# Patient Record
Sex: Female | Born: 1990 | Race: White | Hispanic: No | Marital: Single | Smoking: Current every day smoker
Health system: Southern US, Community
[De-identification: ages and names within clinical notes are randomized; demographics above are authoritative.]

## PROBLEM LIST (undated history)

## (undated) ENCOUNTER — Emergency Department (HOSPITAL_COMMUNITY): Admission: EM | Payer: Self-pay | Source: Home / Self Care

## (undated) DIAGNOSIS — K759 Inflammatory liver disease, unspecified: Secondary | ICD-10-CM

---
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
## 2010-07-09 LAB — WET PREP, GENITAL
Trich, Wet Prep: NONE SEEN
Yeast Wet Prep HPF POC: NONE SEEN

## 2010-07-09 LAB — GC/CHLAMYDIA PROBE AMP, GENITAL
Chlamydia, DNA Probe: NEGATIVE
GC Probe Amp, Genital: NEGATIVE

## 2010-07-10 LAB — AMYLASE: Amylase: 40 U/L (ref 27–131)

## 2010-07-10 LAB — CBC
HCT: 38.3 % (ref 36.0–46.0)
HCT: 43.2 % (ref 36.0–46.0)
Hemoglobin: 12.9 g/dL (ref 12.0–15.0)
Hemoglobin: 14.5 g/dL (ref 12.0–15.0)
MCHC: 33.6 g/dL (ref 30.0–36.0)
MCHC: 33.8 g/dL (ref 30.0–36.0)
MCV: 92.3 fL (ref 78.0–100.0)
RBC: 4.18 MIL/uL (ref 3.87–5.11)
RBC: 4.68 MIL/uL (ref 3.87–5.11)
RDW: 13.4 % (ref 11.5–15.5)

## 2010-07-10 LAB — POCT I-STAT, CHEM 8
Calcium, Ion: 1.09 mmol/L — ABNORMAL LOW (ref 1.12–1.32)
Chloride: 109 mEq/L (ref 96–112)
Glucose, Bld: 103 mg/dL — ABNORMAL HIGH (ref 70–99)
HCT: 45 % (ref 36.0–46.0)
Hemoglobin: 15.3 g/dL — ABNORMAL HIGH (ref 12.0–15.0)
TCO2: 21 mmol/L (ref 0–100)

## 2010-07-10 LAB — DIFFERENTIAL
Basophils Absolute: 0 10*3/uL (ref 0.0–0.1)
Basophils Relative: 0 % (ref 0–1)
Eosinophils Absolute: 0 10*3/uL (ref 0.0–0.7)
Eosinophils Relative: 0 % (ref 0–5)
Eosinophils Relative: 0 % (ref 0–5)
Lymphocytes Relative: 5 % — ABNORMAL LOW (ref 12–46)
Monocytes Absolute: 0.5 10*3/uL (ref 0.1–1.0)
Monocytes Absolute: 0.7 10*3/uL (ref 0.1–1.0)
Monocytes Relative: 3 % (ref 3–12)
Monocytes Relative: 3 % (ref 3–12)
Neutro Abs: 16.6 10*3/uL — ABNORMAL HIGH (ref 1.7–7.7)

## 2010-07-10 LAB — WET PREP, GENITAL
Trich, Wet Prep: NONE SEEN
WBC, Wet Prep HPF POC: NONE SEEN

## 2010-07-10 LAB — URINALYSIS, ROUTINE W REFLEX MICROSCOPIC
Bilirubin Urine: NEGATIVE
Ketones, ur: >80 mg/dL — AB
Nitrite: NEGATIVE
Protein, ur: 30 mg/dL — AB
pH: 6 (ref 5.0–8.0)

## 2010-07-10 LAB — HEPATIC FUNCTION PANEL
ALT: 10 U/L (ref 0–35)
AST: 20 U/L (ref 0–37)
Alkaline Phosphatase: 70 U/L (ref 39–117)
Indirect Bilirubin: 0.7 mg/dL (ref 0.3–0.9)
Total Protein: 8 g/dL (ref 6.0–8.3)

## 2010-07-10 LAB — POCT URINALYSIS DIP (DEVICE)
Glucose, UA: NEGATIVE mg/dL
Ketones, ur: >=160 mg/dL — AB
Specific Gravity, Urine: >=1.03 (ref 1.005–1.030)
Urobilinogen, UA: 1 mg/dL (ref 0.0–1.0)

## 2010-07-10 LAB — COMPREHENSIVE METABOLIC PANEL
ALT: 14 U/L (ref 0–35)
Alkaline Phosphatase: 65 U/L (ref 39–117)
BUN: 10 mg/dL (ref 6–23)
CO2: 22 mEq/L (ref 19–32)
GFR calc non Af Amer: >60 mL/min (ref >60)
Glucose, Bld: 89 mg/dL (ref 70–99)
Potassium: 3.7 mEq/L (ref 3.5–5.1)
Sodium: 144 mEq/L (ref 135–145)

## 2010-07-10 LAB — GC/CHLAMYDIA PROBE AMP, GENITAL
Chlamydia, DNA Probe: NEGATIVE
GC Probe Amp, Genital: NEGATIVE

## 2010-07-10 LAB — URINE MICROSCOPIC-ADD ON

## 2010-07-10 LAB — POCT PREGNANCY, URINE: Preg Test, Ur: NEGATIVE

## 2011-01-04 LAB — URINALYSIS, ROUTINE W REFLEX MICROSCOPIC
Bilirubin Urine: NEGATIVE
Ketones, ur: 15 mg/dL — AB
Nitrite: NEGATIVE
Protein, ur: NEGATIVE mg/dL
Urobilinogen, UA: 1 mg/dL (ref 0.0–1.0)

## 2011-01-04 LAB — POCT I-STAT, CHEM 8
Chloride: 104 mEq/L (ref 96–112)
Creatinine, Ser: 0.8 mg/dL (ref 0.4–1.2)
Hemoglobin: 15.3 g/dL (ref 12.0–16.0)
Potassium: 3.5 mEq/L (ref 3.5–5.1)
Sodium: 142 mEq/L (ref 135–145)

## 2011-01-04 LAB — URINE MICROSCOPIC-ADD ON

## 2011-05-15 ENCOUNTER — Emergency Department (HOSPITAL_COMMUNITY)
Admission: EM | Admit: 2011-05-15 | Discharge: 2011-05-15 | Disposition: A | Discharge status: Home / Self Care | Payer: Self-pay | Attending: Emergency Medicine | Admitting: Emergency Medicine

## 2011-05-15 ENCOUNTER — Encounter (HOSPITAL_COMMUNITY): Payer: Self-pay | Admitting: *Deleted

## 2011-05-15 DIAGNOSIS — R109 Unspecified abdominal pain: Secondary | ICD-10-CM | POA: Insufficient documentation

## 2011-05-15 DIAGNOSIS — R10819 Abdominal tenderness, unspecified site: Secondary | ICD-10-CM | POA: Insufficient documentation

## 2011-05-15 DIAGNOSIS — R197 Diarrhea, unspecified: Secondary | ICD-10-CM | POA: Insufficient documentation

## 2011-05-15 DIAGNOSIS — R112 Nausea with vomiting, unspecified: Secondary | ICD-10-CM | POA: Insufficient documentation

## 2011-05-15 LAB — URINALYSIS, ROUTINE W REFLEX MICROSCOPIC
Bilirubin Urine: NEGATIVE
Specific Gravity, Urine: 1.026 (ref 1.005–1.030)
pH: 5.5 (ref 5.0–8.0)

## 2011-05-15 LAB — BASIC METABOLIC PANEL
BUN: 12 mg/dL (ref 6–23)
CO2: 21 mEq/L (ref 19–32)
Chloride: 104 mEq/L (ref 96–112)
GFR calc Af Amer: >90 mL/min (ref >90)
Potassium: 3.8 mEq/L (ref 3.5–5.1)

## 2011-05-15 LAB — CBC
HCT: 39.2 % (ref 36.0–46.0)
Hemoglobin: 13.3 g/dL (ref 12.0–15.0)
MCHC: 33.9 g/dL (ref 30.0–36.0)
MCV: 92 fL (ref 78.0–100.0)
WBC: 14 10*3/uL — ABNORMAL HIGH (ref 4.0–10.5)

## 2011-05-15 LAB — URINE MICROSCOPIC-ADD ON

## 2011-05-15 LAB — DIFFERENTIAL
Lymphocytes Relative: 14 % (ref 12–46)
Monocytes Absolute: 0.6 10*3/uL (ref 0.1–1.0)
Monocytes Relative: 4 % (ref 3–12)
Neutro Abs: 11.2 10*3/uL — ABNORMAL HIGH (ref 1.7–7.7)

## 2011-05-15 MED ORDER — SODIUM CHLORIDE 0.9 % IV BOLUS (SEPSIS)
1000.0000 mL | Freq: Once | INTRAVENOUS | Status: AC
  Administered 2011-05-15: 1000 mL via INTRAVENOUS

## 2011-05-15 MED ORDER — ONDANSETRON HCL 4 MG/2ML IJ SOLN
4.0000 mg | Freq: Once | INTRAMUSCULAR | Status: AC
  Administered 2011-05-15: 4 mg via INTRAVENOUS
  Filled 2011-05-15: qty 2

## 2011-05-15 MED ORDER — OXYCODONE-ACETAMINOPHEN 5-325 MG PO TABS
1.0000 | ORAL_TABLET | Freq: Once | ORAL | Status: AC
  Administered 2011-05-15: 1 via ORAL
  Filled 2011-05-15: qty 1

## 2011-05-15 MED ORDER — KETOROLAC TROMETHAMINE 30 MG/ML IJ SOLN
30.0000 mg | Freq: Once | INTRAMUSCULAR | Status: AC
  Administered 2011-05-15: 30 mg via INTRAVENOUS
  Filled 2011-05-15: qty 1

## 2011-05-15 MED ORDER — ONDANSETRON HCL 4 MG PO TABS: 4.0000 mg | ORAL_TABLET | Freq: Four times a day (QID) | ORAL | Status: AC

## 2011-05-15 MED ORDER — ACETAMINOPHEN-CODEINE #3 300-30 MG PO TABS: 1.0000 | ORAL_TABLET | Freq: Four times a day (QID) | ORAL | Status: AC | PRN

## 2011-05-15 MED ORDER — IBUPROFEN 800 MG PO TABS: 800.0000 mg | ORAL_TABLET | Freq: Three times a day (TID) | ORAL | Status: AC

## 2011-05-15 NOTE — ED Notes (Signed)
Ambulatory to restroom

## 2011-05-15 NOTE — ED Notes (Signed)
Abd pain, NVD, acute onset this am. States symptoms similar and worse than adolescent dysmenorrhea

## 2011-05-15 NOTE — ED Provider Notes (Signed)
Medical screening examination/treatment/procedure(s) were performed by non-physician practitioner and as supervising physician I was immediately available for consultation/collaboration.   Vladislav Axelson, MD 05/15/11 1550 

## 2011-05-15 NOTE — ED Provider Notes (Signed)
History     CSN: [id number]  Arrival date & time 05/15/11  4098   First MD Initiated Contact with Patient 05/15/11 [phone]      Chief Complaint  Patient presents with  . Abdominal Pain    Abdominal pain radiating all over, patient pointing mainly to LUQ.  c/o "knot" to LUQ onset this am.  Patient states started menstrual cycle this am. NVD    (Consider location/radiation/quality/duration/timing/severity/associated sxs/prior treatment) HPI  Patient presents to ER complaining of acute onset lower abdominal cramping that woke her from her sleep at 4:30 am that was following by vomiting x 2 and one loose stool but ongoing lower abdominal cramping and the onset of her menstrual cycle this morning stating "I started my period a little later this morning." Patient states she has hx of similar abdominal cramping and vomiting past menstrual cycles but states "this cramping is more severe." denies point specific abdominal pain, fevers, chills, CP, SOB, dysuria, hematuria or blood in her stool. She denies vaginal d/c or pelvic pain. Patient took nothing for symptoms PTA. Denies aggravating or alleviating factors. She has no known medical problems and takes no meds on regular basis.   Past Medical History  Diagnosis Date  . Asthma     No past surgical history on file.  No family history on file.  History  Substance Use Topics  . Smoking status: Current Everyday Smoker -- 0.5 packs/day for 5 years    Types: Cigarettes  . Smokeless tobacco: Not on file  . Alcohol Use: No    OB History    Grav Para Term Preterm Abortions TAB SAB Ect Mult Living                  Review of Systems  All other systems reviewed and are negative.    Allergies  Review of patient's allergies indicates no known allergies.  Home Medications  No current outpatient prescriptions on file.  BP 114/58  Pulse 56  Temp(Src) 97.7 F (36.5 C) (Oral)  Resp 14  Ht 5\' 7"  (1.702 m)  Wt 140 lb (63.504 kg)  BMI  21.93 kg/m2  SpO2 100%  LMP 05/15/2011  Physical Exam  Nursing note and vitals reviewed. Constitutional: She is oriented to person, place, and time. She appears well-developed and well-nourished. No distress.  HENT:  Head: Normocephalic and atraumatic.  Eyes: Conjunctivae are normal.  Neck: Normal range of motion. Neck supple.  Cardiovascular: Normal rate, regular rhythm, normal heart sounds and intact distal pulses.  Exam reveals no gallop and no friction rub.   No murmur heard. Pulmonary/Chest: Effort normal and breath sounds normal. No respiratory distress. She has no wheezes. She has no rales. She exhibits no tenderness.  Abdominal: Bowel sounds are normal. She exhibits no distension and no mass. There is tenderness. There is no rebound and no guarding.       Mild TTP of entire lower abdomen but no guarding, rebound or rigidity.   Musculoskeletal: Normal range of motion. She exhibits no edema and no tenderness.  Neurological: She is alert and oriented to person, place, and time.  Skin: Skin is warm and dry. No rash noted. She is not diaphoretic. No erythema.  Psychiatric: She has a normal mood and affect.    ED Course  Procedures (including critical care time)  IV fluids, IV zofran and toradol.   Patient states she is feeling much better with some mild ongoing lower abdominal cramping but no point specific abdominal pain.  Labs Reviewed  CBC - Abnormal; Notable for the following:    WBC 14.0 (*)    All other components within normal limits  DIFFERENTIAL - Abnormal; Notable for the following:    Neutrophils Relative 81 (*)    Neutro Abs 11.2 (*)    All other components within normal limits  URINALYSIS, ROUTINE W REFLEX MICROSCOPIC - Abnormal; Notable for the following:    Hgb urine dipstick MODERATE (*)    Leukocytes, UA SMALL (*)    All other components within normal limits  URINE MICROSCOPIC-ADD ON - Abnormal; Notable for the following:    Squamous Epithelial / LPF FEW  (*)    Crystals CA OXALATE CRYSTALS (*)    All other components within normal limits  BASIC METABOLIC PANEL  POCT PREGNANCY, URINE   Patient is currently menstruating. Clean catch but question contamination based on UA  No results found.   1. Abdominal cramping   2. Nausea vomiting and diarrhea       MDM  Question of abdominal cramping with n/v/d related to menstruation vs gastroenteritis but patient is afebrile with an non acute, non tender abdomen. VSS. Non toxic appearing and tolerating fluids well. Spoke at length with patient about changing or worsening of symptoms that should warrant return to ER and she voices her understanding.         [place], [place] 05/15/11 1049

## 2011-05-15 NOTE — Discharge Instructions (Signed)
Take ibuprofen for pain, zofran for nausea and tylenol #3 for break through pain. Stay well hydrated with plenty of water throughout the day. Return to ER for changing or worsening of symptoms. Follow a bland diet for the next 48 hours.   Diet for Diarrhea, Adult Having frequent, runny stools (diarrhea) has many causes. Diarrhea may be caused or worsened by food or drink. Diarrhea may be relieved by changing your diet. IF YOU ARE NOT TOLERATING SOLID FOODS:  Drink enough water and fluids to keep your urine clear or pale yellow.   Avoid sugary drinks and sodas as well as milk-based beverages.   Avoid beverages containing caffeine and alcohol.   You may try rehydrating beverages. You can make your own by following this recipe:    tsp table salt.    tsp baking soda.   ? tsp salt substitute (potassium chloride).   1 tbs + 1 tsp sugar.   1 qt water.  As your stools become more solid, you can start eating solid foods. Add foods one at a time. If a certain food causes your diarrhea to get worse, avoid that food and try other foods. A low fiber, low-fat, and lactose-free diet is recommended. Small, frequent meals may be better tolerated.  Starches  Allowed:  White, [place], and pita breads, plain rolls, buns, bagels. Plain muffins, matzo. Soda, saltine, or graham crackers. Pretzels, melba toast, zwieback. Cooked cereals made with water: cornmeal, farina, cream cereals. Dry cereals: refined corn, wheat, rice. Potatoes prepared any way without skins, refined macaroni, spaghetti, noodles, refined rice.   Avoid:  Bread, rolls, or crackers made with whole wheat, multi-grains, rye, bran seeds, nuts, or coconut. Corn tortillas or taco shells. Cereals containing whole grains, multi-grains, bran, coconut, nuts, or raisins. Cooked or dry oatmeal. Coarse wheat cereals, granola. Cereals advertised as "high-fiber." Potato skins. Whole grain pasta, wild or brown rice. Popcorn. Sweet potatoes/yams. Sweet  rolls, doughnuts, waffles, pancakes, sweet breads.  Vegetables  Allowed: Strained tomato and vegetable juices. Most well-cooked and canned vegetables without seeds. Fresh: Tender lettuce, cucumber without the skin, cabbage, spinach, bean sprouts.   Avoid: Fresh, cooked, or canned: Artichokes, baked beans, beet greens, broccoli, Brussels sprouts, corn, kale, legumes, peas, sweet potatoes. Cooked: Green or red cabbage, spinach. Avoid large servings of any vegetables, because vegetables shrink when cooked, and they contain more fiber per serving than fresh vegetables.  Fruit  Allowed: All fruit juices except prune juice. Cooked or canned: Apricots, applesauce, cantaloupe, cherries, fruit cocktail, grapefruit, grapes, kiwi, mandarin oranges, peaches, pears, plums, watermelon. Fresh: Apples without skin, ripe banana, grapes, cantaloupe, cherries, grapefruit, peaches, oranges, plums. Keep servings limited to  cup or 1 piece.   Avoid: Fresh: Apple with skin, apricots, mango, pears, raspberries, strawberries. Prune juice, stewed or dried prunes. Dried fruits, raisins, dates. Large servings of all fresh fruits.  Meat and Meat Substitutes  Allowed: Ground or well-cooked tender beef, ham, veal, lamb, pork, or poultry. Eggs, plain cheese. Fish, oysters, shrimp, lobster, other seafoods. Liver, organ meats.   Avoid: Tough, fibrous meats with gristle. Peanut butter, smooth or chunky. Cheese, nuts, seeds, legumes, dried peas, beans, lentils.  Milk  Allowed: Yogurt, lactose-free milk, kefir, drinkable yogurt, buttermilk, soy milk.   Avoid: Milk, chocolate milk, beverages made with milk, such as milk shakes.  Soups  Allowed: Bouillon, broth, or soups made from allowed foods. Any strained soup.   Avoid: Soups made from vegetables that are not allowed, cream or milk-based soups.  Desserts and Sweets  Allowed: Sugar-free gelatin, sugar-free frozen ice pops made without sugar alcohol.   Avoid: Plain cakes  and cookies, pie made with allowed fruit, pudding, custard, cream pie. Gelatin, fruit, ice, sherbet, frozen ice pops. Ice cream, ice milk without nuts. Plain hard candy, honey, jelly, molasses, syrup, sugar, chocolate syrup, gumdrops, marshmallows.  Fats and Oils  Allowed: Avoid any fats and oils.   Avoid: Seeds, nuts, olives, avocados. Margarine, butter, cream, mayonnaise, salad oils, plain salad dressings made from allowed foods. Plain gravy, crisp bacon without rind.  Beverages  Allowed: Water, decaffeinated teas, oral rehydration solutions, sugar-free beverages.   Avoid: Fruit juices, caffeinated beverages (coffee, tea, soda or pop), alcohol, sports drinks, or lemon-lime soda or pop.  Condiments  Allowed: Ketchup, mustard, horseradish, vinegar, cream sauce, cheese sauce, cocoa powder. Spices in moderation: allspice, basil, bay leaves, celery powder or leaves, cinnamon, cumin powder, curry powder, ginger, mace, marjoram, onion or garlic powder, oregano, paprika, parsley flakes, ground pepper, rosemary, sage, savory, tarragon, thyme, turmeric.   Avoid: Coconut, honey.  Weight Monitoring: Weigh yourself every day. You should weigh yourself in the morning after you urinate and before you eat breakfast. Wear the same amount of clothing when you weigh yourself. Record your weight daily. Bring your recorded weights to your clinic visits. Tell your caregiver right away if you have gained 3 lb/1.4 kg or more in 1 day, 5 lb/2.3 kg in a week, or whatever amount you were told to report. SEEK IMMEDIATE MEDICAL CARE IF:   You are unable to keep fluids down.   You start to throw up (vomit) or diarrhea keeps coming back (persistent).   Abdominal pain develops, increases, or can be felt in one place (localizes).   You have an oral temperature above 102 F (38.9 C), not controlled by medicine.   Diarrhea contains blood or mucus.   You develop excessive weakness, dizziness, fainting, or extreme  thirst.  MAKE SURE YOU:   Understand these instructions.   Will watch your condition.   Will get help right away if you are not doing well or get worse.  Document Released: 06/08/2003 Document Revised: 11/28/2010 Document Reviewed: 09/29/2008 [facility] Patient Information 2012 [place], [place].  Abdominal Pain Abdominal pain can be caused by many things. Your caregiver decides the seriousness of your pain by an examination and possibly blood tests and X-rays. Many cases can be observed and treated at home. Most abdominal pain is not caused by a disease and will probably improve without treatment. However, in many cases, more time must pass before a clear cause of the pain can be found. Before that point, it may not be known if you need more testing, or if hospitalization or surgery is needed. HOME CARE INSTRUCTIONS   Do not take laxatives unless directed by your caregiver.   Take pain medicine only as directed by your caregiver.   Only take over-the-counter or prescription medicines for pain, discomfort, or fever as directed by your caregiver.   Try a clear liquid diet (broth, tea, or water) for as long as directed by your caregiver. Slowly move to a bland diet as tolerated.  SEEK IMMEDIATE MEDICAL CARE IF:   The pain does not go away.   You have a fever.   You keep throwing up (vomiting).   The pain is felt only in portions of the abdomen. Pain in the right side could possibly be appendicitis. In an adult, pain in the left lower portion of the abdomen could be colitis or diverticulitis.  You pass bloody or black tarry stools.  MAKE SURE YOU:   Understand these instructions.   Will watch your condition.   Will get help right away if you are not doing well or get worse.  Document Released: 12/26/2004 Document Revised: 11/28/2010 Document Reviewed: 11/04/2007 [facility] Patient Information 2012 [place], [place].

## 2011-08-01 ENCOUNTER — Emergency Department (HOSPITAL_COMMUNITY)
Admission: EM | Admit: 2011-08-01 | Discharge: 2011-08-01 | Discharge status: Against Medical Advice | Payer: Self-pay | Attending: Emergency Medicine | Admitting: Emergency Medicine

## 2011-08-01 ENCOUNTER — Encounter (HOSPITAL_COMMUNITY): Payer: Self-pay | Admitting: *Deleted

## 2011-08-01 DIAGNOSIS — R109 Unspecified abdominal pain: Secondary | ICD-10-CM | POA: Insufficient documentation

## 2011-08-01 NOTE — ED Notes (Signed)
Pt is here with lower mid abdominal cramping and nausea and vomiting since this am.  Pt vomiting in triage.

## 2011-11-01 ENCOUNTER — Encounter (HOSPITAL_COMMUNITY): Payer: Self-pay | Admitting: Emergency Medicine

## 2011-11-01 DIAGNOSIS — R109 Unspecified abdominal pain: Secondary | ICD-10-CM | POA: Insufficient documentation

## 2011-11-01 LAB — CBC
HCT: 39 % (ref 36.0–46.0)
MCV: 90.5 fL (ref 78.0–100.0)
RDW: 13.7 % (ref 11.5–15.5)
WBC: 15.4 10*3/uL — ABNORMAL HIGH (ref 4.0–10.5)

## 2011-11-01 LAB — URINALYSIS, ROUTINE W REFLEX MICROSCOPIC
Glucose, UA: NEGATIVE mg/dL
Hgb urine dipstick: NEGATIVE
Protein, ur: NEGATIVE mg/dL

## 2011-11-01 LAB — COMPREHENSIVE METABOLIC PANEL
BUN: 6 mg/dL (ref 6–23)
CO2: 26 mEq/L (ref 19–32)
Chloride: 103 mEq/L (ref 96–112)
Creatinine, Ser: 0.56 mg/dL (ref 0.50–1.10)
GFR calc Af Amer: >90 mL/min (ref >90)
GFR calc non Af Amer: >90 mL/min (ref >90)
Total Bilirubin: 0.4 mg/dL (ref 0.3–1.2)

## 2011-11-01 LAB — URINE MICROSCOPIC-ADD ON

## 2011-11-01 LAB — POCT PREGNANCY, URINE: Preg Test, Ur: NEGATIVE

## 2011-11-01 NOTE — ED Notes (Signed)
C/o abd pain, nausea, vomiting, and diarrhea since 3pm today.  Abd pressure when she urinates.

## 2011-11-02 ENCOUNTER — Emergency Department (HOSPITAL_COMMUNITY)
Admission: EM | Admit: 2011-11-02 | Discharge: 2011-11-02 | Discharge status: Against Medical Advice | Payer: Self-pay | Attending: Emergency Medicine | Admitting: Emergency Medicine

## 2011-11-02 NOTE — ED Notes (Signed)
Attempted to call pt again with no response.

## 2011-11-02 NOTE — ED Notes (Signed)
Called with no answer by [facility] EMT

## 2011-12-25 ENCOUNTER — Emergency Department (HOSPITAL_COMMUNITY)
Admission: EM | Admit: 2011-12-25 | Discharge: 2011-12-25 | Disposition: A | Discharge status: Home / Self Care | Payer: Self-pay | Attending: Emergency Medicine | Admitting: Emergency Medicine

## 2011-12-25 ENCOUNTER — Encounter (HOSPITAL_COMMUNITY): Payer: Self-pay | Admitting: Adult Health

## 2011-12-25 DIAGNOSIS — F17200 Nicotine dependence, unspecified, uncomplicated: Secondary | ICD-10-CM | POA: Insufficient documentation

## 2011-12-25 DIAGNOSIS — Y93G1 Activity, food preparation and clean up: Secondary | ICD-10-CM | POA: Insufficient documentation

## 2011-12-25 DIAGNOSIS — S61209A Unspecified open wound of unspecified finger without damage to nail, initial encounter: Secondary | ICD-10-CM | POA: Insufficient documentation

## 2011-12-25 DIAGNOSIS — J45909 Unspecified asthma, uncomplicated: Secondary | ICD-10-CM | POA: Insufficient documentation

## 2011-12-25 DIAGNOSIS — W268XXA Contact with other sharp object(s), not elsewhere classified, initial encounter: Secondary | ICD-10-CM | POA: Insufficient documentation

## 2011-12-25 DIAGNOSIS — IMO0002 Reserved for concepts with insufficient information to code with codable children: Secondary | ICD-10-CM

## 2011-12-25 NOTE — ED Notes (Signed)
Pt refused xray, MD informed stated pt was ok to refuse.

## 2011-12-25 NOTE — ED Notes (Signed)
1 inch laceration to left lateral pointer finger happened today while washing dishes. Tetanus up to date, bleeding controlled

## 2011-12-25 NOTE — ED Notes (Signed)
Pt washing dishes, cut hand on knife. Bleeding controlled. 1 in lac noted to left 2nd finger. Pt tetanus UTD

## 2011-12-25 NOTE — ED Provider Notes (Signed)
History  This chart was scribed for Glynn Octave, MD by Ardeen Jourdain. This patient was seen in room TR06C/TR06C and the patient's care was started at 1915.  CSN: [id number]  Arrival date & time 12/25/11  1805   None     Chief Complaint  Patient presents with  . Laceration     The history is provided by the patient. No language interpreter was used.    Kathryn Medina is a 21 y.o. female who presents to the Emergency Department complaining of a laceration to left lateral pointer finger. She states it happened today while she was cleaning dishes and broke a glass mug which cut her hand. The bleeding has stopped. She denies any other injuries. She states that her tetanus shot is up to date. She is a current everyday smoker but denies alcohol use.    Past Medical History  Diagnosis Date  . Asthma     History reviewed. No pertinent past surgical history.  History reviewed. No pertinent family history.  History  Substance Use Topics  . Smoking status: Current Every Day Smoker -- 0.5 packs/day for 5 years    Types: Cigarettes  . Smokeless tobacco: Not on file  . Alcohol Use: No   No OB history available.   Review of Systems  A complete 10 system review of systems was obtained and all systems are negative except as noted in the HPI and PMH.    Allergies  Review of patient's allergies indicates no known allergies.  Home Medications   Current Outpatient Rx  Name Route Sig Dispense Refill  . ACETAMINOPHEN 500 MG PO TABS Oral Take 500 mg by mouth every 6 (six) hours as needed. For pain      Triage Vitals: BP 100/51  Pulse 60  Temp 98.5 F (36.9 C) (Oral)  Resp 16  SpO2 97%  Physical Exam  Nursing note and vitals reviewed. Constitutional: She is oriented to person, place, and time. She appears well-developed and well-nourished. No distress.  HENT:  Head: Normocephalic and atraumatic.  Eyes: EOM are normal.  Neck: Normal range of motion. Neck supple. No  tracheal deviation present.  Cardiovascular: Normal rate.   Pulmonary/Chest: Effort normal. No respiratory distress.  Musculoskeletal: Normal range of motion.  Neurological: She is alert and oriented to person, place, and time.  Skin: Skin is warm and dry.       2 cm elipitcal laceration to radial index finger at MCP. FDS and FDP functional and intact. Distal sensation intact  Psychiatric: She has a normal mood and affect. Her behavior is normal.    ED Course  Procedures (including critical care time)  DIAGNOSTIC STUDIES: Oxygen Saturation is 97% on room air, adequate by my interpretation.    COORDINATION OF CARE:  31- Discussed treatment plan with pt at bedside and pt agreed to plan.   1934- Suture cart was ordered.      Labs Reviewed - No data to display No results found.   No diagnosis found.    MDM  Laceration to L radial index finger.  FDS, FDP, extension and distal sensation intact. Tetanus UTD.    Patient refuses Xray to evaluate for foreign body. She states there were "big chunks" of glass and they didn't get in wound.  Wound extensively probed and explored and no FBs found.  Wound repair, tetanus UTD.  LACERATION REPAIR Performed by: Glynn Octave Authorized by: Glynn Octave Consent: Verbal consent obtained. Risks and benefits: risks, benefits and alternatives were discussed  Consent given by: patient Patient identity confirmed: provided demographic data Prepped and Draped in normal sterile fashion Wound explored  Laceration Location: L index  Laceration Length: 2cm  No Foreign Bodies seen or palpated  Anesthesia: local infiltration  Local anesthetic: lidocaine 2% with epinephrine  Anesthetic total: 4 ml  Irrigation method: syringe Amount of cleaning: standard  Skin closure: 4-0 nylon  Number of sutures: 5  Technique: interrupted  Patient tolerance: Patient tolerated the procedure well with no immediate  complications.      I personally performed the services described in this documentation, which was scribed in my presence.  The recorded information has been reviewed and considered.    Glynn Octave, MD 12/25/11 2016

## 2011-12-25 NOTE — ED Notes (Signed)
Pt d/c home in NAD. Pt voiced understanding of d/c instructions and follow up care.  

## 2012-03-22 ENCOUNTER — Ambulatory Visit (HOSPITAL_COMMUNITY)
Admission: AD | Admit: 2012-03-22 | Discharge: 2012-03-22 | Disposition: A | Discharge status: Home / Self Care | Payer: Medicaid Other | Source: Ambulatory Visit | Attending: Obstetrics | Admitting: Obstetrics

## 2012-03-22 ENCOUNTER — Encounter (HOSPITAL_COMMUNITY): Payer: Self-pay

## 2012-03-22 ENCOUNTER — Inpatient Hospital Stay (HOSPITAL_COMMUNITY)
Admission: AD | Admit: 2012-03-22 | Discharge: 2012-03-22 | Disposition: A | Discharge status: Home / Self Care | Payer: Medicaid Other | Source: Ambulatory Visit | Attending: Obstetrics | Admitting: Obstetrics

## 2012-03-22 ENCOUNTER — Encounter (HOSPITAL_COMMUNITY): Payer: Self-pay | Admitting: *Deleted

## 2012-03-22 ENCOUNTER — Encounter (HOSPITAL_COMMUNITY): Payer: Self-pay | Admitting: Advanced Practice Midwife

## 2012-03-22 ENCOUNTER — Inpatient Hospital Stay (HOSPITAL_COMMUNITY): Payer: Medicaid Other

## 2012-03-22 ENCOUNTER — Ambulatory Visit: Admit: 2012-03-22 | Payer: Self-pay | Admitting: Obstetrics

## 2012-03-22 ENCOUNTER — Encounter (HOSPITAL_COMMUNITY)
Admission: AD | Disposition: A | Discharge status: Home / Self Care | Payer: Self-pay | Source: Ambulatory Visit | Attending: Obstetrics

## 2012-03-22 DIAGNOSIS — O021 Missed abortion: Secondary | ICD-10-CM | POA: Insufficient documentation

## 2012-03-22 DIAGNOSIS — IMO0002 Reserved for concepts with insufficient information to code with codable children: Secondary | ICD-10-CM

## 2012-03-22 DIAGNOSIS — O200 Threatened abortion: Secondary | ICD-10-CM

## 2012-03-22 HISTORY — PX: DILATION AND EVACUATION: SHX1459

## 2012-03-22 LAB — CBC
HCT: 35.2 % — ABNORMAL LOW (ref 36.0–46.0)
Hemoglobin: 11.9 g/dL — ABNORMAL LOW (ref 12.0–15.0)
MCH: 30.6 pg (ref 26.0–34.0)
MCH: 31.3 pg (ref 26.0–34.0)
MCHC: 33.8 g/dL (ref 30.0–36.0)
MCV: 91.3 fL (ref 78.0–100.0)
Platelets: 166 10*3/uL (ref 150–400)
RDW: 13.2 % (ref 11.5–15.5)

## 2012-03-22 LAB — ABO/RH: ABO/RH(D): O POS

## 2012-03-22 SURGERY — DILATION AND EVACUATION, UTERUS
Anesthesia: Monitor Anesthesia Care

## 2012-03-22 MED ORDER — CITRIC ACID-SODIUM CITRATE 334-500 MG/5ML PO SOLN
30.0000 mL | Freq: Once | ORAL | Status: AC
  Administered 2012-03-22: 30 mL via ORAL

## 2012-03-22 MED ORDER — KETOROLAC TROMETHAMINE 30 MG/ML IJ SOLN
30.0000 mg | Freq: Once | INTRAMUSCULAR | Status: DC
Start: 2012-03-22 — End: 2012-03-22

## 2012-03-22 MED ORDER — SODIUM CHLORIDE 0.9 % IR SOLN
Status: DC | PRN
  Administered 2012-03-22: 1000 mL

## 2012-03-22 MED ORDER — LACTATED RINGERS IV SOLN
INTRAVENOUS | Status: DC
Start: 2012-03-22 — End: 2012-03-22
  Administered 2012-03-22: 125 mL/h via INTRAVENOUS
  Administered 2012-03-22: 11:00:00 via INTRAVENOUS

## 2012-03-22 MED ORDER — ACETAMINOPHEN-CODEINE 300-30 MG PO TABS: 1.0000 | ORAL_TABLET | ORAL | Status: DC | PRN

## 2012-03-22 MED ORDER — LACTATED RINGERS IV SOLN: INTRAVENOUS | Status: DC

## 2012-03-22 MED ORDER — HYDROMORPHONE HCL PF 1 MG/ML IJ SOLN: 0.2000 mg | INTRAMUSCULAR | Status: DC | PRN

## 2012-03-22 MED ORDER — LIDOCAINE HCL 1 % IJ SOLN
INTRAMUSCULAR | Status: DC | PRN
  Administered 2012-03-22: 20 mL

## 2012-03-22 MED ORDER — FENTANYL CITRATE 0.05 MG/ML IJ SOLN
INTRAMUSCULAR | Status: DC | PRN
  Administered 2012-03-22: 25 ug via INTRAVENOUS

## 2012-03-22 MED ORDER — HYDROMORPHONE HCL PF 1 MG/ML IJ SOLN
1.0000 mg | Freq: Once | INTRAMUSCULAR | Status: AC
  Administered 2012-03-22: 1 mg via INTRAVENOUS
  Filled 2012-03-22: qty 1

## 2012-03-22 MED ORDER — ONDANSETRON HCL 4 MG/2ML IJ SOLN
INTRAMUSCULAR | Status: DC | PRN
  Administered 2012-03-22: 4 mg via INTRAVENOUS

## 2012-03-22 MED ORDER — CITRIC ACID-SODIUM CITRATE 334-500 MG/5ML PO SOLN
ORAL | Status: AC
  Administered 2012-03-22: 30 mL via ORAL
  Filled 2012-03-22: qty 15

## 2012-03-22 MED ORDER — METHYLERGONOVINE MALEATE 0.2 MG/ML IJ SOLN
INTRAMUSCULAR | Status: DC | PRN
  Administered 2012-03-22: 0.2 mg via INTRAMUSCULAR

## 2012-03-22 MED ORDER — LIDOCAINE HCL 1 % IJ SOLN
INTRAMUSCULAR | Status: DC | PRN
  Administered 2012-03-22: 10 mL

## 2012-03-22 MED ORDER — PROMETHAZINE HCL 12.5 MG PO TABS: 12.5000 mg | ORAL_TABLET | Freq: Four times a day (QID) | ORAL | Status: DC | PRN

## 2012-03-22 MED ORDER — IBUPROFEN 600 MG PO TABS: 600.0000 mg | ORAL_TABLET | Freq: Four times a day (QID) | ORAL | Status: DC | PRN

## 2012-03-22 MED ORDER — DEXAMETHASONE SODIUM PHOSPHATE 4 MG/ML IJ SOLN
INTRAMUSCULAR | Status: DC | PRN
  Administered 2012-03-22: 10 mg via INTRAVENOUS

## 2012-03-22 MED ORDER — PROMETHAZINE HCL 25 MG/ML IJ SOLN
12.5000 mg | Freq: Once | INTRAMUSCULAR | Status: AC
  Administered 2012-03-22: 12.5 mg via INTRAVENOUS
  Filled 2012-03-22: qty 1

## 2012-03-22 MED ORDER — FENTANYL CITRATE 0.05 MG/ML IJ SOLN: 25.0000 ug | INTRAMUSCULAR | Status: DC | PRN

## 2012-03-22 MED ORDER — PROPOFOL 10 MG/ML IV EMUL
INTRAVENOUS | Status: DC | PRN
  Administered 2012-03-22 (×2): 50 mg via INTRAVENOUS
  Administered 2012-03-22: 40 mg via INTRAVENOUS
  Administered 2012-03-22 (×2): 50 mg via INTRAVENOUS
  Administered 2012-03-22: 30 mg via INTRAVENOUS
  Administered 2012-03-22: 40 mg via INTRAVENOUS
  Administered 2012-03-22: 50 mg via INTRAVENOUS
  Administered 2012-03-22: 40 mg via INTRAVENOUS

## 2012-03-22 MED ORDER — MIDAZOLAM HCL 5 MG/5ML IJ SOLN
INTRAMUSCULAR | Status: DC | PRN
  Administered 2012-03-22 (×2): 1 mg via INTRAVENOUS

## 2012-03-22 MED ORDER — LACTATED RINGERS IV SOLN
INTRAVENOUS | Status: DC | PRN
  Administered 2012-03-22 (×2): via INTRAVENOUS

## 2012-03-22 MED ORDER — LACTATED RINGERS IV SOLN
INTRAVENOUS | Status: DC
  Administered 2012-03-22: 06:00:00 via INTRAVENOUS

## 2012-03-22 MED ORDER — HYDROCODONE-ACETAMINOPHEN 5-500 MG PO TABS: 1.0000 | ORAL_TABLET | Freq: Four times a day (QID) | ORAL | Status: DC | PRN

## 2012-03-22 MED ORDER — NALBUPHINE HCL 10 MG/ML IJ SOLN
10.0000 mg | Freq: Once | INTRAMUSCULAR | Status: AC
  Administered 2012-03-22: 10 mg via INTRAVENOUS
  Filled 2012-03-22: qty 1

## 2012-03-22 MED ORDER — OXYTOCIN 40 UNITS IN LACTATED RINGERS INFUSION - SIMPLE MED
INTRAVENOUS | Status: DC | PRN
  Administered 2012-03-22: 40 [IU] via INTRAVENOUS

## 2012-03-22 SURGICAL SUPPLY — 20 items
CATH ROBINSON RED A/P 16FR (CATHETERS) ×2 IMPLANT
CLOTH BEACON ORANGE TIMEOUT ST (SAFETY) ×2 IMPLANT
DECANTER SPIKE VIAL GLASS SM (MISCELLANEOUS) ×2 IMPLANT
GLOVE BIO SURGEON STRL SZ8 (GLOVE) ×4 IMPLANT
GOWN STRL REIN XL XLG (GOWN DISPOSABLE) ×4 IMPLANT
KIT BERKELEY 1ST TRIMESTER 3/8 (MISCELLANEOUS) ×2 IMPLANT
NEEDLE HYPO 25X1 1.5 SAFETY (NEEDLE) IMPLANT
NEEDLE SPNL 22GX3.5 QUINCKE BK (NEEDLE) ×2 IMPLANT
NS IRRIG 1000ML POUR BTL (IV SOLUTION) ×2 IMPLANT
PACK VAGINAL MINOR WOMEN LF (CUSTOM PROCEDURE TRAY) ×2 IMPLANT
PAD OB MATERNITY 4.3X12.25 (PERSONAL CARE ITEMS) ×2 IMPLANT
PAD PREP 24X48 CUFFED NSTRL (MISCELLANEOUS) ×2 IMPLANT
SET BERKELEY SUCTION TUBING (SUCTIONS) ×2 IMPLANT
SYR CONTROL 10ML LL (SYRINGE) ×2 IMPLANT
TOWEL OR 17X24 6PK STRL BLUE (TOWEL DISPOSABLE) ×4 IMPLANT
VACURETTE 10 RIGID CVD (CANNULA) ×2 IMPLANT
VACURETTE 12 RIGID CVD (CANNULA) ×2 IMPLANT
VACURETTE 7MM CVD STRL WRAP (CANNULA) IMPLANT
VACURETTE 8 RIGID CVD (CANNULA) IMPLANT
VACURETTE 9 RIGID CVD (CANNULA) IMPLANT

## 2012-03-22 NOTE — MAU Provider Note (Signed)
History     CSN: [id number]  Arrival date and time: 03/22/12 1023   None     Chief Complaint  Patient presents with  . Abdominal Pain  . Miscarriage   HPI 21 y.o. G1P0 at [redacted] weeks EGA by LMP with known SAB in progress, per patient's mother the "baby stopped growing" around [redacted] weeks EGA. Seen early this morning in MAU d/t increased pain, received IV pain meds and antiemetic, sent home with tylenol #3 with instructions to f/u in office as scheduled tomorrow. Pain increased again at home, bleeding slightly more but not heavy at this time.    Past Medical History  Diagnosis Date  . Asthma     No past surgical history on file.  No family history on file.  History  Substance Use Topics  . Smoking status: Current Every Day Smoker -- 0.5 packs/day for 5 years    Types: Cigarettes  . Smokeless tobacco: Not on file  . Alcohol Use: No    Allergies: No Known Allergies  Prescriptions prior to admission  Medication Sig Dispense Refill  . Acetaminophen-Codeine (TYLENOL/CODEINE #3) 300-30 MG per tablet Take 1 tablet by mouth every 4 (four) hours as needed for pain.  30 tablet  0  . ibuprofen (ADVIL,MOTRIN) 600 MG tablet Take 600 mg by mouth every 6 (six) hours as needed. For pain      . Prenatal Vit-Fe Fumarate-FA (PRENATAL MULTIVITAMIN) TABS Take 1 tablet by mouth daily.      . promethazine (PHENERGAN) 12.5 MG tablet Take 12.5 mg by mouth every 6 (six) hours as needed. For nausea        Review of Systems  Constitutional: Negative.   Respiratory: Negative.   Cardiovascular: Negative.   Gastrointestinal: Positive for nausea, vomiting and abdominal pain. Negative for diarrhea and constipation.  Genitourinary: Negative for dysuria, urgency, frequency, hematuria and flank pain.       Positive vaginal bleeding   Musculoskeletal: Negative.   Neurological: Negative.   Psychiatric/Behavioral: Negative.    Physical Exam   Blood pressure 109/67, pulse 58, temperature 96.9 F (36.1  C), temperature source Oral, resp. rate 18, last menstrual period 10/16/2011.  Physical Exam  Nursing note and vitals reviewed. Constitutional: She is oriented to person, place, and time. She appears well-developed and well-nourished. She appears distressed.  Cardiovascular: Normal rate.   Respiratory: Effort normal.  Genitourinary: There is bleeding (moderate) around the vagina.       Cervix closed  Musculoskeletal: Normal range of motion.  Neurological: She is alert and oriented to person, place, and time.  Skin: Skin is warm and dry.  Psychiatric: She has a normal mood and affect.    MAU Course  Procedures  Results for orders placed during the hospital encounter of 03/22/12 (from the past 24 hour(s))  CBC     Status: Abnormal   Collection Time   03/22/12 11:06 AM      Component Value Range   WBC 17.5 (*) 4.0 - 10.5 K/uL   RBC 4.03  3.87 - 5.11 MIL/uL   Hemoglobin 12.6  12.0 - 15.0 g/dL   HCT 65.7  84.6 - 96.2 %   MCV 91.3  78.0 - 100.0 fL   MCH 31.3  26.0 - 34.0 pg   MCHC 34.2  30.0 - 36.0 g/dL   RDW 95.2  84.1 - 32.4 %   Platelets 166  150 - 400 K/uL     Assessment and Plan  Dr. Clearance Coots to MAU for  eval - pt to OR for D&E  Sotero Brinkmeyer 03/22/2012, 11:43 AM

## 2012-03-22 NOTE — Discharge Instructions (Signed)
Threatened Miscarriage  A threatened miscarriage is a pregnancy that may end. It may be marked by bleeding during the first 20 weeks of pregnancy. Often, the pregnancy can continue without any more problems. You may be asked to stop:  Having sex (intercourse).  Having orgasms.  Using tampons.  Exercising.  Doing heavy physical activity and work. HOME CARE   Your doctor may tell you to take bed rest and to stop activities and work.  Write down the number of pads you use each day. Write down how often you change pads. Write down how soaked they are.  Follow your doctor's advice for follow-up visits and tests.  If your blood type is Rh-negative and the father's blood is Rh-positive (or is not known), you may get a shot to protect the baby.  If you have a miscarriage, save all the tissue you pass in a container. Take the container to your doctor. GET HELP RIGHT AWAY IF:   You have bad cramps or pain in your belly (abdomen), lower belly, or back.  You have a fever or chills.  Your bleeding gets worse or you pass large clots of blood or tissue. Save this tissue to show your doctor.  You feel lightheaded, weak, dizzy, or pass out (faint).  You have a gush of fluid from your vagina. MAKE SURE YOU:   Understand these instructions.  Will watch your condition.  Will get help right away if you are not doing well or get worse. Document Released: 02/29/2008 Document Revised: 06/10/2011 Document Reviewed: 04/03/2009 [facility] Patient Information 2013 [place], [place].

## 2012-03-22 NOTE — MAU Note (Signed)
Pt presents with severe abdominal pain since 2100 and bleeding.  Pt to have d and c 12/26

## 2012-03-22 NOTE — MAU Note (Signed)
Here earlier told she was misacarring. Went home pain got worse. Pt in distress. Moaning and very uncomfortable. Pt vomited several times on the way to hospital. N.Fraizier at bedside  IVF and pain meds ordered

## 2012-03-22 NOTE — Anesthesia Postprocedure Evaluation (Signed)
  Anesthesia Post-op Note  Patient: Kathryn Medina  Procedure(s) Performed: Procedure(s) (LRB) with comments: DILATATION AND EVACUATION (N/A) Patient is awake and responsive. Pain and nausea are reasonably well controlled. Vital signs are stable and clinically acceptable. Oxygen saturation is clinically acceptable. There are no apparent anesthetic complications at this time. Patient is ready for discharge.

## 2012-03-22 NOTE — Transfer of Care (Signed)
Immediate Anesthesia Transfer of Care Note  Patient: Kathryn Medina  Procedure(s) Performed: Procedure(s) (LRB) with comments: DILATATION AND EVACUATION (N/A)  Patient Location: PACU  Anesthesia Type:MAC  Level of Consciousness: awake, alert  and oriented  Airway & Oxygen Therapy: Patient Spontanous Breathing and Patient connected to nasal cannula oxygen  Post-op Assessment: Report given to PACU RN and Post -op Vital signs reviewed and stable  Post vital signs: Reviewed and stable  Complications: No apparent anesthesia complications

## 2012-03-22 NOTE — MAU Provider Note (Signed)
  History     CSN: [id number]  Arrival date and time: 03/22/12 0458   None     Chief Complaint  Patient presents with  . Miscarriage   HPI  Pt is a G1P0 in with report of increased nausea and vaginal bleeding that started last night.  Pain is rated an 8/10.  Pt seen at [facility] and diagnosed with an fetal demise at 10 wks, pt is currently doing expectant management.  Follow-up appointment is scheduled at [facility] on 03/26/12.    Past Medical History  Diagnosis Date  . Asthma     History reviewed. No pertinent past surgical history.  History reviewed. No pertinent family history.  History  Substance Use Topics  . Smoking status: Current Every Day Smoker -- 0.5 packs/day for 5 years    Types: Cigarettes  . Smokeless tobacco: Not on file  . Alcohol Use: No    Allergies: No Known Allergies  Prescriptions prior to admission  Medication Sig Dispense Refill  . acetaminophen (TYLENOL) 500 MG tablet Take 500 mg by mouth every 6 (six) hours as needed. For pain        Review of Systems  Gastrointestinal: Positive for nausea, vomiting and abdominal pain.  Genitourinary:       Vaginal bleeding  All other systems reviewed and are negative.   Physical Exam   Blood pressure 125/68, pulse 68, temperature 98 F (36.7 C), temperature source Oral, resp. rate 20, last menstrual period 10/16/2011.  Physical Exam  Constitutional: She is oriented to person, place, and time. She appears well-developed and well-nourished.  HENT:  Head: Normocephalic.  Eyes: Pupils are equal, round, and reactive to light.  Neck: Normal range of motion. Neck supple.  Cardiovascular: Normal rate, regular rhythm and normal heart sounds.   Respiratory: Effort normal and breath sounds normal.  GI: Soft. There is tenderness (midpelvic).  Genitourinary: There is bleeding (scant, no clots) around the vagina.  Neurological: She is alert and oriented to person, place, and time. She has normal reflexes.  Skin:  Skin is warm and dry.    MAU Course  Procedures IV LR IV Phenergan 12.5 mg/Nubain 25 mg IV  Results for orders placed during the hospital encounter of 03/22/12 (from the past 24 hour(s))  CBC     Status: Abnormal   Collection Time   03/22/12  5:25 AM      Component Value Range   WBC 10.9 (*) 4.0 - 10.5 K/uL   RBC 3.89  3.87 - 5.11 MIL/uL   Hemoglobin 11.9 (*) 12.0 - 15.0 g/dL   HCT 40.9 (*) 81.1 - 91.4 %   MCV 90.5  78.0 - 100.0 fL   MCH 30.6  26.0 - 34.0 pg   MCHC 33.8  30.0 - 36.0 g/dL   RDW 78.2  95.6 - 21.3 %   Platelets 187  150 - 400 K/uL   Consulted with Dr. Clearance Coots > requested if vitals and CBC stable have pt follow-up in office tomorrow.  Assessment and Plan  Failed Pregnancy - 1st Trimester  Plan: Follow-up in office tomorrow. RX Tylenol#3; Ibuprofen Phenergan 12.mg PO Bleeding Precautions  [facility] 03/22/2012, 6:09 AM

## 2012-03-22 NOTE — Anesthesia Preprocedure Evaluation (Signed)
Anesthesia Evaluation  Patient identified by MRN, date of birth, ID band Patient awake    Reviewed: Allergy & Precautions, H&P , Patient's Chart, lab work & pertinent test results, reviewed documented beta blocker date and time   Airway Mallampati: II TM Distance: >3 FB Neck ROM: full    Dental No notable dental hx.    Pulmonary  breath sounds clear to auscultation  Pulmonary exam normal       Cardiovascular Rhythm:regular Rate:Normal     Neuro/Psych    GI/Hepatic   Endo/Other    Renal/GU      Musculoskeletal   Abdominal   Peds  Hematology   Anesthesia Other Findings   Reproductive/Obstetrics                           Anesthesia Physical Anesthesia Plan  ASA: II  Anesthesia Plan: General and MAC   Post-op Pain Management:    Induction: Intravenous  Airway Management Planned: LMA and Mask  Additional Equipment:   Intra-op Plan:   Post-operative Plan:   Informed Consent: I have reviewed the patients History and Physical, chart, labs and discussed the procedure including the risks, benefits and alternatives for the proposed anesthesia with the patient or authorized representative who has indicated his/her understanding and acceptance.   Dental Advisory Given  Plan Discussed with: CRNA and Surgeon  Anesthesia Plan Comments: (  Discussed MAC or LMA/ general anesthesia, including possible nausea, instrumentation of airway, sore throat,pulmonary aspiration, etc. I asked if the were any outstanding questions, or  concerns before we proceeded. )        Anesthesia Quick Evaluation

## 2012-03-22 NOTE — H&P (Signed)
Kathryn Medina is a 21 y.o. female presenting for cramping and vaginal bleeding.. Maternal Medical History:  Reason for admission: Reason for admission: contractions.  21 yo G1.  10 week IUFD on U/S in office this past week.  Presents with cramping and bleeding.  Contractions: Onset was 3-5 hours ago.   Frequency: regular.    Prenatal complications: no prenatal complications   OB History    Grav Para Term Preterm Abortions TAB SAB Ect Mult Living   1              Past Medical History  Diagnosis Date  . Asthma    No past surgical history on file. Family History: family history is not on file. Social History:  reports that she has been smoking Cigarettes.  She has a 2.5 pack-year smoking history. She does not have any smokeless tobacco history on file. She reports that she does not drink alcohol or use illicit drugs.   Prenatal Transfer Tool    Review of Systems  All other systems reviewed and are negative.      Blood pressure 109/67, pulse 58, temperature 96.9 F (36.1 C), temperature source Oral, resp. rate 18, last menstrual period 10/16/2011. Maternal Exam:  Uterine Assessment: Contraction strength is firm.  Abdomen: Patient reports no abdominal tenderness. Introitus: Normal vulva. Normal vagina.  Cervix: Cervix evaluated by digital exam.     Physical Exam  Nursing note and vitals reviewed. Constitutional: She is oriented to person, place, and time. She appears well-developed and well-nourished.  HENT:  Head: Normocephalic and atraumatic.  Eyes: Conjunctivae normal are normal. Pupils are equal, round, and reactive to light.  Neck: Normal range of motion. Neck supple.  Cardiovascular: Normal rate and regular rhythm.   Respiratory: Effort normal and breath sounds normal.  GI: Soft.  Genitourinary: Vagina normal and uterus normal.  Musculoskeletal: Normal range of motion.  Neurological: She is alert and oriented to person, place, and time.  Skin: Skin is warm and  dry.  Psychiatric: She has a normal mood and affect. Her behavior is normal. Judgment and thought content normal.    Prenatal labs: ABO, Rh:   Antibody:   Rubella:   RPR:    HBsAg:    HIV:    GBS:     Assessment/Plan: 10 week IUFD.  Threatened abortion.  Will proceed with D&E.   Kathryn Medina A 03/22/2012, 11:59 AM

## 2012-03-22 NOTE — Op Note (Signed)
Preoperative diagnosis:10 week IUFD  Postoperative diagnosis: Same  Procedure: Suction dilatation and curretage Surgeon: Kym Fenter A  Anesthesia:Paracervical block with IV sedation  Estimated blood loss:  Urine output: per anesthesia  IV Fluids: per anesthesia  Complications: None  Specimen: Products of conception  Operative Findings:  Vaginal bleeding.  Description of procedure:   The patient was taken to the operating room and placed on the operating table in the semi-lithotomy position in [place] stirrups.  Examination under anesthesia was performed.  The patient was prepped and draped in the usual manner.  After a time-out had been completed, a speculum was placed in the vagina.  The anterior lip of the cervix was grasped with a single-toothed tenaculum.  A paracervical block was performed using 10 ml of 1% lidocaine.  The block was performed at 4 and 8 o'clock at the cervical vaginal junction. The cervix was dilated with Shawnie Pons dilators.  A 10 -mm suction curet was inserted in the uterine cavity.  The device was activated and the curet rotated to evacuate the products of conception.   All the instruments were removed from the vagina.  Final instrument counts were correct.  The patient was taken to the PACU in stable condition.

## 2012-03-23 ENCOUNTER — Encounter (HOSPITAL_COMMUNITY): Payer: Self-pay | Admitting: Obstetrics

## 2012-07-13 ENCOUNTER — Ambulatory Visit (INDEPENDENT_AMBULATORY_CARE_PROVIDER_SITE_OTHER): Payer: Medicaid Other | Admitting: *Deleted

## 2012-07-13 VITALS — BP 121/85 | HR 69 | Temp 98.1°F | Ht 67.0 in | Wt 115.0 lb

## 2012-07-13 DIAGNOSIS — Z3049 Encounter for surveillance of other contraceptives: Secondary | ICD-10-CM

## 2012-07-13 DIAGNOSIS — B810 Anisakiasis: Secondary | ICD-10-CM

## 2012-07-13 MED ORDER — MEDROXYPROGESTERONE ACETATE 150 MG/ML IM SUSP
150.0000 mg | INTRAMUSCULAR | Status: AC
  Administered 2012-07-13 – 2012-12-28 (×3): 150 mg via INTRAMUSCULAR

## 2012-07-13 NOTE — Progress Notes (Signed)
Pt in office for Depo Provera injection. She does reports some scant spotting when she wipes. Pt also thinks she may have BV and has requested treatment for that. RTO 10/04/2012 and sample of Clindesse given to patient- patient to call back if condition does not improve.

## 2012-07-27 ENCOUNTER — Ambulatory Visit: Payer: Managed Care, Other (non HMO) | Admitting: Obstetrics

## 2012-07-30 ENCOUNTER — Ambulatory Visit: Payer: Self-pay | Admitting: Obstetrics & Gynecology

## 2012-10-05 ENCOUNTER — Ambulatory Visit (INDEPENDENT_AMBULATORY_CARE_PROVIDER_SITE_OTHER): Payer: Medicaid Other | Admitting: Obstetrics

## 2012-10-05 ENCOUNTER — Encounter: Payer: Self-pay | Admitting: Obstetrics

## 2012-10-05 ENCOUNTER — Ambulatory Visit: Payer: Medicaid Other | Admitting: *Deleted

## 2012-10-05 VITALS — BP 128/81 | HR 79 | Temp 98.2°F | Wt 127.4 lb

## 2012-10-05 DIAGNOSIS — Z309 Encounter for contraceptive management, unspecified: Secondary | ICD-10-CM

## 2012-10-05 DIAGNOSIS — Z113 Encounter for screening for infections with a predominantly sexual mode of transmission: Secondary | ICD-10-CM

## 2012-10-05 DIAGNOSIS — Z3049 Encounter for surveillance of other contraceptives: Secondary | ICD-10-CM

## 2012-10-05 DIAGNOSIS — N760 Acute vaginitis: Secondary | ICD-10-CM

## 2012-10-05 NOTE — Progress Notes (Signed)
Pt tolerated well. Given Medroxyprogesterone Acetate 150 mg in right GM per pt's request. Pt to return 12/27/2012. Pt expressed understanding. Pt seen by Dr. Clearance Coots today for STD screen.

## 2012-10-05 NOTE — Progress Notes (Signed)
Subjective:    Kathryn Medina is a 22 y.o. female who presents for sexually transmitted disease check. Sexual history reviewed with the patient. STI Exposure: denies knowledge of risky exposure. Previous history of STI trichomonas. Current symptoms abnormal bleeding. Contraception: Depo-Provera injections  Menstrual History:  Menarche age: 58  Patient's last menstrual period was 10/01/2012.    The following portions of the patient's history were reviewed and updated as appropriate: allergies, current medications, past family history, past medical history, past social history, past surgical history and problem list.  Review of Systems Pertinent items are noted in HPI.     Objective:    BP 128/81  Pulse 79  Temp(Src) 98.2 F (36.8 C) (Oral)  Wt 127 lb 6.4 oz (57.788 kg)  BMI 19.95 kg/m2  LMP 10/01/2012  Breastfeeding? No General:   alert and no distress  Lymph Nodes:   not examined  Pelvis:  Vulva and vagina appear normal. Bimanual exam reveals normal uterus and adnexa.  Cultures:  GC and Chlamydia genprobes and wet prep done     Assessment:    Very low risk of STD exposure    Plan:    Discussed safe sexual practice in detail See orders for STD cultures and assays Pt will call for results RTC PRN

## 2012-10-05 NOTE — Patient Instructions (Addendum)
Please return to office as schedule and as needed. Please call pharmacy for refills and pick-up prescription before next appointment.  

## 2012-10-06 LAB — RPR

## 2012-10-06 LAB — GC/CHLAMYDIA PROBE AMP: CT Probe RNA: NEGATIVE

## 2012-10-06 LAB — WET PREP BY MOLECULAR PROBE: Gardnerella vaginalis: NEGATIVE

## 2012-10-06 LAB — HEPATITIS C ANTIBODY: HCV Ab: NEGATIVE

## 2012-10-06 LAB — HEPATITIS B SURFACE ANTIGEN: Hepatitis B Surface Ag: NEGATIVE

## 2012-10-06 LAB — HIV ANTIBODY (ROUTINE TESTING W REFLEX): HIV: NONREACTIVE

## 2012-12-28 ENCOUNTER — Ambulatory Visit (INDEPENDENT_AMBULATORY_CARE_PROVIDER_SITE_OTHER): Payer: Medicaid Other | Admitting: *Deleted

## 2012-12-28 VITALS — BP 129/78 | HR 76 | Temp 97.6°F | Ht 67.0 in | Wt 139.2 lb

## 2012-12-28 DIAGNOSIS — IMO0001 Reserved for inherently not codable concepts without codable children: Secondary | ICD-10-CM

## 2012-12-28 DIAGNOSIS — Z309 Encounter for contraceptive management, unspecified: Secondary | ICD-10-CM

## 2012-12-28 NOTE — Progress Notes (Signed)
Patient is here today for her depo injection.  Patient tolerated well and is to RTO office 03/21/13 for next injection.

## 2013-02-16 ENCOUNTER — Emergency Department (HOSPITAL_COMMUNITY)
Admission: EM | Admit: 2013-02-16 | Discharge: 2013-02-17 | Disposition: A | Discharge status: Home / Self Care | Payer: Self-pay | Attending: Emergency Medicine | Admitting: Emergency Medicine

## 2013-02-16 ENCOUNTER — Encounter (HOSPITAL_COMMUNITY): Payer: Self-pay | Admitting: Emergency Medicine

## 2013-02-16 DIAGNOSIS — J45909 Unspecified asthma, uncomplicated: Secondary | ICD-10-CM | POA: Insufficient documentation

## 2013-02-16 DIAGNOSIS — Z79899 Other long term (current) drug therapy: Secondary | ICD-10-CM | POA: Insufficient documentation

## 2013-02-16 DIAGNOSIS — F17200 Nicotine dependence, unspecified, uncomplicated: Secondary | ICD-10-CM | POA: Insufficient documentation

## 2013-02-16 DIAGNOSIS — L509 Urticaria, unspecified: Secondary | ICD-10-CM | POA: Insufficient documentation

## 2013-02-16 DIAGNOSIS — R000 Tachycardia, unspecified: Secondary | ICD-10-CM | POA: Insufficient documentation

## 2013-02-16 DIAGNOSIS — Z3202 Encounter for pregnancy test, result negative: Secondary | ICD-10-CM | POA: Insufficient documentation

## 2013-02-16 DIAGNOSIS — R61 Generalized hyperhidrosis: Secondary | ICD-10-CM | POA: Insufficient documentation

## 2013-02-16 DIAGNOSIS — R21 Rash and other nonspecific skin eruption: Secondary | ICD-10-CM | POA: Insufficient documentation

## 2013-02-16 DIAGNOSIS — R1110 Vomiting, unspecified: Secondary | ICD-10-CM | POA: Insufficient documentation

## 2013-02-16 LAB — COMPREHENSIVE METABOLIC PANEL
ALT: 13 U/L (ref 0–35)
Albumin: 4.5 g/dL (ref 3.5–5.2)
Alkaline Phosphatase: 83 U/L (ref 39–117)
Calcium: 9.6 mg/dL (ref 8.4–10.5)
GFR calc Af Amer: >90 mL/min (ref >90)
Glucose, Bld: 133 mg/dL — ABNORMAL HIGH (ref 70–99)
Potassium: 3.7 mEq/L (ref 3.5–5.1)
Sodium: 138 mEq/L (ref 135–145)
Total Protein: 7.9 g/dL (ref 6.0–8.3)

## 2013-02-16 LAB — CBC WITH DIFFERENTIAL/PLATELET
Basophils Absolute: 0 10*3/uL (ref 0.0–0.1)
Basophils Relative: 0 % (ref 0–1)
Eosinophils Absolute: 0.2 10*3/uL (ref 0.0–0.7)
Eosinophils Relative: 1 % (ref 0–5)
Lymphs Abs: 4.8 10*3/uL — ABNORMAL HIGH (ref 0.7–4.0)
MCH: 33.6 pg (ref 26.0–34.0)
MCHC: 35.6 g/dL (ref 30.0–36.0)
MCV: 94.3 fL (ref 78.0–100.0)
Neutrophils Relative %: 70 % (ref 43–77)
Platelets: 283 10*3/uL (ref 150–400)
RDW: 13.6 % (ref 11.5–15.5)

## 2013-02-16 LAB — POCT PREGNANCY, URINE: Preg Test, Ur: NEGATIVE

## 2013-02-16 MED ORDER — SODIUM CHLORIDE 0.9 % IV SOLN
INTRAVENOUS | Status: DC
  Administered 2013-02-17: 02:00:00 via INTRAVENOUS

## 2013-02-16 MED ORDER — SODIUM CHLORIDE 0.9 % IV BOLUS (SEPSIS)
1000.0000 mL | Freq: Once | INTRAVENOUS | Status: AC
  Administered 2013-02-16: 1000 mL via INTRAVENOUS

## 2013-02-16 MED ORDER — METHYLPREDNISOLONE SODIUM SUCC 125 MG IJ SOLR
125.0000 mg | Freq: Once | INTRAMUSCULAR | Status: AC
  Administered 2013-02-16: 125 mg via INTRAVENOUS
  Filled 2013-02-16: qty 2

## 2013-02-16 MED ORDER — METOPROLOL TARTRATE 1 MG/ML IV SOLN
2.5000 mg | Freq: Once | INTRAVENOUS | Status: DC
  Filled 2013-02-16: qty 5

## 2013-02-16 MED ORDER — DILTIAZEM HCL 25 MG/5ML IV SOLN
5.0000 mg | Freq: Once | INTRAVENOUS | Status: DC
  Filled 2013-02-16: qty 5

## 2013-02-16 MED ORDER — FAMOTIDINE IN NACL 20-0.9 MG/50ML-% IV SOLN
20.0000 mg | Freq: Once | INTRAVENOUS | Status: AC
  Administered 2013-02-16: 20 mg via INTRAVENOUS
  Filled 2013-02-16: qty 50

## 2013-02-16 MED ORDER — DIPHENHYDRAMINE HCL 50 MG/ML IJ SOLN
25.0000 mg | Freq: Once | INTRAMUSCULAR | Status: AC
  Administered 2013-02-16: 25 mg via INTRAVENOUS
  Filled 2013-02-16: qty 1

## 2013-02-16 MED ORDER — ONDANSETRON HCL 4 MG/2ML IJ SOLN
4.0000 mg | Freq: Once | INTRAMUSCULAR | Status: AC
  Administered 2013-02-16: 4 mg via INTRAVENOUS
  Filled 2013-02-16: qty 2

## 2013-02-16 MED ORDER — LORAZEPAM 2 MG/ML IJ SOLN
1.0000 mg | Freq: Once | INTRAMUSCULAR | Status: AC
  Administered 2013-02-16: 1 mg via INTRAVENOUS
  Filled 2013-02-16: qty 1

## 2013-02-16 NOTE — ED Provider Notes (Signed)
Medical screening examination/treatment/procedure(s) were conducted as a shared visit with non-physician practitioner(s) and myself.  I personally evaluated the patient during the encounter.  EKG Interpretation    Date/Time:  Tuesday February 16 2013 23:32:46 EST Ventricular Rate:  63 PR Interval:  126 QRS Duration: 81 QT Interval:  429 QTC Calculation: 439 R Axis:   90 Text Interpretation:  Sinus rhythm Borderline right axis deviation Low voltage, precordial leads sinus tach resolved Confirmed by Freida Busman  MD, Rahel Carlton ([id number]) on 02/16/2013 11:41:11 PM           Pt given iv fluids and meds for allergic reaction and rash improved--heart improved with fluids only--will continue to monitor and likely discharge    Toy Baker, MD 02/16/13 2342

## 2013-02-16 NOTE — ED Notes (Addendum)
Pt HR decreased to 65 and new EKG printed. Dr. Freida Busman made aware and given updated copy of EKG.

## 2013-02-16 NOTE — ED Notes (Signed)
Dr Allen at bedside  

## 2013-02-16 NOTE — ED Notes (Addendum)
Pt brought to POD A room 6. Pt appears red with hives throughout trunk and all extremities. Pts HR up to 170 bpm but maintained between 150-160 beats per minute. Verified with palpation.  Dr. Freida Busman notified and came to room to see patient.

## 2013-02-16 NOTE — ED Provider Notes (Signed)
CSN: [id number]     Arrival date & time 02/16/13  2053 History   First MD Initiated Contact with Patient 02/16/13 2113     Chief Complaint  Patient presents with  . Emesis  . Rash   (Consider location/radiation/quality/duration/timing/severity/associated sxs/prior Treatment) Patient is a 22 y.o. female presenting with vomiting and rash. The history is provided by the patient.  Emesis Rash   Patient reports 5 episodes of vomiting lasting less than 1 hour and started approximately 30 minutes after eating "ham and scalloped potatoes". Patient suspects the food caused the emesis. Patient denies hematemisis. Patient accompanied by friends who are at bedside. Friends claim to have eaten same meal without any similar symptoms. Patient reports having diaphoresis and  diffuse rash described as "Hives" that she developed just before vomiting, but has since resolved  Denies abdominal pain, fever, and chills. Patient Admits to previously having some shortness of breath and wheezing during her episodes of vomiting but has now resolved. Denies any swelling of throat or tongue.    Past Medical History  Diagnosis Date  . Asthma    Past Surgical History  Procedure Laterality Date  . Dilation and evacuation  03/22/2012    Procedure: DILATATION AND EVACUATION;  Surgeon: Brock Bad, MD;  Location: [facility];  Service: Gynecology;  Laterality: N/A;   Family History  Problem Relation Age of Onset  . Cancer Paternal Grandfather   . Cancer Paternal Grandmother   . Emphysema Maternal Grandmother   . Hypertension Father    History  Substance Use Topics  . Smoking status: Current Every Day Smoker -- 0.50 packs/day for 5 years    Types: Cigarettes  . Smokeless tobacco: Never Used     Comment: pt is trying to use the electronic cigarette  . Alcohol Use: No     Comment: social   OB History   Grav Para Term Preterm Abortions TAB SAB Ect Mult Living   1 0 0 0 1 0 1 0 0 0      Review of Systems   HENT: Negative for trouble swallowing.   Respiratory: Negative for cough, choking and stridor.   Cardiovascular: Negative for chest pain.  Genitourinary: Negative for dysuria, flank pain and difficulty urinating.  Musculoskeletal: Negative for back pain and neck stiffness.  Skin: Positive for rash.  Allergic/Immunologic: Negative for food allergies.    Allergies  Review of patient's allergies indicates no known allergies.  Home Medications   Current Outpatient Rx  Name  Route  Sig  Dispense  Refill  . medroxyPROGESTERone (DEPO-PROVERA) 150 MG/ML injection   Intramuscular   Inject 150 mg into the muscle every 3 (three) months.         . Prenatal Vit-Fe Fumarate-FA (PRENATAL MULTIVITAMIN) TABS   Oral   Take 1 tablet by mouth daily.          BP 100/59  Pulse 63  Temp(Src) 97.6 F (36.4 C) (Oral)  Resp 20  Ht 5\' 7"  (1.702 m)  Wt 132 lb 4 oz (59.988 kg)  BMI 20.71 kg/m2  SpO2 98%  LMP 02/15/2013 Physical Exam  Constitutional: She is oriented to person, place, and time. She appears well-developed and well-nourished.  HENT:  Head: Normocephalic and atraumatic.  Eyes: Pupils are equal, round, and reactive to light.  Cardiovascular: Regular rhythm and normal pulses.  Tachycardia present.  Exam reveals no gallop and no friction rub.   No murmur heard. Pulmonary/Chest: Effort normal and breath sounds normal. No stridor. She has  no wheezes. She has no rhonchi. She has no rales.  Abdominal: Soft. Bowel sounds are normal. She exhibits no distension. There is no CVA tenderness, no tenderness at McBurney's point and negative Murphy's sign.  Mild tenderness to deep palpation of LUQ. No guarding, No rebound tenderness, No peritoneal signs.   Musculoskeletal: Normal range of motion. She exhibits no edema.  Lymphadenopathy:    She has no cervical adenopathy.  Neurological: She is alert and oriented to person, place, and time.  Skin: Skin is warm and dry. No rash noted. She is not  diaphoretic.  Psychiatric: She has a normal mood and affect. Her behavior is normal.    ED Course  Procedures (including critical care time) Labs Review Labs Reviewed  CBC WITH DIFFERENTIAL - Abnormal; Notable for the following:    WBC 19.6 (*)    Hemoglobin 16.6 (*)    HCT 46.6 (*)    Neutro Abs 13.7 (*)    Lymphs Abs 4.8 (*)    All other components within normal limits  COMPREHENSIVE METABOLIC PANEL - Abnormal; Notable for the following:    Glucose, Bld 133 (*)    All other components within normal limits  URINE RAPID DRUG SCREEN (HOSP PERFORMED) - Abnormal; Notable for the following:    Opiates POSITIVE (*)    Tetrahydrocannabinol POSITIVE (*)    All other components within normal limits  URINE CULTURE  CULTURE, BLOOD (ROUTINE X 2)  CULTURE, BLOOD (ROUTINE X 2)  LIPASE, BLOOD  ETHANOL  URINALYSIS, ROUTINE W REFLEX MICROSCOPIC  POCT PREGNANCY, URINE  CG4 I-STAT (LACTIC ACID)   Imaging Review No results found.  EKG Interpretation    Date/Time:  Tuesday February 16 2013 23:32:46 EST Ventricular Rate:  63 PR Interval:  126 QRS Duration: 81 QT Interval:  429 QTC Calculation: 439 R Axis:   90 Text Interpretation:  Sinus rhythm Borderline right axis deviation Low voltage, precordial leads sinus tach resolved Confirmed by Freida Busman  MD, ANTHONY ([id number]) on 02/16/2013 11:41:11 PM            MDM  No diagnosis found.  Patient presents with emesis and rash. Rash has since resolved. Patient denies dyspnea, throat swelling, tongue swelling, and wheezing. Lung fields are clear bilaterally. Patient denies any nausea at this time. Patient is found to be tachycardic on exam with no appreciable murmur. ECG reveals sinus tachycardia that resolved with administration of IV fluids. Repeat ECG shows normal sinus rhythm and rate. CBC shows an elevated WBC count of 19.6 and tested positive for THC and Opiates. Will get a CXR, Blood cultures, Urine Culture, UA, and Lactate. Pending results  patient will be discharged home as long as all tests show no significant findings.     Rudene Anda, PA-C 02/17/13 [phone]

## 2013-02-16 NOTE — ED Notes (Signed)
After administration of IV solumedrol and benadryl, pts HR decreased to 79 beats per minute at 2135 but Pts HR is now steady at 135 beats per minute.

## 2013-02-16 NOTE — ED Notes (Signed)
Pt reports in last 30 minutes she started to have n/v/d and rash/itching all over her body; pt denies allergies; does report having ham and scalloped potatoes right before this happened; reports sob when originally happened but reports it is better

## 2013-02-16 NOTE — ED Notes (Signed)
Pt ambulated to restroom and back to room with slow, steady gait. 

## 2013-02-17 ENCOUNTER — Emergency Department (HOSPITAL_COMMUNITY): Payer: Self-pay

## 2013-02-17 LAB — URINALYSIS, ROUTINE W REFLEX MICROSCOPIC
Bilirubin Urine: NEGATIVE
Glucose, UA: NEGATIVE mg/dL
Hgb urine dipstick: NEGATIVE
Ketones, ur: 15 mg/dL — AB
Leukocytes, UA: NEGATIVE
Nitrite: NEGATIVE
Specific Gravity, Urine: 1.026 (ref 1.005–1.030)
pH: 6 (ref 5.0–8.0)

## 2013-02-17 LAB — RAPID URINE DRUG SCREEN, HOSP PERFORMED
Amphetamines: NOT DETECTED
Barbiturates: NOT DETECTED
Benzodiazepines: NOT DETECTED
Cocaine: NOT DETECTED

## 2013-02-17 LAB — URINE MICROSCOPIC-ADD ON

## 2013-02-17 MED ORDER — SODIUM CHLORIDE 0.9 % IV BOLUS (SEPSIS)
1000.0000 mL | Freq: Once | INTRAVENOUS | Status: AC
  Administered 2013-02-17: 1000 mL via INTRAVENOUS

## 2013-02-17 MED ORDER — PROMETHAZINE HCL 25 MG PO TABS: 25.0000 mg | ORAL_TABLET | Freq: Four times a day (QID) | ORAL | Status: DC | PRN

## 2013-02-17 MED ORDER — EPINEPHRINE 0.3 MG/0.3ML IJ SOAJ: 0.3000 mg | INTRAMUSCULAR | Status: DC | PRN

## 2013-02-17 NOTE — ED Provider Notes (Signed)
Pt received from [place], [place].  Pt presented to ED w/ vomiting and urticaria and was tachycardic on initial exam.  Labs sig for leukocytosis.  CXR negative for pna and U/A shows bacteruria, but patient has not had any urinary sx and UTI unlikely.  Culture pending.  Urticaria treated w/ solumedrol, pepcid and benadryl and currently resolved.  Has not had any vomiting since receiving IV zofran and is tolerating pos.  Has no complaints currently and VSS.  D/c'd home w/ zofran, an epi-pen in case this was an anaphylactic reaction and referral to primary care.  Return precautions discussed. 2:41 AM    Otilio Miu, PA-C 02/17/13 [phone]

## 2013-02-17 NOTE — ED Notes (Addendum)
Pt A&Ox4, ambulatory with slow, steady gait upon discharge.

## 2013-02-18 LAB — URINE CULTURE: Colony Count: 25000

## 2013-02-19 NOTE — ED Provider Notes (Signed)
Medical screening examination/treatment/procedure(s) were performed by non-physician practitioner and as supervising physician I was immediately available for consultation/collaboration.  EKG Interpretation    Date/Time:  Tuesday February 16 2013 23:32:46 EST Ventricular Rate:  63 PR Interval:  126 QRS Duration: 81 QT Interval:  429 QTC Calculation: 439 R Axis:   90 Text Interpretation:  Sinus rhythm Borderline right axis deviation Low voltage, precordial leads sinus tach resolved Confirmed by Freida Busman  MD, Dorsey Charette ([id number]) on 02/16/2013 11:41:11 PM             Toy Baker, MD 02/19/13 1544

## 2013-02-19 NOTE — ED Provider Notes (Signed)
Medical screening examination/treatment/procedure(s) were performed by non-physician practitioner and as supervising physician I was immediately available for consultation/collaboration.  EKG Interpretation    Date/Time:  Tuesday February 16 2013 23:32:46 EST Ventricular Rate:  63 PR Interval:  126 QRS Duration: 81 QT Interval:  429 QTC Calculation: 439 R Axis:   90 Text Interpretation:  Sinus rhythm Borderline right axis deviation Low voltage, precordial leads sinus tach resolved Confirmed by Pat Sires  MD, Nykeem Citro ([id number]) on 02/16/2013 11:41:11 PM             Moroni Nester T Yanni Ruberg, MD 02/19/13 1544 

## 2013-02-23 LAB — CULTURE, BLOOD (ROUTINE X 2)
Culture: NO GROWTH
Culture: NO GROWTH

## 2013-03-22 ENCOUNTER — Other Ambulatory Visit: Payer: Self-pay | Admitting: Obstetrics

## 2013-03-22 ENCOUNTER — Ambulatory Visit: Payer: Medicaid Other | Admitting: *Deleted

## 2013-03-22 ENCOUNTER — Ambulatory Visit: Payer: Medicaid Other

## 2013-03-22 MED ORDER — MEDROXYPROGESTERONE ACETATE 150 MG/ML IM SUSP: 150.0000 mg | INTRAMUSCULAR | Status: DC

## 2014-01-25 ENCOUNTER — Emergency Department (HOSPITAL_COMMUNITY)
Admission: EM | Admit: 2014-01-25 | Discharge: 2014-01-25 | Disposition: A | Discharge status: Home / Self Care | Payer: Medicaid Other | Attending: Emergency Medicine | Admitting: Emergency Medicine

## 2014-01-25 ENCOUNTER — Encounter (HOSPITAL_COMMUNITY): Payer: Self-pay | Admitting: Emergency Medicine

## 2014-01-25 DIAGNOSIS — Z720 Tobacco use: Secondary | ICD-10-CM | POA: Insufficient documentation

## 2014-01-25 DIAGNOSIS — K088 Other specified disorders of teeth and supporting structures: Secondary | ICD-10-CM | POA: Insufficient documentation

## 2014-01-25 DIAGNOSIS — J45909 Unspecified asthma, uncomplicated: Secondary | ICD-10-CM | POA: Insufficient documentation

## 2014-01-25 DIAGNOSIS — K029 Dental caries, unspecified: Secondary | ICD-10-CM

## 2014-01-25 DIAGNOSIS — K0889 Other specified disorders of teeth and supporting structures: Secondary | ICD-10-CM

## 2014-01-25 MED ORDER — HYDROCODONE-ACETAMINOPHEN 7.5-325 MG/15ML PO SOLN: 10.0000 mL | Freq: Four times a day (QID) | ORAL | Status: DC | PRN

## 2014-01-25 MED ORDER — IBUPROFEN 800 MG PO TABS: 800.0000 mg | ORAL_TABLET | Freq: Three times a day (TID) | ORAL | Status: DC | PRN

## 2014-01-25 MED ORDER — PENICILLIN V POTASSIUM 500 MG PO TABS: 500.0000 mg | ORAL_TABLET | Freq: Four times a day (QID) | ORAL | Status: AC

## 2014-01-25 NOTE — ED Provider Notes (Signed)
CSN: [id number]     Arrival date & time 01/25/14  1359 History  This chart was scribed for non-physician practitioner, Trixie DredgeEmily Goldye Tourangeau, PA-C working with Flint MelterElliott L Wentz, MD by Greggory StallionKayla Andersen, ED scribe. This patient was seen in room TR09C/TR09C and the patient's care was started at 2:22 PM.    Chief Complaint  Patient presents with  . Dental Pain   The history is provided by the patient. No language interpreter was used.   HPI Comments: Kathryn Medina is a 23 y.o. female who presents to the Emergency Department complaining of sharp, aching left sided dental pain that worsened and became constant 2 days ago. Reports mild resolving facial swelling. States she had 3 episodes of emesis earlier today but is unsure why. Pt has had intermittent pain over the last 2-3 years since a filling came out. Pressure over the tooth worsens the pain. She has used Anbesol and taken [facility] powders with no relief. Denies fever, chills, sore throat, trouble swallowing, difficulty breathing, abdominal pain, diarrhea, dysuria, urinary frequency, urgency. Pt does not have a dentist.   Past Medical History  Diagnosis Date  . Asthma    Past Surgical History  Procedure Laterality Date  . Dilation and evacuation  03/22/2012    Procedure: DILATATION AND EVACUATION;  Surgeon: Brock Badharles A Harper, MD;  Location: [facility];  Service: Gynecology;  Laterality: N/A;   Family History  Problem Relation Age of Onset  . Cancer Paternal Grandfather   . Cancer Paternal Grandmother   . Emphysema Maternal Grandmother   . Hypertension Father    History  Substance Use Topics  . Smoking status: Current Every Day Smoker -- 0.50 packs/day for 5 years    Types: Cigarettes  . Smokeless tobacco: Never Used     Comment: pt is trying to use the electronic cigarette  . Alcohol Use: No     Comment: social   OB History   Grav Para Term Preterm Abortions TAB SAB Ect Mult Living   1 0 0 0 1 0 1 0 0 0      Review of Systems  Constitutional:  Negative for fever and chills.  HENT: Positive for dental problem and facial swelling. Negative for sore throat and trouble swallowing.   Gastrointestinal: Negative for nausea and vomiting.  All other systems reviewed and are negative.  Allergies  Review of patient's allergies indicates no known allergies.  Home Medications   Prior to Admission medications   Not on File   BP 133/88  Pulse 107  Temp(Src) 98.4 F (36.9 C) (Oral)  Resp 18  SpO2 97%  Physical Exam  Nursing note and vitals reviewed. Constitutional: She appears well-developed and well-nourished. No distress.  HENT:  Head: Normocephalic and atraumatic.  Mouth/Throat: Oropharynx is clear and moist.  Severe decay in last left lower molar. Tender to percussion. No facial swelling.   Neck: Neck supple. No tracheal tenderness present. No tracheal deviation present.  Cardiovascular: Normal rate and regular rhythm.   Pulmonary/Chest: Effort normal and breath sounds normal. No stridor. No respiratory distress. She has no wheezes. She has no rales.  Lymphadenopathy:    She has no cervical adenopathy.  Neurological: She is alert.  Skin: She is not diaphoretic.    ED Course  Procedures (including critical care time)  DIAGNOSTIC STUDIES: Oxygen Saturation is 97% on RA, normal by my interpretation.    COORDINATION OF CARE: 2:26 PM-Discussed treatment plan which includes pain medication and an antibiotic with pt at bedside and  pt agreed to plan. Will give pt dental referrals and advised her to follow up.   Labs Review Labs Reviewed - No data to display  Imaging Review No results found.   EKG Interpretation None      MDM   Final diagnoses:  Dental decay  Pain, dental    Afebrile, nontoxic patient with new dental pain.  No obvious abscess.  No concerning findings on exam.  Doubt deep space head or neck infection.  Doubt Ludwig's angina.  D/C home with antibiotic, pain medication and dental follow up.   Discussed findings, treatment, and follow up  with patient.  Pt given return precautions.  Pt verbalizes understanding and agrees with plan.      I personally performed the services described in this documentation, which was scribed in my presence. The recorded information has been reviewed and is accurate.   Trixie Dredgemily Ermine Spofford, PA-C 01/25/14 1515

## 2014-01-25 NOTE — Discharge Instructions (Signed)
Read the information below.  Use the prescribed medication as directed.  Please discuss all new medications with your pharmacist.  Do not take additional tylenol while taking the prescribed pain medication to avoid overdose.  You may return to the Emergency Department at any time for worsening condition or any new symptoms that concern you.    If there is any possibility that you might be pregnant, please let your health care provider know and discuss this with the pharmacist to ensure medication safety.  Please call the dentist listed above within 48 hours to schedule a close follow up appointment.  If you develop fevers, swelling in your face, difficulty swallowing or breathing, return to the ER immediately for a recheck.   ° ° ° °Dental Caries °Dental caries (also called tooth decay) is the most common oral disease. It can occur at any age but is more common in children and young adults.  °HOW DENTAL CARIES DEVELOPS  °The process of decay begins when bacteria and foods (particularly sugars and starches) combine in your mouth to produce plaque. Plaque is a substance that sticks to the hard, outer surface of a tooth (enamel). The bacteria in plaque produce acids that attack enamel. These acids may also attack the root surface of a tooth (cementum) if it is exposed. Repeated attacks dissolve these surfaces and create holes in the tooth (cavities). If left untreated, the acids destroy the other layers of the tooth.  °RISK FACTORS °· Frequent sipping of sugary beverages.   °· Frequent snacking on sugary and starchy foods, especially those that easily get stuck in the teeth.   °· Poor oral hygiene.   °· Dry mouth.   °· Substance abuse such as methamphetamine abuse.   °· Broken or poor-fitting dental restorations.   °· Eating disorders.   °· Gastroesophageal reflux disease (GERD).   °· Certain radiation treatments to the head and neck. °SYMPTOMS °In the early stages of dental caries, symptoms are seldom present.  Sometimes white, chalky areas may be seen on the enamel or other tooth layers. In later stages, symptoms may include: °· Pits and holes on the enamel. °· Toothache after sweet, hot, or cold foods or drinks are consumed. °· Pain around the tooth. °· Swelling around the tooth. °DIAGNOSIS  °Most of the time, dental caries is detected during a regular dental checkup. A diagnosis is made after a thorough medical and dental history is taken and the surfaces of your teeth are checked for signs of dental caries. Sometimes special instruments, such as lasers, are used to check for dental caries. Dental X-ray exams may be taken so that areas not visible to the eye (such as between the contact areas of the teeth) can be checked for cavities.  °TREATMENT  °If dental caries is in its early stages, it may be reversed with a fluoride treatment or an application of a remineralizing agent at the dental office. Thorough brushing and flossing at home is needed to aid these treatments. If it is in its later stages, treatment depends on the location and extent of tooth destruction:  °· If a small area of the tooth has been destroyed, the destroyed area will be removed and cavities will be filled with a material such as gold, silver amalgam, or composite resin.   °· If a large area of the tooth has been destroyed, the destroyed area will be removed and a cap (crown) will be fitted over the remaining tooth structure.   °· If the center part of the tooth (pulp) is affected, a procedure   called a root canal will be needed before a filling or crown can be placed.   °· If most of the tooth has been destroyed, the tooth may need to be pulled (extracted). °HOME CARE INSTRUCTIONS °You can prevent, stop, or reverse dental caries at home by practicing good oral hygiene. Good oral hygiene includes: °· Thoroughly cleaning your teeth at least twice a day with a toothbrush and dental floss.   °· Using a fluoride toothpaste. A fluoride mouth rinse may  also be used if recommended by your dentist or health care provider.   °· Restricting the amount of sugary and starchy foods and sugary liquids you consume.   °· Avoiding frequent snacking on these foods and sipping of these liquids.   °· Keeping regular visits with a dentist for checkups and cleanings. °PREVENTION  °· Practice good oral hygiene. °· Consider a dental sealant. A dental sealant is a coating material that is applied by your dentist to the pits and grooves of teeth. The sealant prevents food from being trapped in them. It may protect the teeth for several years. °· Ask about fluoride supplements if you live in a community without fluorinated water or with water that has a low fluoride content. Use fluoride supplements as directed by your dentist or health care provider. °· Allow fluoride varnish applications to teeth if directed by your dentist or health care provider. °Document Released: 12/08/2001 Document Revised: 08/02/2013 Document Reviewed: 03/20/2012 °ExitCare® Patient Information ©2015 [employer]. This information is not intended to replace advice given to you by your health care provider. Make sure you discuss any questions you have with your health care provider. ° °Dental Pain °A tooth ache may be caused by cavities (tooth decay). Cavities expose the nerve of the tooth to air and hot or cold temperatures. It may come from an infection or abscess (also called a boil or furuncle) around your tooth. It is also often caused by dental caries (tooth decay). This causes the pain you are having. °DIAGNOSIS  °Your caregiver can diagnose this problem by exam. °TREATMENT  °· If caused by an infection, it may be treated with medications which kill germs (antibiotics) and pain medications as prescribed by your caregiver. Take medications as directed. °· Only take over-the-counter or prescription medicines for pain, discomfort, or fever as directed by your caregiver. °· Whether the tooth ache today is  caused by infection or dental disease, you should see your dentist as soon as possible for further care. °SEEK MEDICAL CARE IF: °The exam and treatment you received today has been provided on an emergency basis only. This is not a substitute for complete medical or dental care. If your problem worsens or new problems (symptoms) appear, and you are unable to meet with your dentist, call or return to this location. °SEEK IMMEDIATE MEDICAL CARE IF:  °· You have a fever. °· You develop redness and swelling of your face, jaw, or neck. °· You are unable to open your mouth. °· You have severe pain uncontrolled by pain medicine. °MAKE SURE YOU:  °· Understand these instructions. °· Will watch your condition. °· Will get help right away if you are not doing well or get worse. °Document Released: 03/18/2005 Document Revised: 06/10/2011 Document Reviewed: 11/04/2007 °ExitCare® Patient Information ©2015 [employer]. This information is not intended to replace advice given to you by your health care provider. Make sure you discuss any questions you have with your health care provider. ° ° °Emergency Department Resource Guide °1) Find a Doctor and   Pay Out of Pocket °Although you won't have to find out who is covered by your insurance plan, it is a good idea to ask around and get recommendations. You will then need to call the office and see if the doctor you have chosen will accept you as a new patient and what types of options they offer for patients who are self-pay. Some doctors offer discounts or will set up payment plans for their patients who do not have insurance, but you will need to ask so you aren't surprised when you get to your appointment. ° °2) Contact Your Local Health Department °Not all health departments have doctors that can see patients for sick visits, but many do, so it is worth a call to see if yours does. If you don't know where your local health department is, you can check in your phone book. The CDC  also has a tool to help you locate your state's health department, and many state websites also have listings of all of their local health departments. ° °3) Find a Walk-in Clinic °If your illness is not likely to be very severe or complicated, you may want to try a walk in clinic. These are popping up all over the country in pharmacies, drugstores, and shopping centers. They're usually staffed by nurse practitioners or physician assistants that have been trained to treat common illnesses and complaints. They're usually fairly quick and inexpensive. However, if you have serious medical issues or chronic medical problems, these are probably not your best option. ° °No Primary Care Doctor: °- Call [facility] at  832-8000 - they can help you locate a primary care doctor that  accepts your insurance, provides certain services, etc. °- Physician Referral Service- 1-800-533-3463 ° °Chronic Pain Problems: °Organization         Address  Phone   Notes  °[facility]  (336) 297-2271 Patients need to be referred by their primary care doctor.  ° °Medication Assistance: °Organization         Address  Phone   Notes  °[place] [facility] [address] °[place], [place] [address] (336) 641-8030 --Must be a resident of [place] °-- Must have NO insurance coverage whatsoever (no Medicaid/ Medicare, etc.) °-- The pt. MUST have a primary care doctor that directs their care regularly and follows them in the community °  °[facility]  (866) 331-1348   °[employer]  (888) 892-1162   ° °Agencies that provide inexpensive medical care: °Organization         Address  Phone   Notes  °[facility]  (336) 832-8035   °[facility]    (336) 832-7272   °[facility] [address] °[place], [place] [address] (336) 832-4777   °[facility] [address], °[place] (336) 271-4999   °[facility]    (336)  373-0678   °[facility]    (336) 272-1050   °[facility] ° [address], [place] Phone:  (336) 832-4444, Fax:  (336) 832-4440 Hours of Operation:  9 am - 6 pm, M-F.  Also accepts Medicaid/Medicare and self-pay.  ° [facility] ° [address],  Phone: (336) 832-3150, Fax: (336) 832-3151. Hours of Operation:  8:30 am - 5:30 pm, M-F.  Also accepts Medicaid and self-pay.  °[facility] [place] [address], [place] Phone: (336) 878-6027   °  [facility] [address], [place], [place] (336)723-1848, Ext. 123 Mondays & Thursdays: 7-9 AM.  First 15 patients are seen on a first come, first serve basis. °  ° °Medicaid-accepting [place] Providers: ° °Organization         Address  Phone   Notes  °[facility] [address], [place] (336) 641-2100 Also accepts self-pay patients.  °[facility] [address], [place] ° (336) 856-9996   °[facility] [address], [place] (336) 288-8857   °[facility] [address] [place] [address], [place] (336) 299-7000   °Veita Bland [address], [place]  ° (336) 373-1557 Only accepts [place] Access Medicaid patients after they have their name applied to their card.  ° °Self-Pay (no insurance) in [place]: ° °Organization         Address  Phone   Notes  °Sickle Cell Patients, [facility] [address], [place] (336) 832-1970   °[facility] [address], [place] (336) 832-4400   °[facility] ° [address], [place] (336) 992-4800   °[facility]/Dr. Osei-Bonsu ° [address] [place] [address], [place] or [address], [place] (336) 841-8500 Phone number for both [place] and [place] locations is the same.  °[facility] [address], [place] (336) 299-0000   °[facility] [address] [place] [address], [place] or [address] (336) 852-7530 °(336) 878-2260   °[facility] [address], [place] (336) 350-1642, phone; (336) 294-5005, fax Sees patients 1st and 3rd Saturday of every month.  Must not qualify for public or private insurance (i.e. Medicaid, Medicare, [address] Health Choice, Veterans' Benefits) • Household income should be no more than 200% of the poverty level •The clinic cannot treat you if you are pregnant or think you are pregnant • Sexually transmitted diseases are not treated at the clinic.  ° ° °Dental Care: °Organization         Address  Phone  Notes  °[facility] [facility] [address], [place] (336) 641-6152 Accepts children up to age 21 who are enrolled in Medicaid or [place] Health Choice; pregnant women with a Medicaid card; and children who have applied for Medicaid or [place] Health Choice, but were declined, whose parents can pay a reduced fee at time of service.  °[facility] [place]  [address], [place] (336) 641-7733 Accepts children up to age 21 who are enrolled in Medicaid or [place] Health Choice; pregnant women with a Medicaid card; and children who have applied for Medicaid or  Health Choice, but were declined, whose parents can pay a reduced fee at time of service.  °[facility] ° [address],  (336) 641-4533 Patients are seen by appointment only. Walk-ins are not accepted. [facility] will see patients 18 years of age and older. °Monday - Tuesday (8am-5pm) °Most Wednesdays (8:30-5pm) °$30 per visit, cash only  °[facility] ° [address], [place] (336) 641-4533 Patients are seen by appointment only. Walk-ins are not accepted. [facility] will see patients 18 years of age and older. °One  Wednesday Evening (Monthly: Volunteer Based).  $30 per visit,   cash only  °[facility]  (919) 537-3737 for adults; Children under age 4, call Graduate Pediatric Dentistry at (919) [phone]. Children aged 4-14, please call (919) [phone] to request a pediatric application. ° Dental services are provided in all areas of dental care including fillings, crowns and bridges, complete and partial dentures, implants, gum treatment, root canals, and extractions. Preventive care is also provided. Treatment is provided to both adults and children. °Patients are selected via a lottery and there is often a waiting list. °  °[facility] [address], °[place] ° (336) 763-8833 [email or web address] °  °[facility] [address], [place], [place] (336)723-1848, Ext. 123 Second and Fourth Thursday of each month, opens at 6:30 AM; Clinic ends at 9 AM.  Patients are seen on a first-come first-served basis, and a limited number are seen during each clinic.  ° °[facility] ° [address], [place], [place] (336) 723-7904   Eligibility Requirements °You must have lived in [place], [place], or [place] counties for at least the last three months. °  You cannot be eligible for state or federal sponsored healthcare insurance, including Veterans Administration, Medicaid, or Medicare. °  You generally cannot be eligible for healthcare insurance through your employer.  °  How to apply: °Eligibility screenings are held every Tuesday and Wednesday afternoon from 1:00 pm until 4:00 pm. You do not need an appointment for the interview!  °[facility] [address], [place], [place] 336-631-2330   °[facility]  336-342-8273   °[facility]  336-703-3100   °[facility]  336-570-6415   ° °Behavioral Health Resources in the Community: °Intensive Outpatient Programs °Organization          Address  Phone  Notes  °[facility] [address], [place], [place] 336-878-6098   °[facility] [address], [place], [place] 336-832-9800   °[facility] [address], [place], [place] ° 336-882-2125   °[facility] [address],  °[place], [place] 1-800-853-5163 or 336-641-4981   °Substance Abuse Resources °Organization         Address  Phone  Notes  °[facility]  336-882-2125   °[facility]  336-784-9470   °[facility]  336-285-9073   °[facility]  336-845-3988   °Residential & Outpatient Substance Abuse Program  1-800-659-3381   °Psychological Services °Organization         Address  Phone  Notes  °[facility]  336- 832-9600   °[facility]  336- 378-7881   °[facility] [address], [place] 1-800-853-5163 or 336-641-4981   ° °Mobile Crisis Teams °Organization         Address  Phone  Notes  °[facility], Mobile Crisis Care Unit  1-877-626-1772   °Assertive °[facility] ° [address] [place], [place] 336-834-9664   °Sharon DeEsch [address] °[place] 336-554-5454   ° °Self-Help/Support Groups °Organization         Address  Phone             Notes  °[facility] - variety of support groups  336- 373-1402 Call for more information  °Narcotics Anonymous (NA), [facility] [address], °[place]   2 meetings at this location  ° °Residential Treatment Programs °Organization           Address  Phone  Notes  °[facility] [address],    °[place]  1-866-801-8205   °[facility] ° [address], [place], [place] 704-293-8524   °[facility] [address], [place] 336-845-3988 Admissions: 8am-3pm M-F  °[facility] [address]    °[place], [place] 336-841-1104   °[facility] [address], [place], [place] 336-379-7146   °[facility] [address]  °[place], [place] 336-285-9073   °[facility] - Intensive Outpatient [address], [place], [place] 336-852-3033   °[facility] [address]  °[place], [place] 1-877-615-2722 or 336-784-9470   °[facility] [address] [place], [place] 336-227-7417 Accepts Medicaid  °[facility] [address]  °[place] 1-800-659-3381 Substance Abuse/Addiction Treatment  ° °[place] Behavioral Health Resources °Organization         Address  Phone  Notes  °[facility]  (888) 581-9988   °Julie Brannon, PhD [address] [place], [place]   (336) 349-5553 or (336) 951-0000   °[facility]   [address] °[place], [place] (336) 349-4454   °[facility] [address], [place], [place] (336) 342-8316 Insurance/Medicaid/sponsorship through [facility]  °[facility] [address]                                    [place], [place] (336) 342-8316 Therapy/tele-psych/case  °[facility] [address]  ° [place], [place] (336) 349-2233    °Dr. Arfeen  (336) 349-4544   °[facility] [place]  [employer] [place] [facility] 1) [address], [place] °2) [address], [place] °3)  [address], [place] (336) 349-3220 °(336) 342-7768 ° °(336) 342-8140   °[place] [facility] (336) 342-1394 or (336) 342-3537 (After Hours)    ° ° ° ° °

## 2014-01-25 NOTE — Care Management (Signed)
ED CM received call from patient concerning the cost of her discharge prescriptions. Patient was [facility] at [facility] ED today and received 3 prescriptions. Patient states, she was quoted $140 for all 3 prescriptions. Reviewed the Good rx website with drug coupons, patient states, she could afford medication with couponss. Text coupons to patient, confirmed receipt. No further CM needs identified.

## 2014-01-25 NOTE — ED Notes (Signed)
Pt reports left sided dental pain since yesterday. Reports "rubbing Tylenol #3 on area" with no relief. Pt in NAD.

## 2014-01-26 NOTE — ED Provider Notes (Signed)
Medical screening examination/treatment/procedure(s) were performed by non-physician practitioner and as supervising physician I was immediately available for consultation/collaboration.   EKG Interpretation None       Flint MelterElliott L Darcee Dekker, MD 01/26/14 1035

## 2014-01-31 ENCOUNTER — Encounter (HOSPITAL_COMMUNITY): Payer: Self-pay | Admitting: Emergency Medicine

## 2014-02-17 ENCOUNTER — Emergency Department (HOSPITAL_COMMUNITY)
Admission: EM | Admit: 2014-02-17 | Discharge: 2014-02-17 | Disposition: A | Discharge status: Home / Self Care | Payer: Medicaid Other | Attending: Emergency Medicine | Admitting: Emergency Medicine

## 2014-02-17 ENCOUNTER — Encounter (HOSPITAL_COMMUNITY): Payer: Self-pay | Admitting: Emergency Medicine

## 2014-02-17 DIAGNOSIS — Z720 Tobacco use: Secondary | ICD-10-CM | POA: Insufficient documentation

## 2014-02-17 DIAGNOSIS — Z79899 Other long term (current) drug therapy: Secondary | ICD-10-CM | POA: Insufficient documentation

## 2014-02-17 DIAGNOSIS — J45909 Unspecified asthma, uncomplicated: Secondary | ICD-10-CM | POA: Insufficient documentation

## 2014-02-17 DIAGNOSIS — K0889 Other specified disorders of teeth and supporting structures: Secondary | ICD-10-CM

## 2014-02-17 DIAGNOSIS — K088 Other specified disorders of teeth and supporting structures: Secondary | ICD-10-CM | POA: Insufficient documentation

## 2014-02-17 DIAGNOSIS — K0381 Cracked tooth: Secondary | ICD-10-CM | POA: Insufficient documentation

## 2014-02-17 DIAGNOSIS — R112 Nausea with vomiting, unspecified: Secondary | ICD-10-CM | POA: Insufficient documentation

## 2014-02-17 MED ORDER — PENICILLIN V POTASSIUM 250 MG PO TABS
500.0000 mg | ORAL_TABLET | Freq: Four times a day (QID) | ORAL | Status: DC
  Administered 2014-02-17: 500 mg via ORAL
  Filled 2014-02-17: qty 2

## 2014-02-17 MED ORDER — BUPIVACAINE-EPINEPHRINE (PF) 0.5% -1:200000 IJ SOLN
1.8000 mL | Freq: Once | INTRAMUSCULAR | Status: AC
  Administered 2014-02-17: 1.8 mL

## 2014-02-17 MED ORDER — PENICILLIN V POTASSIUM 500 MG PO TABS: 500.0000 mg | ORAL_TABLET | Freq: Four times a day (QID) | ORAL | Status: DC

## 2014-02-17 MED ORDER — NAPROXEN 500 MG PO TABS: 500.0000 mg | ORAL_TABLET | Freq: Two times a day (BID) | ORAL | Status: DC

## 2014-02-17 MED ORDER — HYDROCODONE-ACETAMINOPHEN 5-325 MG PO TABS
1.0000 | ORAL_TABLET | Freq: Once | ORAL | Status: AC
  Administered 2014-02-17: 1 via ORAL
  Filled 2014-02-17: qty 1

## 2014-02-17 MED ORDER — HYDROCODONE-ACETAMINOPHEN 5-325 MG PO TABS: 1.0000 | ORAL_TABLET | ORAL | Status: DC | PRN

## 2014-02-17 NOTE — ED Notes (Signed)
DENTAL BOX AT BEDSIDE

## 2014-02-17 NOTE — ED Provider Notes (Signed)
Jinny Blossomshley Ann Shallenberger is a 23 y.o. female complaining of left lower dental pain. Inferior alveolar block given with good relief.   NERVE BLOCK Date/Time: 02/17/2014 4:50 PM Performed by: Wynetta EmeryPISCIOTTA, Darnella Zeiter Authorized by: Wynetta EmeryPISCIOTTA, Princella Jaskiewicz Consent given by: patient Patient identity confirmed: verbally with patient Indications: pain relief Body area: face/mouth Nerve: inferior alveolar Laterality: left Patient sedated: no Patient position: sitting Needle gauge: 27 G Location technique: anatomical landmarks Local anesthetic: bupivacaine 0.5% with epinephrine Anesthetic total: 1.8 ml Outcome: pain improved Patient tolerance: Patient tolerated the procedure well with no immediate complications  Wynetta Emeryicole Nayomi Tabron, PA-C 02/17/14 1651  Tilden FossaElizabeth Rees, MD 02/17/14 1706

## 2014-02-17 NOTE — ED Notes (Signed)
Patient states she is still having problems with her L upper and lower teeth.  Patient states extensive decay, but "I haven't been able to go to a dentist.  I am on my own and I don't have the money to go".   Patient states has been using oragel.   Patient states she has been taking xanax also.    Patient states hasn't taken anything at home "cause my mama told me that I can't take anything because of my asthma".   Patient denies any asthma problems.

## 2014-02-17 NOTE — Discharge Instructions (Signed)
Dental Pain °A tooth ache may be caused by cavities (tooth decay). Cavities expose the nerve of the tooth to air and hot or cold temperatures. It may come from an infection or abscess (also called a boil or furuncle) around your tooth. It is also often caused by dental caries (tooth decay). This causes the pain you are having. °DIAGNOSIS  °Your caregiver can diagnose this problem by exam. °TREATMENT  °· If caused by an infection, it may be treated with medications which kill germs (antibiotics) and pain medications as prescribed by your caregiver. Take medications as directed. °· Only take over-the-counter or prescription medicines for pain, discomfort, or fever as directed by your caregiver. °· Whether the tooth ache today is caused by infection or dental disease, you should see your dentist as soon as possible for further care. °SEEK MEDICAL CARE IF: °The exam and treatment you received today has been provided on an emergency basis only. This is not a substitute for complete medical or dental care. If your problem worsens or new problems (symptoms) appear, and you are unable to meet with your dentist, call or return to this location. °SEEK IMMEDIATE MEDICAL CARE IF:  °· You have a fever. °· You develop redness and swelling of your face, jaw, or neck. °· You are unable to open your mouth. °· You have severe pain uncontrolled by pain medicine. °MAKE SURE YOU:  °· Understand these instructions. °· Will watch your condition. °· Will get help right away if you are not doing well or get worse. °Document Released: 03/18/2005 Document Revised: 06/10/2011 Document Reviewed: 11/04/2007 °ExitCare® Patient Information ©2015 [employer]. This information is not intended to replace advice given to you by your health care provider. Make sure you discuss any questions you have with your health care provider. ° °Emergency Department Resource Guide °1) Find a Doctor and Pay Out of Pocket °Although you won't have to find out who  is covered by your insurance plan, it is a good idea to ask around and get recommendations. You will then need to call the office and see if the doctor you have chosen will accept you as a new patient and what types of options they offer for patients who are self-pay. Some doctors offer discounts or will set up payment plans for their patients who do not have insurance, but you will need to ask so you aren't surprised when you get to your appointment. ° °2) Contact Your Local Health Department °Not all health departments have doctors that can see patients for sick visits, but many do, so it is worth a call to see if yours does. If you don't know where your local health department is, you can check in your phone book. The CDC also has a tool to help you locate your state's health department, and many state websites also have listings of all of their local health departments. ° °3) Find a Walk-in Clinic °If your illness is not likely to be very severe or complicated, you may want to try a walk in clinic. These are popping up all over the country in pharmacies, drugstores, and shopping centers. They're usually staffed by nurse practitioners or physician assistants that have been trained to treat common illnesses and complaints. They're usually fairly quick and inexpensive. However, if you have serious medical issues or chronic medical problems, these are probably not your best option. ° °No Primary Care Doctor: °- Call [facility] Connect at  832-8000 - they can help you locate a primary   care doctor that  accepts your insurance, provides certain services, etc. °- Physician Referral Service- 1-800-533-3463 ° °Chronic Pain Problems: °Organization         Address  Phone   Notes  °[facility]  (336) 297-2271 Patients need to be referred by their primary care doctor.  ° °Medication Assistance: °Organization         Address  Phone   Notes  °[place] [facility] [address]  [address] °[place], [place] [address] (336) 641-8030 --Must be a resident of [place] °-- Must have NO insurance coverage whatsoever (no Medicaid/ Medicare, etc.) °-- The pt. MUST have a primary care doctor that directs their care regularly and follows them in the community °  °[facility]  (866) 331-1348   °[employer]  (888) 892-1162   ° °Agencies that provide inexpensive medical care: °Organization         Address  Phone   Notes  °[facility]  (336) 832-8035   °[facility]    (336) 832-7272   °[facility] [address] °[place], [place] [address] (336) 832-4777   °[facility] [address], °[place] (336) 271-4999   °[facility]    (336) 373-0678   °[facility]    (336) 272-1050   °[facility] ° [address], [place] Phone:  (336) 832-4444, Fax:  (336) 832-4440 Hours of Operation:  9 am - 6 pm, M-F.  Also accepts Medicaid/Medicare and self-pay.  °[facility] ° [address], [place] Phone: (336) 832-3150, Fax: (336) 832-3151. Hours of Operation:  8:30 am - 5:30 pm, M-F.  Also accepts Medicaid and self-pay.  °[facility] [place] [address], [place] Phone: (336) 878-6027   °[facility] [address], [place], [place] (336)723-1848, Ext. 123 Mondays & Thursdays: 7-9 AM.  First 15 patients are seen on a first come, first serve basis. °  ° °Medicaid-accepting [place] Providers: ° °Organization         Address  Phone   Notes  °[facility] [address], [place] (336) 641-2100 Also accepts self-pay patients.  °[facility] [address], [place] ° (336) 856-9996   °[facility] [address], [place] (336) 288-8857   °[facility] [address] [place] [address], [place] (336) 299-7000   °Veita Bland [address], [place]  ° (336) 373-1557 Only accepts [place] Access Medicaid patients after they have their name applied to their card.  ° °Self-Pay (no insurance) in [place]: ° °Organization         Address  Phone   Notes  °Sickle Cell Patients, [facility] [address], [place] (336) 832-1970   °[facility] [address], [place] (336) 832-4400   °[facility] ° [address], [place] (336) 992-4800   °[facility]/Dr. Osei-Bonsu ° [address] [place] [address], [place] or [address], [place] (336) 841-8500 Phone number for both [place] and [place] locations is the same.  °[facility] [address], [place] (336) 299-0000   °[facility] [address] [place] [address],  or [address] (336) 852-7530 °(336) 878-2260   °  [facility] [address], [place] (336) 350-1642, phone; (336) 294-5005, fax Sees patients 1st and 3rd Saturday of every month.  Must not qualify for public or private insurance (i.e. Medicaid, Medicare, [place] Health Choice, Veterans' Benefits) • Household income should be no more than 200% of the poverty level •The clinic cannot treat you if you are pregnant or think you are pregnant • Sexually transmitted diseases are not treated at the clinic.  ° ° °Dental Care: °Organization         Address  Phone  Notes  °[facility] [address], [place] (336) 641-6152 Accepts children up to age 21 who are enrolled in Medicaid or [place] Health Choice; pregnant women with a Medicaid card; and children who have applied for Medicaid or [place] Health Choice, but were declined, whose parents can pay a reduced fee at time of service.  °[facility] [place]  [address], [place] (336) 641-7733 Accepts children up to age 21 who are  enrolled in Medicaid or [place] Health Choice; pregnant women with a Medicaid card; and children who have applied for Medicaid or [place] Health Choice, but were declined, whose parents can pay a reduced fee at time of service.  °[facility] ° [address], [place] (336) 641-4533 Patients are seen by appointment only. Walk-ins are not accepted. [facility] will see patients 18 years of age and older. °Monday - Tuesday (8am-5pm) °Most Wednesdays (8:30-5pm) °$30 per visit, cash only  °[facility] ° [address], [place] (336) 641-4533 Patients are seen by appointment only. Walk-ins are not accepted. [facility] will see patients 18 years of age and older. °One Wednesday Evening (Monthly: Volunteer Based).  $30 per visit, cash only  °[facility]  (919) 537-3737 for adults; Children under age 4, call Graduate Pediatric Dentistry at (919) [phone]. Children aged 4-14, please call (919) [phone] to request a pediatric application. ° Dental services are provided in all areas of dental care including fillings, crowns and bridges, complete and partial dentures, implants, gum treatment, root canals, and extractions. Preventive care is also provided. Treatment is provided to both adults and children. °Patients are selected via a lottery and there is often a waiting list. °  °[facility] [address], °[place] ° (336) 763-8833 [email or web address] °  °[facility] [address], [place], [place] (336)723-1848, Ext. 123 Second and Fourth Thursday of each month, opens at 6:30 AM; Clinic ends at 9 AM.  Patients are seen on a first-come first-served basis, and a limited number are seen during each clinic.  ° °[facility] ° [address], [place], [place] (336) 723-7904   Eligibility Requirements °You must have lived in [place], [place], or [place] counties for at least the last three months. °  You  cannot be eligible for state or federal sponsored healthcare insurance, including Veterans Administration, Medicaid, or Medicare. °  You generally cannot be eligible for healthcare insurance through your employer.  °  How to apply: °Eligibility screenings are held every Tuesday and Wednesday afternoon from 1:00 pm until 4:00 pm. You do not need an appointment for the interview!  °[facility] [address], [place], [place] 336-631-2330   °[facility]  336-342-8273   °[facility]  336-703-3100   °[facility]  336-570-6415   ° °Behavioral Health Resources in the Community: °Intensive Outpatient Programs °Organization         Address  Phone  Notes  °[facility] [address], [place], [place] 336-878-6098   °[facility] [address], [place], [place] 336-832-9800   °[facility] [address], [place], [place] ° 336-882-2125   °[facility] [address],  °[place], [place] 1-800-853-5163 or 336-641-4981   °Substance Abuse Resources °Organization         Address  Phone  Notes  °[facility]  336-882-2125   °[facility]  336-784-9470   °[facility]  336-285-9073   °[facility]  336-845-3988   °Residential & Outpatient Substance Abuse Program  1-800-659-3381   °Psychological Services °Organization         Address  Phone  Notes  °[facility]  336- 832-9600   °[facility]  336- 378-7881   °[facility] [address], [place] 1-800-853-5163 or 336-641-4981   ° °Mobile Crisis Teams °Organization         Address  Phone  Notes  °[facility], Mobile Crisis Care Unit  1-877-626-1772   °Assertive °[facility] ° [address] [place], [place] 336-834-9664   °Sharon DeEsch [address] °[place] 336-554-5454   ° °Self-Help/Support  Groups °Organization         Address  Phone             Notes  °[facility] - variety of support groups  336- 373-1402 Call for more information  °Narcotics Anonymous (NA), [facility] [address], °[place]  2 meetings at this location  ° °Residential Treatment Programs °Organization         Address  Phone  Notes  °[facility] [address],    °[place]  1-866-801-8205   °[facility] ° [address], [place], [place] 704-293-8524   °[facility] [address], [place] 336-845-3988 Admissions: 8am-3pm M-F  °[facility] [address]    °[place], [place] 336-841-1104   °[facility] [address], [place], [place] 336-379-7146   °[facility] [address]  °[place], [place] 336-285-9073   °[facility] Intensive Outpatient [address], [place], [place] 336-852-3033   °[facility] [address]  °[place], [place] 1-877-615-2722 or 336-784-9470   °[facility] [address] [place], [place] 336-227-7417 Accepts Medicaid  °[facility] [address]  °[place] 1-800-659-3381 Substance Abuse/Addiction Treatment  ° °[place] Behavioral Health Resources °Organization         Address  Phone  Notes  °[facility]  (888) 581-9988   °Julie Brannon, PhD [address] [place], [place]   (336) 349-5553 or (336) 951-0000   °[facility]   [address] °[place], [place] (336) 349-4454   °[facility] [address], [place],  (336) 342-8316 Insurance/Medicaid/sponsorship through [facility]  °[facility] [address]                                    [place],  (336) 342-8316 Therapy/tele-psych/case  °[facility]   [address]  ° [place], [place] (336) 349-2233    °Dr. Arfeen  (336) 349-4544   °[facility] [place]  [facility]  [employer] [place] [facility] 1) [address], [place] °2) [address], [place] °3)  [address], [place] (336) 349-3220 °(336) 342-7768 ° °(336) 342-8140   °[place] [facility] (336) 342-1394 or (336) 342-3537 (After Hours)    ° ° ° °

## 2014-02-17 NOTE — ED Notes (Signed)
Father to drive home.

## 2014-02-17 NOTE — ED Provider Notes (Signed)
CSN: [id number]     Arrival date & time 02/17/14  [id number] History   First MD Initiated Contact with Patient 02/17/14 1105     Chief Complaint  Patient presents with  . Dental Pain   Kathryn Medina is a 23 y.o. female who presents to the emergency department complaining of left lower dental pain for about 1 month. She describes the pain as sharp and rates it a 10 out of 10. Her pain is worse with cool liquids or air. The patient was seen at the emergency department on 01/25/2014 and was instructed to see a dentist the patient reports she did not go to the dentist. She reports having continued pain. She reports her pain is worse today. She reports that she has vomited twice today due to pain. She denies fevers, chills, drainage from her mouth, abdominal pain, diarrhea, rashes, cough, sore throat, or trouble swallowing. She denies history of previous dental infection that required an admission. She has not attempted any treatments for her pain today.  (Consider location/radiation/quality/duration/timing/severity/associated sxs/prior Treatment) Patient is a 23 y.o. female presenting with tooth pain. The history is provided by the patient.  Dental Pain Associated symptoms: no congestion, no drooling, no facial swelling, no fever, no headaches, no neck pain and no oral lesions     Past Medical History  Diagnosis Date  . Asthma    Past Surgical History  Procedure Laterality Date  . Dilation and evacuation  03/22/2012    Procedure: DILATATION AND EVACUATION;  Surgeon: Brock Badharles A Harper, MD;  Location: [facility];  Service: Gynecology;  Laterality: N/A;   Family History  Problem Relation Age of Onset  . Cancer Paternal Grandfather   . Cancer Paternal Grandmother   . Emphysema Maternal Grandmother   . Hypertension Father    History  Substance Use Topics  . Smoking status: Current Every Day Smoker -- 0.50 packs/day for 5 years    Types: Cigarettes  . Smokeless tobacco: Never Used     Comment: pt  is trying to use the electronic cigarette  . Alcohol Use: No     Comment: social   OB History    Gravida Para Term Preterm AB TAB SAB Ectopic Multiple Living   1 0 0 0 1 0 1 0 0 0      Review of Systems  Constitutional: Negative for fever and chills.  HENT: Positive for dental problem. Negative for congestion, drooling, ear pain, facial swelling, mouth sores, rhinorrhea, sneezing, sore throat and trouble swallowing.   Eyes: Negative for pain and visual disturbance.  Respiratory: Negative for cough, shortness of breath and wheezing.   Cardiovascular: Negative for chest pain and palpitations.  Gastrointestinal: Positive for nausea and vomiting. Negative for abdominal pain and diarrhea.  Genitourinary: Negative for dysuria and decreased urine volume.  Musculoskeletal: Negative for back pain and neck pain.  Skin: Negative for rash.  Neurological: Negative for weakness, light-headedness, numbness and headaches.  All other systems reviewed and are negative.     Allergies  Review of patient's allergies indicates no known allergies.  Home Medications   Prior to Admission medications   Medication Sig Start Date End Date Taking? Authorizing Provider  HYDROcodone-acetaminophen (NORCO/VICODIN) 5-325 MG per tablet Take 1 tablet by mouth every 4 (four) hours as needed for moderate pain or severe pain. 02/17/14   Einar GipWilliam Duncan Nilza Eaker, PA-C  ibuprofen (ADVIL,MOTRIN) 800 MG tablet Take 1 tablet (800 mg total) by mouth every 8 (eight) hours as needed for mild pain or  moderate pain. 01/25/14   Trixie DredgeEmily West, PA-C  naproxen (NAPROSYN) 500 MG tablet Take 1 tablet (500 mg total) by mouth 2 (two) times daily with a meal. 02/17/14   Einar GipWilliam Duncan Keilana Morlock, PA-C  penicillin v potassium (VEETID) 500 MG tablet Take 1 tablet (500 mg total) by mouth 4 (four) times daily. 02/17/14   Einar GipWilliam Duncan Janele Lague, PA-C   BP 122/74 mmHg  Pulse 71  Temp(Src) 98.3 F (36.8 C) (Oral)  Resp 16  SpO2 100%  LMP  02/10/2014 Physical Exam  Constitutional: She appears well-developed and well-nourished. No distress.  HENT:  Head: Normocephalic and atraumatic.  Right Ear: External ear normal.  Left Ear: External ear normal.  Nose: Nose normal.  Mouth/Throat: Oropharynx is clear and moist. No oropharyngeal exudate.  Patient has a cracked left lower molar. No evidence of erythema, induration, abscess, fluctuance or discharge. No tonsillar hypertrophy or exudate. Uvula is midline and without erythema. No facial swelling noted.   Eyes: Conjunctivae are normal. Pupils are equal, round, and reactive to light. Right eye exhibits no discharge. Left eye exhibits no discharge. No scleral icterus.  Neck: Normal range of motion. Neck supple.  Patient with mild submandibular reactive lymphadenopathy. No cervical lymphadenopathy noted. No Ludwig angina.   Cardiovascular: Normal rate, regular rhythm, normal heart sounds and intact distal pulses.  Exam reveals no gallop and no friction rub.   No murmur heard. Pulmonary/Chest: Effort normal and breath sounds normal. No respiratory distress. She has no wheezes. She has no rales.  Abdominal: Soft. Bowel sounds are normal. She exhibits no distension and no mass. There is no tenderness. There is no rebound and no guarding.  Musculoskeletal: She exhibits no edema.  Lymphadenopathy:    She has no cervical adenopathy.  Neurological: She is alert. Coordination normal.  Skin: Skin is warm and dry. No rash noted. She is not diaphoretic. No erythema. No pallor.  Psychiatric: She has a normal mood and affect. Her behavior is normal.  Nursing note and vitals reviewed.   ED Course  Procedures (including critical care time) Labs Review Labs Reviewed - No data to display  Imaging Review No results found.   EKG Interpretation None      Filed Vitals:   02/17/14 1016 02/17/14 1204  BP: 125/77 122/74  Pulse: 79 71  Temp: 98 F (36.7 C) 98.3 F (36.8 C)  TempSrc: Oral  Oral  Resp: 20 16  SpO2: 100% 100%     MDM   Meds given in ED:  Medications  HYDROcodone-acetaminophen (NORCO/VICODIN) 5-325 MG per tablet 1 tablet (1 tablet Oral Given 02/17/14 1128)  bupivacaine-epinephrine (MARCAINE W/ EPI) 0.5% -1:200000 injection 1.8 mL (1.8 mLs Infiltration Given by Other 02/17/14 1130)    Discharge Medication List as of 02/17/2014 11:51 AM    START taking these medications   Details  HYDROcodone-acetaminophen (NORCO/VICODIN) 5-325 MG per tablet Take 1 tablet by mouth every 4 (four) hours as needed for moderate pain or severe pain., Starting 02/17/2014, Until Discontinued, Print    naproxen (NAPROSYN) 500 MG tablet Take 1 tablet (500 mg total) by mouth 2 (two) times daily with a meal., Starting 02/17/2014, Until Discontinued, Print    penicillin v potassium (VEETID) 500 MG tablet Take 1 tablet (500 mg total) by mouth 4 (four) times daily., Starting 02/17/2014, Until Discontinued, Print        Final diagnoses:  Pain, dental   Kathryn Medina is a 23 y.o. female who presents to the emergency department complaining of left  lower dental pain for about 1 month. The patient has not gone to the dentist since last visit. The patient has a cracked left lower molar. There is no evidence of abscess or Ludwig angina. A dental block was performed by Wynetta Emery PA. The patient was discharged with penicillin VK, and Naprosyn and Norco. Advised patient use caution when taking Norco as it can make her drowsy. I advised patient that she needs to make an appointment with the on-call dentist within 48 hours. I advised that she needs to return to the ED with new or worsening symptoms or new concerns. Patient verbalized understanding and agreement with plan.  The patient was discussed with and evaluated by Wynetta Emery PA who agrees with assessment and plan.    Lawana Chambers, PA-C 02/17/14 1722  Tilden Fossa, MD 02/18/14 [phone]

## 2016-01-23 ENCOUNTER — Ambulatory Visit (INDEPENDENT_AMBULATORY_CARE_PROVIDER_SITE_OTHER): Payer: Medicaid Other | Admitting: Obstetrics

## 2016-01-23 ENCOUNTER — Other Ambulatory Visit (HOSPITAL_COMMUNITY)
Admission: RE | Admit: 2016-01-23 | Discharge: 2016-01-23 | Disposition: A | Discharge status: Home / Self Care | Payer: Medicaid Other | Source: Ambulatory Visit | Attending: Obstetrics | Admitting: Obstetrics

## 2016-01-23 ENCOUNTER — Encounter: Payer: Self-pay | Admitting: Obstetrics

## 2016-01-23 VITALS — BP 113/75 | HR 94 | Temp 97.5°F | Wt 151.0 lb

## 2016-01-23 DIAGNOSIS — F1910 Other psychoactive substance abuse, uncomplicated: Secondary | ICD-10-CM | POA: Diagnosis not present

## 2016-01-23 DIAGNOSIS — Z113 Encounter for screening for infections with a predominantly sexual mode of transmission: Secondary | ICD-10-CM | POA: Diagnosis present

## 2016-01-23 DIAGNOSIS — Z3400 Encounter for supervision of normal first pregnancy, unspecified trimester: Secondary | ICD-10-CM | POA: Insufficient documentation

## 2016-01-23 DIAGNOSIS — Z01419 Encounter for gynecological examination (general) (routine) without abnormal findings: Secondary | ICD-10-CM | POA: Insufficient documentation

## 2016-01-23 DIAGNOSIS — N760 Acute vaginitis: Secondary | ICD-10-CM | POA: Insufficient documentation

## 2016-01-23 DIAGNOSIS — Z3491 Encounter for supervision of normal pregnancy, unspecified, first trimester: Secondary | ICD-10-CM

## 2016-01-23 DIAGNOSIS — O99322 Drug use complicating pregnancy, second trimester: Secondary | ICD-10-CM | POA: Diagnosis not present

## 2016-01-23 DIAGNOSIS — Z3687 Encounter for antenatal screening for uncertain dates: Secondary | ICD-10-CM

## 2016-01-23 DIAGNOSIS — Z3481 Encounter for supervision of other normal pregnancy, first trimester: Secondary | ICD-10-CM | POA: Diagnosis not present

## 2016-01-23 LAB — OB RESULTS CONSOLE GC/CHLAMYDIA: Gonorrhea: NEGATIVE

## 2016-01-23 NOTE — Progress Notes (Signed)
Subjective:    Kathryn Blossomshley Ann Kawabata is being seen today for her first obstetrical visit.  This is not a planned pregnancy. She is at 2952w6d gestation. Her obstetrical history is significant for smoker. Relationship with FOB: significant other, not living together. Patient does intend to breast feed. Pregnancy history fully reviewed.  The information documented in the HPI was reviewed and verified.  Menstrual History: OB History    Gravida Para Term Preterm AB Living   2 0 0 0 1 0   SAB TAB Ectopic Multiple Live Births   1 0 0 0         Patient's last menstrual period was 11/15/2015 (approximate).    Past Medical History:  Diagnosis Date  . Asthma     Past Surgical History:  Procedure Laterality Date  . DILATION AND EVACUATION  03/22/2012   Procedure: DILATATION AND EVACUATION;  Surgeon: Brock Badharles A Harper, MD;  Location: [facility];  Service: Gynecology;  Laterality: N/A;     (Not in a hospital admission) No Known Allergies  Social History  Substance Use Topics  . Smoking status: Current Every Day Smoker    Packs/day: 0.25    Years: 5.00    Types: Cigarettes  . Smokeless tobacco: Never Used     Comment: pt is trying to use the electronic cigarette  . Alcohol use No     Comment: social    Family History  Problem Relation Age of Onset  . Cancer Paternal Grandfather   . Cancer Paternal Grandmother   . Emphysema Maternal Grandmother   . Hypertension Father   . Cancer Father      Review of Systems Constitutional: negative for weight loss Gastrointestinal: negative for vomiting Genitourinary:negative for genital lesions and vaginal discharge and dysuria Musculoskeletal:negative for back pain Behavioral/Psych: negative for abusive relationship, depression, illegal drug usage and tobacco use    Objective:    LMP 11/15/2015 (Approximate)  General Appearance:    Alert, cooperative, no distress, appears stated age  Head:    Normocephalic, without obvious abnormality, atraumatic   Eyes:    PERRL, conjunctiva/corneas clear, EOM's intact, fundi    benign, both eyes  Ears:    Normal TM's and external ear canals, both ears  Nose:   Nares normal, septum midline, mucosa normal, no drainage    or sinus tenderness  Throat:   Lips, mucosa, and tongue normal; teeth and gums normal  Neck:   Supple, symmetrical, trachea midline, no adenopathy;    thyroid:  no enlargement/tenderness/nodules; no carotid   bruit or JVD  Back:     Symmetric, no curvature, ROM normal, no CVA tenderness  Lungs:     Clear to auscultation bilaterally, respirations unlabored  Chest Wall:    No tenderness or deformity   Heart:    Regular rate and rhythm, S1 and S2 normal, no murmur, rub   or gallop  Breast Exam:    No tenderness, masses, or nipple abnormality  Abdomen:     Soft, non-tender, bowel sounds active all four quadrants,    no masses, no organomegaly  Genitalia:    Normal female without lesion, discharge or tenderness  Extremities:   Extremities normal, atraumatic, no cyanosis or edema  Pulses:   2+ and symmetric all extremities  Skin:   Skin color, texture, turgor normal, no rashes or lesions  Lymph nodes:   Cervical, supraclavicular, and axillary nodes normal  Neurologic:   CNII-XII intact, normal strength, sensation and reflexes    throughout  Lab Review Urine pregnancy test Labs reviewed yes Radiologic studies reviewed no Assessment:    Pregnancy at [redacted]w[redacted]d weeks    Plan:      Prenatal vitamins.  Counseling provided regarding continued use of seat belts, cessation of alcohol consumption, smoking or use of illicit drugs; infection precautions i.e., influenza/TDAP immunizations, toxoplasmosis,CMV, parvovirus, listeria and varicella; workplace safety, exercise during pregnancy; routine dental care, safe medications, sexual activity, hot tubs, saunas, pools, travel, caffeine use, fish and methlymercury, potential toxins, hair treatments, varicose veins Weight gain recommendations  per IOM guidelines reviewed: underweight/BMI< 18.5--> gain 28 - 40 lbs; normal weight/BMI 18.5 - 24.9--> gain 25 - 35 lbs; overweight/BMI 25 - 29.9--> gain 15 - 25 lbs; obese/BMI >30->gain  11 - 20 lbs Problem list reviewed and updated. FIRST/CF mutation testing/NIPT/QUAD SCREEN/fragile X/Ashkenazi Jewish population testing/Spinal muscular atrophy discussed: requested. Role of ultrasound in pregnancy discussed; fetal survey: requested. Amniocentesis discussed: not indicated. VBAC calculator score: VBAC consent form provided Meds ordered this encounter  Medications  . prenatal vitamin w/FE, FA (PRENATAL 1 + 1) 27-1 MG TABS tablet    Sig: Take 1 tablet by mouth daily at 12 noon.   No orders of the defined types were placed in this encounter.   Follow up in 4 weeks. 50% of 20 min visit spent on counseling and coordination of care. Patient ID: Kathryn Medina, female   DOB: 02/11/1991, 25 y.o.   MRN: [id number]

## 2016-01-23 NOTE — Progress Notes (Signed)
Patient is concerned about her dating. 

## 2016-01-24 LAB — CERVICOVAGINAL ANCILLARY ONLY
CHLAMYDIA, DNA PROBE: NEGATIVE
NEISSERIA GONORRHEA: NEGATIVE
Trichomonas: NEGATIVE

## 2016-01-25 ENCOUNTER — Other Ambulatory Visit: Payer: Self-pay

## 2016-01-25 ENCOUNTER — Other Ambulatory Visit: Payer: Medicaid Other

## 2016-01-25 ENCOUNTER — Other Ambulatory Visit: Payer: Self-pay | Admitting: Obstetrics

## 2016-01-25 ENCOUNTER — Ambulatory Visit (INDEPENDENT_AMBULATORY_CARE_PROVIDER_SITE_OTHER): Payer: Medicaid Other

## 2016-01-25 DIAGNOSIS — Z3491 Encounter for supervision of normal pregnancy, unspecified, first trimester: Secondary | ICD-10-CM

## 2016-01-25 DIAGNOSIS — Z3687 Encounter for antenatal screening for uncertain dates: Secondary | ICD-10-CM

## 2016-01-25 LAB — CYTOLOGY - PAP: Diagnosis: NEGATIVE

## 2016-01-27 ENCOUNTER — Other Ambulatory Visit: Payer: Self-pay | Admitting: Obstetrics

## 2016-01-27 DIAGNOSIS — O2341 Unspecified infection of urinary tract in pregnancy, first trimester: Secondary | ICD-10-CM

## 2016-01-27 LAB — URINE CULTURE, OB REFLEX

## 2016-01-27 LAB — CULTURE, OB URINE

## 2016-01-27 MED ORDER — AMOXICILLIN-POT CLAVULANATE 875-125 MG PO TABS: 1.0000 | ORAL_TABLET | Freq: Two times a day (BID) | ORAL | 0 refills | Status: DC

## 2016-01-29 LAB — PRENATAL PROFILE I(LABCORP)
Antibody Screen: NEGATIVE
Basophils Absolute: 0 10*3/uL (ref 0.0–0.2)
Basos: 0 %
EOS (ABSOLUTE): 0.2 10*3/uL (ref 0.0–0.4)
Eos: 2 %
Hematocrit: 40.4 % (ref 34.0–46.6)
Hemoglobin: 13.2 g/dL (ref 11.1–15.9)
Hepatitis B Surface Ag: NEGATIVE
Immature Grans (Abs): 0 10*3/uL (ref 0.0–0.1)
Immature Granulocytes: 0 %
LYMPHS ABS: 2.1 10*3/uL (ref 0.7–3.1)
Lymphs: 23 %
MCH: 32 pg (ref 26.6–33.0)
MCHC: 32.7 g/dL (ref 31.5–35.7)
MCV: 98 fL — AB (ref 79–97)
MONOCYTES: 7 %
Monocytes Absolute: 0.6 10*3/uL (ref 0.1–0.9)
NEUTROS ABS: 6.1 10*3/uL (ref 1.4–7.0)
Neutrophils: 68 %
PLATELETS: 210 10*3/uL (ref 150–379)
RBC: 4.13 x10E6/uL (ref 3.77–5.28)
RDW: 12.3 % (ref 12.3–15.4)
RPR Ser Ql: NONREACTIVE
Rh Factor: POSITIVE
Rubella Antibodies, IGG: 4.62 index (ref >0.99)
WBC: 9 10*3/uL (ref 3.4–10.8)

## 2016-01-29 LAB — CERVICOVAGINAL ANCILLARY ONLY: Candida vaginitis: NEGATIVE

## 2016-01-29 LAB — HIV ANTIBODY (ROUTINE TESTING W REFLEX): HIV Screen 4th Generation wRfx: NONREACTIVE

## 2016-01-29 LAB — TOXASSURE SELECT 13 (MW), URINE

## 2016-01-30 ENCOUNTER — Other Ambulatory Visit: Payer: Self-pay | Admitting: Obstetrics

## 2016-01-30 DIAGNOSIS — N760 Acute vaginitis: Principal | ICD-10-CM

## 2016-01-30 DIAGNOSIS — B9689 Other specified bacterial agents as the cause of diseases classified elsewhere: Secondary | ICD-10-CM

## 2016-01-30 MED ORDER — CLINDAMYCIN HCL 300 MG PO CAPS: 300.0000 mg | ORAL_CAPSULE | Freq: Three times a day (TID) | ORAL | 0 refills | Status: DC

## 2016-02-01 ENCOUNTER — Other Ambulatory Visit: Payer: Self-pay | Admitting: Obstetrics

## 2016-02-02 ENCOUNTER — Telehealth: Payer: Self-pay | Admitting: *Deleted

## 2016-02-02 NOTE — Telephone Encounter (Signed)
Pt called to office with questions about Rx at pharmacy.  Attempt to contact pt. No answer, no VM.

## 2016-02-05 NOTE — Telephone Encounter (Signed)
Attempted to call patient- did not get answer- LM on Vm- re: UTI/BV

## 2016-02-20 ENCOUNTER — Ambulatory Visit (INDEPENDENT_AMBULATORY_CARE_PROVIDER_SITE_OTHER): Payer: Medicaid Other | Admitting: Certified Nurse Midwife

## 2016-02-20 VITALS — BP 122/75 | HR 81 | Wt 148.0 lb

## 2016-02-20 DIAGNOSIS — F1910 Other psychoactive substance abuse, uncomplicated: Secondary | ICD-10-CM | POA: Diagnosis not present

## 2016-02-20 DIAGNOSIS — O219 Vomiting of pregnancy, unspecified: Secondary | ICD-10-CM

## 2016-02-20 DIAGNOSIS — Z3402 Encounter for supervision of normal first pregnancy, second trimester: Secondary | ICD-10-CM

## 2016-02-20 DIAGNOSIS — O99322 Drug use complicating pregnancy, second trimester: Secondary | ICD-10-CM | POA: Diagnosis not present

## 2016-02-20 DIAGNOSIS — Z3400 Encounter for supervision of normal first pregnancy, unspecified trimester: Secondary | ICD-10-CM

## 2016-02-20 MED ORDER — DOXYLAMINE-PYRIDOXINE 10-10 MG PO TBEC: DELAYED_RELEASE_TABLET | ORAL | 4 refills | Status: DC

## 2016-02-20 NOTE — Progress Notes (Signed)
  Subjective:    Kathryn Medina is a 25 y.o. female being seen today for her obstetrical visit. She is at 7458w1d gestation. Patient reports: nausea, no bleeding, no cramping and vaginal irritation.    Problem List Items Addressed This Visit      Other   Supervision of normal first pregnancy   Relevant Orders   MaterniT21 PLUS Core+SCA   TSH   Varicella zoster antibody, IgG   ToxASSURE Select 13 (MW), Urine   Hemoglobin A1c   Cystic Fibrosis Mutation 97    Other Visit Diagnoses    Nausea and vomiting during pregnancy prior to [redacted] weeks gestation    -  Primary   Relevant Medications   Doxylamine-Pyridoxine (DICLEGIS) 10-10 MG TBEC     Patient Active Problem List   Diagnosis Date Noted  . Supervision of normal first pregnancy 01/23/2016    Objective:     BP 122/75   Pulse 81   Wt 148 lb (67.1 kg)   LMP 11/15/2015 (Approximate)   BMI 23.18 kg/m  Uterine Size: Below umbilicus    FHR; 162   By doppler  Assessment:    Pregnancy @ 5558w1d  weeks N&V in early pregnancy    Plan:    Problem list reviewed and updated. Labs reviewed.  Follow up in 4 weeks. FIRST/CF mutation testing/NIPT/QUAD SCREEN/fragile X/Ashkenazi Jewish population testing/Spinal muscular atrophy discussed: ordered. Role of ultrasound in pregnancy discussed; fetal survey: requested. Amniocentesis discussed: not indicated. 50% of 15 minute visit spent on counseling and coordination of care.

## 2016-02-20 NOTE — Progress Notes (Signed)
Pt had u/s on 01/25/16, needs review with pt.

## 2016-02-27 LAB — MATERNIT21 PLUS CORE+SCA
Chromosome 13: NEGATIVE
Chromosome 18: NEGATIVE
Chromosome 21: NEGATIVE
Y CHROMOSOME: NOT DETECTED

## 2016-02-27 LAB — HEMOGLOBIN A1C
Est. average glucose Bld gHb Est-mCnc: 94 mg/dL
Hgb A1c MFr Bld: 4.9 % (ref 4.8–5.6)

## 2016-02-27 LAB — CYSTIC FIBROSIS MUTATION 97: GENE DIS ANAL CARRIER INTERP BLD/T-IMP: NOT DETECTED

## 2016-02-27 LAB — TSH: TSH: 1.03 u[IU]/mL (ref 0.450–4.500)

## 2016-02-27 LAB — VARICELLA ZOSTER ANTIBODY, IGG: Varicella zoster IgG: 1577 index (ref >165)

## 2016-02-28 LAB — TOXASSURE SELECT 13 (MW), URINE

## 2016-02-29 ENCOUNTER — Other Ambulatory Visit: Payer: Self-pay | Admitting: Certified Nurse Midwife

## 2016-02-29 DIAGNOSIS — Z3402 Encounter for supervision of normal first pregnancy, second trimester: Secondary | ICD-10-CM

## 2016-03-19 ENCOUNTER — Ambulatory Visit (INDEPENDENT_AMBULATORY_CARE_PROVIDER_SITE_OTHER): Payer: Medicaid Other | Admitting: Obstetrics and Gynecology

## 2016-03-19 VITALS — BP 114/70 | HR 101 | Wt 148.8 lb

## 2016-03-19 DIAGNOSIS — O99322 Drug use complicating pregnancy, second trimester: Secondary | ICD-10-CM | POA: Diagnosis not present

## 2016-03-19 DIAGNOSIS — O99320 Drug use complicating pregnancy, unspecified trimester: Secondary | ICD-10-CM | POA: Insufficient documentation

## 2016-03-19 DIAGNOSIS — F1910 Other psychoactive substance abuse, uncomplicated: Secondary | ICD-10-CM | POA: Diagnosis not present

## 2016-03-19 DIAGNOSIS — Z3402 Encounter for supervision of normal first pregnancy, second trimester: Secondary | ICD-10-CM

## 2016-03-19 NOTE — Progress Notes (Signed)
Patient is concerned about her drug test- she did use some medications she had received from the ED. She is not using any drugs at this time.

## 2016-03-19 NOTE — Patient Instructions (Signed)
Second Trimester of Pregnancy The second trimester is from week 13 through week 28 (months 4 through 6). The second trimester is often a time when you feel your best. Your body has also adjusted to being pregnant, and you begin to feel better physically. Usually, morning sickness has lessened or quit completely, you may have more energy, and you may have an increase in appetite. The second trimester is also a time when the fetus is growing rapidly. At the end of the sixth month, the fetus is about 9 inches long and weighs about 1 pounds. You will likely begin to feel the baby move (quickening) between 18 and 20 weeks of the pregnancy. Body changes during your second trimester Your body continues to go through many changes during your second trimester. The changes vary from woman to woman.  Your weight will continue to increase. You will notice your lower abdomen bulging out.  You may begin to get stretch marks on your hips, abdomen, and breasts.  You may develop headaches that can be relieved by medicines. The medicines should be approved by your health care provider.  You may urinate more often because the fetus is pressing on your bladder.  You may develop or continue to have heartburn as a result of your pregnancy.  You may develop constipation because certain hormones are causing the muscles that push waste through your intestines to slow down.  You may develop hemorrhoids or swollen, bulging veins (varicose veins).  You may have back pain. This is caused by:  Weight gain.  Pregnancy hormones that are relaxing the joints in your pelvis.  A shift in weight and the muscles that support your balance.  Your breasts will continue to grow and they will continue to become tender.  Your gums may bleed and may be sensitive to brushing and flossing.  Dark spots or blotches (chloasma, mask of pregnancy) may develop on your face. This will likely fade after the baby is born.  A dark line  from your belly button to the pubic area (linea nigra) may appear. This will likely fade after the baby is born.  You may have changes in your hair. These can include thickening of your hair, rapid growth, and changes in texture. Some women also have hair loss during or after pregnancy, or hair that feels dry or thin. Your hair will most likely return to normal after your baby is born. What to expect at prenatal visits During a routine prenatal visit:  You will be weighed to make sure you and the fetus are growing normally.  Your blood pressure will be taken.  Your abdomen will be measured to track your baby's growth.  The fetal heartbeat will be listened to.  Any test results from the previous visit will be discussed. Your health care provider may ask you:  How you are feeling.  If you are feeling the baby move.  If you have had any abnormal symptoms, such as leaking fluid, bleeding, severe headaches, or abdominal cramping.  If you are using any tobacco products, including cigarettes, chewing tobacco, and electronic cigarettes.  If you have any questions. Other tests that may be performed during your second trimester include:  Blood tests that check for:  Low iron levels (anemia).  Gestational diabetes (between 24 and 28 weeks).  Rh antibodies. This is to check for a protein on red blood cells (Rh factor).  Urine tests to check for infections, diabetes, or protein in the urine.  An ultrasound to   confirm the proper growth and development of the baby.  An amniocentesis to check for possible genetic problems.  Fetal screens for spina bifida and Down syndrome.  HIV (human immunodeficiency virus) testing. Routine prenatal testing includes screening for HIV, unless you choose not to have this test. Follow these instructions at home: Eating and drinking  Continue to eat regular, healthy meals.  Avoid raw meat, uncooked cheese, cat litter boxes, and soil used by cats. These  carry germs that can cause birth defects in the baby.  Take your prenatal vitamins.  Take 1500-2000 mg of calcium daily starting at the 20th week of pregnancy until you deliver your baby.  If you develop constipation:  Take over-the-counter or prescription medicines.  Drink enough fluid to keep your urine clear or pale yellow.  Eat foods that are high in fiber, such as fresh fruits and vegetables, whole grains, and beans.  Limit foods that are high in fat and processed sugars, such as fried and sweet foods. Activity  Exercise only as directed by your health care provider. Experiencing uterine cramps is a good sign to stop exercising.  Avoid heavy lifting, wear low heel shoes, and practice good posture.  Wear your seat belt at all times when driving.  Rest with your legs elevated if you have leg cramps or low back pain.  Wear a good support bra for breast tenderness.  Do not use hot tubs, steam rooms, or saunas. Lifestyle  Avoid all smoking, herbs, alcohol, and unprescribed drugs. These chemicals affect the formation and growth of the baby.  Do not use any products that contain nicotine or tobacco, such as cigarettes and e-cigarettes. If you need help quitting, ask your health care provider.  A sexual relationship may be continued unless your health care provider directs you otherwise. General instructions  Follow your health care provider's instructions regarding medicine use. There are medicines that are either safe or unsafe to take during pregnancy.  Take warm sitz baths to soothe any pain or discomfort caused by hemorrhoids. Use hemorrhoid cream if your health care provider approves.  If you develop varicose veins, wear support hose. Elevate your feet for 15 minutes, 3-4 times a day. Limit salt in your diet.  Visit your dentist if you have not gone yet during your pregnancy. Use a soft toothbrush to brush your teeth and be gentle when you floss.  Keep all follow-up  prenatal visits as told by your health care provider. This is important. Contact a health care provider if:  You have dizziness.  You have mild pelvic cramps, pelvic pressure, or nagging pain in the abdominal area.  You have persistent nausea, vomiting, or diarrhea.  You have a bad smelling vaginal discharge.  You have pain with urination. Get help right away if:  You have a fever.  You are leaking fluid from your vagina.  You have spotting or bleeding from your vagina.  You have severe abdominal cramping or pain.  You have rapid weight gain or weight loss.  You have shortness of breath with chest pain.  You notice sudden or extreme swelling of your face, hands, ankles, feet, or legs.  You have not felt your baby move in over an hour.  You have severe headaches that do not go away with medicine.  You have vision changes. Summary  The second trimester is from week 13 through week 28 (months 4 through 6). It is also a time when the fetus is growing rapidly.  Your body goes   through many changes during pregnancy. The changes vary from woman to woman.  Avoid all smoking, herbs, alcohol, and unprescribed drugs. These chemicals affect the formation and growth your baby.  Do not use any tobacco products, such as cigarettes, chewing tobacco, and e-cigarettes. If you need help quitting, ask your health care provider.  Contact your health care provider if you have any questions. Keep all prenatal visits as told by your health care provider. This is important. This information is not intended to replace advice given to you by your health care provider. Make sure you discuss any questions you have with your health care provider. Document Released: 03/12/2001 Document Revised: 08/24/2015 Document Reviewed: 05/19/2012 [employer] Interactive Patient Education  2017 [employer]  

## 2016-03-19 NOTE — Progress Notes (Signed)
Subjective:  Kathryn Medina is a 25 y.o. G2P0010 at 6214w1d being seen today for ongoing prenatal care.  She is currently monitored for the following issues for this low-risk pregnancy and has Supervision of normal first pregnancy and Drug abuse during pregnancy on her problem list.  Patient reports no complaints.  Contractions: Not present. Vag. Bleeding: None.   . Denies leaking of fluid.   The following portions of the patient's history were reviewed and updated as appropriate: allergies, current medications, past family history, past medical history, past social history, past surgical history and problem list. Problem list updated.  Objective:   Vitals:   03/19/16 1059  BP: 114/70  Pulse: (!) 101  Weight: 148 lb 12.8 oz (67.5 kg)    Fetal Status: Fetal Heart Rate (bpm): 151         General:  Alert, oriented and cooperative. Patient is in no acute distress.  Skin: Skin is warm and dry. No rash noted.   Cardiovascular: Normal heart rate noted  Respiratory: Normal respiratory effort, no problems with respiration noted  Abdomen: Soft, gravid, appropriate for gestational age. Pain/Pressure: Absent     Pelvic:  Cervical exam deferred        Extremities: Normal range of motion.  Edema: None  Mental Status: Normal mood and affect. Normal behavior. Normal judgment and thought content.   Urinalysis:      Assessment and Plan:  Pregnancy: G2P0010 at 6414w1d  1. Encounter for supervision of normal first pregnancy in second trimester Decline flu vaccine - US MFM OB COMP + 14 WK; Future  2. Drug abuse during pregnancy Denies use now. SW has spoken to pt Pt aware of follow UDS later in pregnancy  Preterm labor symptoms and general obstetric precautions including but not limited to vaginal bleeding, contractions, leaking of fluid and fetal movement were reviewed in detail with the patient. Please refer to After Visit Summary for other counseling recommendations.  Return in about 4 weeks  (around 04/16/2016) for OB visit.   Hermina StaggersMichael L Charmaine Placido, MD

## 2016-03-27 ENCOUNTER — Telehealth: Payer: Self-pay

## 2016-03-27 ENCOUNTER — Encounter (HOSPITAL_COMMUNITY): Payer: Self-pay | Admitting: Obstetrics and Gynecology

## 2016-03-27 MED ORDER — PRENATAL PLUS 27-1 MG PO TABS: 1.0000 | ORAL_TABLET | Freq: Every day | ORAL | 2 refills | Status: DC

## 2016-03-27 NOTE — Telephone Encounter (Signed)
Returned call to advise that rx for prenatals sent. No answer, and no option to leave a voicemail.

## 2016-04-01 NOTE — L&D Delivery Note (Signed)
Operative Delivery Note At 1:17 AM a viable female was delivered via Vaginal, Vacuum [employer], operational(Extractor).  Presentation: vertex; Position: Left,, Occiput,, Anterior; Station: +3. At time of delivery of head, port wine stained fluid appeared, suspicious for placental abruption, potentially from recent bradycardia just prior to delivery.   Verbal consent: obtained from patient.  Risks and benefits discussed in detail.  Risks include, but are not limited to the risks of anesthesia, bleeding, infection, damage to maternal tissues, fetal cephalhematoma.  There is also the risk of inability to effect vaginal delivery of the head, or shoulder dystocia that cannot be resolved by established maneuvers, leading to the need for emergency cesarean section.  APGAR: 3, 9; weight 6 lb 4.7 oz (2855 g).   Placenta status: Spontaneous, intact.  To path for suspected abruption Cord: 3 vessels with the following complications: nuchal x1.  Cord pH: 7.08  Anesthesia:  Epidural, lidocaine Instruments: Kiwi Episiotomy:  n/a Lacerations: 2nd degree Suture Repair: monocryl Est. Blood Loss (mL): 250  Mom to postpartum.  Baby to Couplet care / Skin to Skin.  Cleone SlimCaroline Ishmael Berkovich SNM 08/29/2016, 2:24 AM

## 2016-04-08 ENCOUNTER — Ambulatory Visit (HOSPITAL_COMMUNITY)
Admission: RE | Admit: 2016-04-08 | Discharge: 2016-04-08 | Disposition: A | Discharge status: Home / Self Care | Payer: Medicaid Other | Source: Ambulatory Visit | Attending: Obstetrics and Gynecology | Admitting: Obstetrics and Gynecology

## 2016-04-08 DIAGNOSIS — Z3A19 19 weeks gestation of pregnancy: Secondary | ICD-10-CM | POA: Diagnosis not present

## 2016-04-08 DIAGNOSIS — Z3402 Encounter for supervision of normal first pregnancy, second trimester: Secondary | ICD-10-CM

## 2016-04-08 DIAGNOSIS — Z363 Encounter for antenatal screening for malformations: Secondary | ICD-10-CM | POA: Diagnosis present

## 2016-04-16 ENCOUNTER — Ambulatory Visit (INDEPENDENT_AMBULATORY_CARE_PROVIDER_SITE_OTHER): Payer: Medicaid Other | Admitting: Obstetrics & Gynecology

## 2016-04-16 DIAGNOSIS — O26892 Other specified pregnancy related conditions, second trimester: Secondary | ICD-10-CM

## 2016-04-16 DIAGNOSIS — O99891 Other specified diseases and conditions complicating pregnancy: Secondary | ICD-10-CM | POA: Insufficient documentation

## 2016-04-16 DIAGNOSIS — M549 Dorsalgia, unspecified: Secondary | ICD-10-CM

## 2016-04-16 DIAGNOSIS — O99322 Drug use complicating pregnancy, second trimester: Secondary | ICD-10-CM | POA: Diagnosis not present

## 2016-04-16 DIAGNOSIS — F1910 Other psychoactive substance abuse, uncomplicated: Secondary | ICD-10-CM | POA: Diagnosis not present

## 2016-04-16 DIAGNOSIS — Z3402 Encounter for supervision of normal first pregnancy, second trimester: Secondary | ICD-10-CM

## 2016-04-16 DIAGNOSIS — O9989 Other specified diseases and conditions complicating pregnancy, childbirth and the puerperium: Secondary | ICD-10-CM

## 2016-04-16 MED ORDER — CYCLOBENZAPRINE HCL 5 MG PO TABS: 5.0000 mg | ORAL_TABLET | Freq: Three times a day (TID) | ORAL | 1 refills | Status: DC | PRN

## 2016-04-16 NOTE — Progress Notes (Signed)
   PRENATAL VISIT NOTE  Subjective:  Kathryn Medina is a 26 y.o. G2P0010 at 188w1d being seen today for ongoing prenatal care.  She is currently monitored for the following issues for this low-risk pregnancy and has Supervision of normal first pregnancy; Drug abuse during pregnancy; and Back pain affecting pregnancy on her problem list.  Patient reports backache.  Contractions: Not present. Vag. Bleeding: None.   . Denies leaking of fluid.   The following portions of the patient's history were reviewed and updated as appropriate: allergies, current medications, past family history, past medical history, past social history, past surgical history and problem list. Problem list updated.  Objective:   Vitals:   04/16/16 1105  BP: 104/64  Pulse: 81  Temp: 97.9 F (36.6 C)  Weight: 151 lb (68.5 kg)    Fetal Status:           General:  Alert, oriented and cooperative. Patient is in no acute distress.  Skin: Skin is warm and dry. No rash noted.   Cardiovascular: Normal heart rate noted  Respiratory: Normal respiratory effort, no problems with respiration noted  Abdomen: Soft, gravid, appropriate for gestational age. Pain/Pressure: Present     Pelvic:  Cervical exam deferred        Extremities: Normal range of motion.  Edema: None  Mental Status: Normal mood and affect. Normal behavior. Normal judgment and thought content.   Assessment and Plan:  Pregnancy: G2P0010 at 4488w1d  1. Encounter for supervision of normal first pregnancy in second trimester Normal anatomy US last week  2. Back pain affecting pregnancy in second trimester H/o positive THC and oxycodone, no prescriptions in database noted. Will need chiro or PT evaluation if back pain continues - cyclobenzaprine (FLEXERIL) 5 MG tablet; Take 1 tablet (5 mg total) by mouth 3 (three) times daily as needed for muscle spasms.  Dispense: 30 tablet; Refill: 1  Preterm labor symptoms and general obstetric precautions including but  not limited to vaginal bleeding, contractions, leaking of fluid and fetal movement were reviewed in detail with the patient. Please refer to After Visit Summary for other counseling recommendations.  Return in about 4 weeks (around 05/14/2016).   Adam PhenixJames G Koven Belinsky, MD

## 2016-04-16 NOTE — Patient Instructions (Signed)
Second Trimester of Pregnancy The second trimester is from week 13 through week 28, month 4 through 6. This is often the time in pregnancy that you feel your best. Often times, morning sickness has lessened or quit. You may have more energy, and you may get hungry more often. Your unborn baby (fetus) is growing rapidly. At the end of the sixth month, he or she is about 9 inches long and weighs about 1 pounds. You will likely feel the baby move (quickening) between 18 and 20 weeks of pregnancy. Follow these instructions at home:  Avoid all smoking, herbs, and alcohol. Avoid drugs not approved by your doctor.  Do not use any tobacco products, including cigarettes, chewing tobacco, and electronic cigarettes. If you need help quitting, ask your doctor. You may get counseling or other support to help you quit.  Only take medicine as told by your doctor. Some medicines are safe and some are not during pregnancy.  Exercise only as told by your doctor. Stop exercising if you start having cramps.  Eat regular, healthy meals.  Wear a good support bra if your breasts are tender.  Do not use hot tubs, steam rooms, or saunas.  Wear your seat belt when driving.  Avoid raw meat, uncooked cheese, and liter boxes and soil used by cats.  Take your prenatal vitamins.  Take 1500-2000 milligrams of calcium daily starting at the 20th week of pregnancy until you deliver your baby.  Try taking medicine that helps you poop (stool softener) as needed, and if your doctor approves. Eat more fiber by eating fresh fruit, vegetables, and whole grains. Drink enough fluids to keep your pee (urine) clear or pale yellow.  Take warm water baths (sitz baths) to soothe pain or discomfort caused by hemorrhoids. Use hemorrhoid cream if your doctor approves.  If you have puffy, bulging veins (varicose veins), wear support hose. Raise (elevate) your feet for 15 minutes, 3-4 times a day. Limit salt in your diet.  Avoid heavy  lifting, wear low heals, and sit up straight.  Rest with your legs raised if you have leg cramps or low back pain.  Visit your dentist if you have not gone during your pregnancy. Use a soft toothbrush to brush your teeth. Be gentle when you floss.  You can have sex (intercourse) unless your doctor tells you not to.  Go to your doctor visits. Get help if:  You feel dizzy.  You have mild cramps or pressure in your lower belly (abdomen).  You have a nagging pain in your belly area.  You continue to feel sick to your stomach (nauseous), throw up (vomit), or have watery poop (diarrhea).  You have bad smelling fluid coming from your vagina.  You have pain with peeing (urination). Get help right away if:  You have a fever.  You are leaking fluid from your vagina.  You have spotting or bleeding from your vagina.  You have severe belly cramping or pain.  You lose or gain weight rapidly.  You have trouble catching your breath and have chest pain.  You notice sudden or extreme puffiness (swelling) of your face, hands, ankles, feet, or legs.  You have not felt the baby move in over an hour.  You have severe headaches that do not go away with medicine.  You have vision changes. This information is not intended to replace advice given to you by your health care provider. Make sure you discuss any questions you have with your health care   provider. Document Released: 06/12/2009 Document Revised: 08/24/2015 Document Reviewed: 05/19/2012 Elsevier Interactive Patient Education  2017 [employer]  

## 2016-05-14 ENCOUNTER — Ambulatory Visit (INDEPENDENT_AMBULATORY_CARE_PROVIDER_SITE_OTHER): Payer: Medicaid Other | Admitting: Obstetrics & Gynecology

## 2016-05-14 VITALS — BP 113/68 | HR 69 | Wt 155.3 lb

## 2016-05-14 DIAGNOSIS — M549 Dorsalgia, unspecified: Secondary | ICD-10-CM | POA: Diagnosis not present

## 2016-05-14 DIAGNOSIS — O26892 Other specified pregnancy related conditions, second trimester: Secondary | ICD-10-CM | POA: Diagnosis not present

## 2016-05-14 DIAGNOSIS — O99322 Drug use complicating pregnancy, second trimester: Secondary | ICD-10-CM | POA: Diagnosis not present

## 2016-05-14 DIAGNOSIS — O9989 Other specified diseases and conditions complicating pregnancy, childbirth and the puerperium: Secondary | ICD-10-CM

## 2016-05-14 DIAGNOSIS — F1910 Other psychoactive substance abuse, uncomplicated: Secondary | ICD-10-CM | POA: Diagnosis not present

## 2016-05-14 DIAGNOSIS — Z3402 Encounter for supervision of normal first pregnancy, second trimester: Secondary | ICD-10-CM

## 2016-05-14 NOTE — Progress Notes (Signed)
   PRENATAL VISIT NOTE  Subjective:  Kathryn Medina is a 26 y.o. G2P0010 at 2315w1d being seen today for ongoing prenatal care.  She is currently monitored for the following issues for this low-risk pregnancy and has Supervision of normal first pregnancy; Drug abuse during pregnancy; and Back pain affecting pregnancy on her problem list.  Patient reports backache.  Contractions: Not present. Vag. Bleeding: None.  Movement: Present. Denies leaking of fluid.   The following portions of the patient's history were reviewed and updated as appropriate: allergies, current medications, past family history, past medical history, past social history, past surgical history and problem list. Problem list updated.  Objective:   Vitals:   05/14/16 1317  BP: 113/68  Pulse: 69  Weight: 155 lb 4.8 oz (70.4 kg)    Fetal Status: Fetal Heart Rate (bpm): 149   Movement: Present     General:  Alert, oriented and cooperative. Patient is in no acute distress.  Skin: Skin is warm and dry. No rash noted.   Cardiovascular: Normal heart rate noted  Respiratory: Normal respiratory effort, no problems with respiration noted  Abdomen: Soft, gravid, appropriate for gestational age. Pain/Pressure: Absent     Pelvic:  Cervical exam deferred        Extremities: Normal range of motion.  Edema: None  Mental Status: Normal mood and affect. Normal behavior. Normal judgment and thought content.   Assessment and Plan  Pregnancy: G2P0010 at 9415w1d  1. Encounter for supervision of normal first pregnancy in second trimester H/o pos drug screen  2. Back pain affecting pregnancy in second trimester Tylenol or ibuprofen ok to try, heat and cold packs  Preterm labor symptoms and general obstetric precautions including but not limited to vaginal bleeding, contractions, leaking of fluid and fetal movement were reviewed in detail with the patient. Please refer to After Visit Summary for other counseling recommendations.    Return in about 4 weeks (around 06/11/2016) for 2 hr GTT.   Adam PhenixJames G Arnold, MD

## 2016-05-14 NOTE — Patient Instructions (Signed)
Second Trimester of Pregnancy The second trimester is from week 13 through week 28 (months 4 through 6). The second trimester is often a time when you feel your best. Your body has also adjusted to being pregnant, and you begin to feel better physically. Usually, morning sickness has lessened or quit completely, you may have more energy, and you may have an increase in appetite. The second trimester is also a time when the fetus is growing rapidly. At the end of the sixth month, the fetus is about 9 inches long and weighs about 1 pounds. You will likely begin to feel the baby move (quickening) between 18 and 20 weeks of the pregnancy. Body changes during your second trimester Your body continues to go through many changes during your second trimester. The changes vary from woman to woman.  Your weight will continue to increase. You will notice your lower abdomen bulging out.  You may begin to get stretch marks on your hips, abdomen, and breasts.  You may develop headaches that can be relieved by medicines. The medicines should be approved by your health care provider.  You may urinate more often because the fetus is pressing on your bladder.  You may develop or continue to have heartburn as a result of your pregnancy.  You may develop constipation because certain hormones are causing the muscles that push waste through your intestines to slow down.  You may develop hemorrhoids or swollen, bulging veins (varicose veins).  You may have back pain. This is caused by:  Weight gain.  Pregnancy hormones that are relaxing the joints in your pelvis.  A shift in weight and the muscles that support your balance.  Your breasts will continue to grow and they will continue to become tender.  Your gums may bleed and may be sensitive to brushing and flossing.  Dark spots or blotches (chloasma, mask of pregnancy) may develop on your face. This will likely fade after the baby is born.  A dark line  from your belly button to the pubic area (linea nigra) may appear. This will likely fade after the baby is born.  You may have changes in your hair. These can include thickening of your hair, rapid growth, and changes in texture. Some women also have hair loss during or after pregnancy, or hair that feels dry or thin. Your hair will most likely return to normal after your baby is born. What to expect at prenatal visits During a routine prenatal visit:  You will be weighed to make sure you and the fetus are growing normally.  Your blood pressure will be taken.  Your abdomen will be measured to track your baby's growth.  The fetal heartbeat will be listened to.  Any test results from the previous visit will be discussed. Your health care provider may ask you:  How you are feeling.  If you are feeling the baby move.  If you have had any abnormal symptoms, such as leaking fluid, bleeding, severe headaches, or abdominal cramping.  If you are using any tobacco products, including cigarettes, chewing tobacco, and electronic cigarettes.  If you have any questions. Other tests that may be performed during your second trimester include:  Blood tests that check for:  Low iron levels (anemia).  Gestational diabetes (between 24 and 28 weeks).  Rh antibodies. This is to check for a protein on red blood cells (Rh factor).  Urine tests to check for infections, diabetes, or protein in the urine.  An ultrasound to   confirm the proper growth and development of the baby.  An amniocentesis to check for possible genetic problems.  Fetal screens for spina bifida and Down syndrome.  HIV (human immunodeficiency virus) testing. Routine prenatal testing includes screening for HIV, unless you choose not to have this test. Follow these instructions at home: Eating and drinking  Continue to eat regular, healthy meals.  Avoid raw meat, uncooked cheese, cat litter boxes, and soil used by cats. These  carry germs that can cause birth defects in the baby.  Take your prenatal vitamins.  Take 1500-2000 mg of calcium daily starting at the 20th week of pregnancy until you deliver your baby.  If you develop constipation:  Take over-the-counter or prescription medicines.  Drink enough fluid to keep your urine clear or pale yellow.  Eat foods that are high in fiber, such as fresh fruits and vegetables, whole grains, and beans.  Limit foods that are high in fat and processed sugars, such as fried and sweet foods. Activity  Exercise only as directed by your health care provider. Experiencing uterine cramps is a good sign to stop exercising.  Avoid heavy lifting, wear low heel shoes, and practice good posture.  Wear your seat belt at all times when driving.  Rest with your legs elevated if you have leg cramps or low back pain.  Wear a good support bra for breast tenderness.  Do not use hot tubs, steam rooms, or saunas. Lifestyle  Avoid all smoking, herbs, alcohol, and unprescribed drugs. These chemicals affect the formation and growth of the baby.  Do not use any products that contain nicotine or tobacco, such as cigarettes and e-cigarettes. If you need help quitting, ask your health care provider.  A sexual relationship may be continued unless your health care provider directs you otherwise. General instructions  Follow your health care provider's instructions regarding medicine use. There are medicines that are either safe or unsafe to take during pregnancy.  Take warm sitz baths to soothe any pain or discomfort caused by hemorrhoids. Use hemorrhoid cream if your health care provider approves.  If you develop varicose veins, wear support hose. Elevate your feet for 15 minutes, 3-4 times a day. Limit salt in your diet.  Visit your dentist if you have not gone yet during your pregnancy. Use a soft toothbrush to brush your teeth and be gentle when you floss.  Keep all follow-up  prenatal visits as told by your health care provider. This is important. Contact a health care provider if:  You have dizziness.  You have mild pelvic cramps, pelvic pressure, or nagging pain in the abdominal area.  You have persistent nausea, vomiting, or diarrhea.  You have a bad smelling vaginal discharge.  You have pain with urination. Get help right away if:  You have a fever.  You are leaking fluid from your vagina.  You have spotting or bleeding from your vagina.  You have severe abdominal cramping or pain.  You have rapid weight gain or weight loss.  You have shortness of breath with chest pain.  You notice sudden or extreme swelling of your face, hands, ankles, feet, or legs.  You have not felt your baby move in over an hour.  You have severe headaches that do not go away with medicine.  You have vision changes. Summary  The second trimester is from week 13 through week 28 (months 4 through 6). It is also a time when the fetus is growing rapidly.  Your body goes   through many changes during pregnancy. The changes vary from woman to woman.  Avoid all smoking, herbs, alcohol, and unprescribed drugs. These chemicals affect the formation and growth your baby.  Do not use any tobacco products, such as cigarettes, chewing tobacco, and e-cigarettes. If you need help quitting, ask your health care provider.  Contact your health care provider if you have any questions. Keep all prenatal visits as told by your health care provider. This is important. This information is not intended to replace advice given to you by your health care provider. Make sure you discuss any questions you have with your health care provider. Document Released: 03/12/2001 Document Revised: 08/24/2015 Document Reviewed: 05/19/2012 [employer] Interactive Patient Education  2017 [employer]  

## 2016-05-22 ENCOUNTER — Telehealth: Payer: Self-pay

## 2016-05-22 NOTE — Telephone Encounter (Signed)
Patient called stated that since last night she has been feeling sick, vomiting, sweating and she thinks she has a fever. Advised patient to go to hospital as soon as possible to be evaluated, patient agreed.

## 2016-06-04 ENCOUNTER — Telehealth: Payer: Self-pay

## 2016-06-04 DIAGNOSIS — Z3A27 27 weeks gestation of pregnancy: Secondary | ICD-10-CM

## 2016-06-04 MED ORDER — PRENATAL PLUS 27-1 MG PO TABS: 1.0000 | ORAL_TABLET | Freq: Every day | ORAL | 6 refills | Status: DC

## 2016-06-04 NOTE — Telephone Encounter (Signed)
Returned call to advise that refill on PNV was sent, no answer, and no option to leave vm.

## 2016-06-11 ENCOUNTER — Encounter: Payer: Medicaid Other | Admitting: Obstetrics and Gynecology

## 2016-06-11 ENCOUNTER — Other Ambulatory Visit: Payer: Medicaid Other

## 2016-06-13 ENCOUNTER — Encounter: Payer: Medicaid Other | Admitting: Obstetrics and Gynecology

## 2016-06-18 ENCOUNTER — Encounter: Payer: Self-pay | Admitting: Obstetrics

## 2016-06-18 ENCOUNTER — Other Ambulatory Visit: Payer: Medicaid Other

## 2016-06-18 ENCOUNTER — Ambulatory Visit (INDEPENDENT_AMBULATORY_CARE_PROVIDER_SITE_OTHER): Payer: Medicaid Other | Admitting: Obstetrics

## 2016-06-18 VITALS — BP 109/66 | HR 76 | Wt 156.0 lb

## 2016-06-18 DIAGNOSIS — F1920 Other psychoactive substance dependence, uncomplicated: Secondary | ICD-10-CM

## 2016-06-18 DIAGNOSIS — O2390 Unspecified genitourinary tract infection in pregnancy, unspecified trimester: Secondary | ICD-10-CM

## 2016-06-18 DIAGNOSIS — O365930 Maternal care for other known or suspected poor fetal growth, third trimester, not applicable or unspecified: Secondary | ICD-10-CM | POA: Diagnosis not present

## 2016-06-18 DIAGNOSIS — O99322 Drug use complicating pregnancy, second trimester: Secondary | ICD-10-CM

## 2016-06-18 DIAGNOSIS — Z3402 Encounter for supervision of normal first pregnancy, second trimester: Secondary | ICD-10-CM

## 2016-06-18 DIAGNOSIS — O99320 Drug use complicating pregnancy, unspecified trimester: Secondary | ICD-10-CM

## 2016-06-18 DIAGNOSIS — O2392 Unspecified genitourinary tract infection in pregnancy, second trimester: Secondary | ICD-10-CM

## 2016-06-18 DIAGNOSIS — O2393 Unspecified genitourinary tract infection in pregnancy, third trimester: Secondary | ICD-10-CM

## 2016-06-18 DIAGNOSIS — F1910 Other psychoactive substance abuse, uncomplicated: Secondary | ICD-10-CM | POA: Diagnosis not present

## 2016-06-18 DIAGNOSIS — O99323 Drug use complicating pregnancy, third trimester: Secondary | ICD-10-CM | POA: Diagnosis not present

## 2016-06-18 NOTE — Progress Notes (Signed)
Pt presents for ROB and 2 gtt. She declined Tdap today. Pt requests another u/s.

## 2016-06-18 NOTE — Progress Notes (Signed)
Subjective:  Kathryn Medina is a 26 y.o. G2P0010 at 7542w1d being seen today for ongoing prenatal care.  She is currently monitored for the following issues for this low-risk pregnancy and has Supervision of normal first pregnancy; Drug abuse during pregnancy; and Back pain affecting pregnancy on her problem list.  Patient reports no complaints.  Contractions: Not present. Vag. Bleeding: None.  Movement: Present. Denies leaking of fluid.   The following portions of the patient's history were reviewed and updated as appropriate: allergies, current medications, past family history, past medical history, past social history, past surgical history and problem list. Problem list updated.  Objective:   Vitals:   06/18/16 1011  BP: 109/66  Pulse: 76  Weight: 156 lb (70.8 kg)    Fetal Status: Fetal Heart Rate (bpm): 150   Movement: Present     General:  Alert, oriented and cooperative. Patient is in no acute distress.  Skin: Skin is warm and dry. No rash noted.   Cardiovascular: Normal heart rate noted  Respiratory: Normal respiratory effort, no problems with respiration noted  Abdomen: Soft, gravid, appropriate for gestational age. Pain/Pressure: Absent     Pelvic:  Cervical exam deferred        Extremities: Normal range of motion.  Edema: None  Mental Status: Normal mood and affect. Normal behavior. Normal judgment and thought content.   Urinalysis:      Assessment and Plan:  Pregnancy: G2P0010 at 6542w1d  1. Encounter for supervision of normal first pregnancy in second trimester Rx: - CBC - HIV antibody - RPR - Glucose Tolerance, 2 Hours w/1 Hour  2. SGA (small for gestational age) Rx: - US MFM OB FOLLOW UP; Future  Preterm labor symptoms and general obstetric precautions including but not limited to vaginal bleeding, contractions, leaking of fluid and fetal movement were reviewed in detail with the patient. Please refer to After Visit Summary for other counseling  recommendations.  F/U in 2 weeks   Brock Badharles A Deovion Batrez, MDPatient ID: Kathryn Medina, female   DOB: 08/15/1990, 26 y.o.   MRN: [id number]

## 2016-06-19 LAB — CBC
HEMOGLOBIN: 10.8 g/dL — AB (ref 11.1–15.9)
Hematocrit: 32.9 % — ABNORMAL LOW (ref 34.0–46.6)
MCH: 32 pg (ref 26.6–33.0)
MCHC: 32.8 g/dL (ref 31.5–35.7)
MCV: 97 fL (ref 79–97)
Platelets: 206 10*3/uL (ref 150–379)
RBC: 3.38 x10E6/uL — ABNORMAL LOW (ref 3.77–5.28)
RDW: 12.7 % (ref 12.3–15.4)
WBC: 13.6 10*3/uL — ABNORMAL HIGH (ref 3.4–10.8)

## 2016-06-19 LAB — GLUCOSE TOLERANCE, 2 HOURS W/ 1HR
GLUCOSE, 2 HOUR: 84 mg/dL (ref 65–152)
Glucose, 1 hour: 167 mg/dL (ref 65–179)
Glucose, Fasting: 87 mg/dL (ref 65–91)

## 2016-06-19 LAB — RPR: RPR Ser Ql: NONREACTIVE

## 2016-06-19 LAB — HIV ANTIBODY (ROUTINE TESTING W REFLEX): HIV SCREEN 4TH GENERATION: NONREACTIVE

## 2016-06-24 ENCOUNTER — Other Ambulatory Visit: Payer: Self-pay | Admitting: Obstetrics

## 2016-06-24 ENCOUNTER — Ambulatory Visit (HOSPITAL_COMMUNITY)
Admission: RE | Admit: 2016-06-24 | Discharge: 2016-06-24 | Disposition: A | Discharge status: Home / Self Care | Payer: Medicaid Other | Source: Ambulatory Visit | Attending: Obstetrics | Admitting: Obstetrics

## 2016-06-24 DIAGNOSIS — Z362 Encounter for other antenatal screening follow-up: Secondary | ICD-10-CM | POA: Insufficient documentation

## 2016-06-24 DIAGNOSIS — O26843 Uterine size-date discrepancy, third trimester: Secondary | ICD-10-CM

## 2016-06-24 DIAGNOSIS — O99323 Drug use complicating pregnancy, third trimester: Secondary | ICD-10-CM | POA: Insufficient documentation

## 2016-06-24 DIAGNOSIS — Z3A30 30 weeks gestation of pregnancy: Secondary | ICD-10-CM | POA: Diagnosis not present

## 2016-06-24 DIAGNOSIS — O99333 Smoking (tobacco) complicating pregnancy, third trimester: Secondary | ICD-10-CM | POA: Insufficient documentation

## 2016-07-03 ENCOUNTER — Encounter: Payer: Self-pay | Admitting: Obstetrics

## 2016-07-03 ENCOUNTER — Ambulatory Visit (INDEPENDENT_AMBULATORY_CARE_PROVIDER_SITE_OTHER): Payer: Medicaid Other | Admitting: Obstetrics

## 2016-07-03 VITALS — BP 110/64 | HR 78 | Wt 161.0 lb

## 2016-07-03 DIAGNOSIS — O99323 Drug use complicating pregnancy, third trimester: Secondary | ICD-10-CM

## 2016-07-03 DIAGNOSIS — Z87440 Personal history of urinary (tract) infections: Secondary | ICD-10-CM

## 2016-07-03 DIAGNOSIS — O09899 Supervision of other high risk pregnancies, unspecified trimester: Secondary | ICD-10-CM

## 2016-07-03 DIAGNOSIS — O99320 Drug use complicating pregnancy, unspecified trimester: Secondary | ICD-10-CM

## 2016-07-03 DIAGNOSIS — F1920 Other psychoactive substance dependence, uncomplicated: Secondary | ICD-10-CM

## 2016-07-03 NOTE — Progress Notes (Signed)
Subjective:  Kathryn Medina is a 26 y.o. G2P0010 at [redacted]w[redacted]d being seen today for ongoing prenatal care.  She is currently monitored for the following issues for this high-risk pregnancy and has Supervision of normal first pregnancy; Drug abuse during pregnancy; and Back pain affecting pregnancy on her problem list.  Patient reports no complaints.  Contractions: Not present. Vag. Bleeding: None.  Movement: Present. Denies leaking of fluid.   The following portions of the patient's history were reviewed and updated as appropriate: allergies, current medications, past family history, past medical history, past social history, past surgical history and problem list. Problem list updated.  Objective:   Vitals:   07/03/16 1045  BP: 110/64  Pulse: 78  Weight: 161 lb (73 kg)    Fetal Status: Fetal Heart Rate (bpm): 150   Movement: Present     General:  Alert, oriented and cooperative. Patient is in no acute distress.  Skin: Skin is warm and dry. No rash noted.   Cardiovascular: Normal heart rate noted  Respiratory: Normal respiratory effort, no problems with respiration noted  Abdomen: Soft, gravid, appropriate for gestational age. Pain/Pressure: Present     Pelvic:  Cervical exam deferred        Extremities: Normal range of motion.  Edema: None  Mental Status: Normal mood and affect. Normal behavior. Normal judgment and thought content.   Urinalysis:      Assessment and Plan:  Pregnancy: G2P0010 at [redacted]w[redacted]d  1. History of UTI Rx: - Culture, OB Urine  2. Supervision of other high risk pregnancy, antepartum   3. Drug dependence, antepartum (HCC)   Preterm labor symptoms and general obstetric precautions including but not limited to vaginal bleeding, contractions, leaking of fluid and fetal movement were reviewed in detail with the patient. Please refer to After Visit Summary for other counseling recommendations.  No Follow-up on file.   Brock Bad, MDPatient ID: Kathryn Medina, female   DOB: [birth year], 26 y.o.   MRN: [id number]

## 2016-07-05 LAB — URINE CULTURE, OB REFLEX

## 2016-07-05 LAB — CULTURE, OB URINE

## 2016-07-18 ENCOUNTER — Ambulatory Visit (INDEPENDENT_AMBULATORY_CARE_PROVIDER_SITE_OTHER): Payer: Medicaid Other | Admitting: Obstetrics

## 2016-07-18 ENCOUNTER — Encounter: Payer: Self-pay | Admitting: *Deleted

## 2016-07-18 VITALS — BP 118/73 | HR 80 | Wt 160.0 lb

## 2016-07-18 DIAGNOSIS — Z3480 Encounter for supervision of other normal pregnancy, unspecified trimester: Secondary | ICD-10-CM

## 2016-07-18 DIAGNOSIS — Z3483 Encounter for supervision of other normal pregnancy, third trimester: Secondary | ICD-10-CM

## 2016-07-18 NOTE — Progress Notes (Signed)
Subjective:  Kathryn Medina is a 26 y.o. G2P0010 at [redacted]w[redacted]d being seen today for ongoing prenatal care.  She is currently monitored for the following issues for this low-risk pregnancy and has Supervision of normal first pregnancy; Drug abuse during pregnancy; and Back pain affecting pregnancy on her problem list.  Patient reports no complaints.  Contractions: Not present. Vag. Bleeding: None.  Movement: Present. Denies leaking of fluid.   The following portions of the patient's history were reviewed and updated as appropriate: allergies, current medications, past family history, past medical history, past social history, past surgical history and problem list. Problem list updated.  Objective:   Vitals:   07/18/16 1114  BP: 118/73  Pulse: 80  Weight: 160 lb (72.6 kg)    Fetal Status:     Movement: Present     General:  Alert, oriented and cooperative. Patient is in no acute distress.  Skin: Skin is warm and dry. No rash noted.   Cardiovascular: Normal heart rate noted  Respiratory: Normal respiratory effort, no problems with respiration noted  Abdomen: Soft, gravid, appropriate for gestational age. Pain/Pressure: Present     Pelvic:  Cervical exam deferred        Extremities: Normal range of motion.     Mental Status: Normal mood and affect. Normal behavior. Normal judgment and thought content.   Urinalysis:      Assessment and Plan:  Pregnancy: G2P0010 at [redacted]w[redacted]d  There are no diagnoses linked to this encounter. Preterm labor symptoms and general obstetric precautions including but not limited to vaginal bleeding, contractions, leaking of fluid and fetal movement were reviewed in detail with the patient. Please refer to After Visit Summary for other counseling recommendations.  Return in 2 weeks (on 08/01/2016).   Brock Bad, MDPatient ID: Kathryn Medina, female   DOB: 16-May-1990, 26 y.o.   MRN: [id number] Patient ID: Kathryn Medina, female   DOB: 1991/01/01, 26 y.o.   MRN:  [id number]

## 2016-07-18 NOTE — Progress Notes (Signed)
Pt states that she has not felt good over the past few days.   Pt states that she feels lightheaded and exhausted. Pt states she has been having some tingling and her body just feels "loose". Last Hemoglobin check was 05/2016, result was 10.8.

## 2016-07-19 ENCOUNTER — Encounter: Payer: Self-pay | Admitting: Obstetrics

## 2016-07-25 ENCOUNTER — Emergency Department (HOSPITAL_COMMUNITY)
Admission: EM | Admit: 2016-07-25 | Discharge: 2016-07-25 | Disposition: A | Discharge status: Home / Self Care | Payer: Medicaid Other | Attending: Emergency Medicine | Admitting: Emergency Medicine

## 2016-07-25 ENCOUNTER — Encounter (HOSPITAL_COMMUNITY): Payer: Self-pay | Admitting: Emergency Medicine

## 2016-07-25 DIAGNOSIS — F17210 Nicotine dependence, cigarettes, uncomplicated: Secondary | ICD-10-CM | POA: Diagnosis not present

## 2016-07-25 DIAGNOSIS — Z79899 Other long term (current) drug therapy: Secondary | ICD-10-CM | POA: Diagnosis not present

## 2016-07-25 DIAGNOSIS — J45909 Unspecified asthma, uncomplicated: Secondary | ICD-10-CM | POA: Diagnosis not present

## 2016-07-25 DIAGNOSIS — H60391 Other infective otitis externa, right ear: Secondary | ICD-10-CM | POA: Insufficient documentation

## 2016-07-25 DIAGNOSIS — H9201 Otalgia, right ear: Secondary | ICD-10-CM | POA: Diagnosis present

## 2016-07-25 MED ORDER — NEOMYCIN-POLYMYXIN-HC 3.5-10000-1 OT SUSP: 4.0000 [drp] | Freq: Four times a day (QID) | OTIC | 0 refills | Status: AC

## 2016-07-25 NOTE — ED Triage Notes (Signed)
Pt awoke this morning feeling "sick" but felt this was just her normal morning sickness.  Shortly after experienced a "pop" in her right ear followed by pain in her throat and ear.  She put a q-tip in her ear and removed "black" discharge from her ear that "didn't smell too good either"  No other complaints at this time.

## 2016-07-25 NOTE — Discharge Instructions (Signed)
Apply your antibiotic ear drops as prescribed for the next 7 days. You may also take Tylenol as prescribed over the counter as needed for pain relief.  I recommend follow up with your OB at your scheduled appointment next week for follow up evaluation.  Please return to the Emergency Department if symptoms worsen or new onset of fever, swelling/redness around your ear, ear drainage/bleeding, headache, neck stiffness, rash, abdominal pain, vomiting.

## 2016-07-25 NOTE — ED Notes (Signed)
OB Rapid response RN at bedside.   

## 2016-07-25 NOTE — ED Notes (Signed)
ED Provider at bedside. 

## 2016-07-25 NOTE — ED Provider Notes (Signed)
[facility] DEPT Provider Note   CSN: [id number] Arrival date & time: 07/25/16  7829     History   Chief Complaint Chief Complaint  Patient presents with  . Otalgia    HPI Kathryn Medina is a 26 y.o. female.  HPI   Patient is a 26 year old female with no pertinent past medical history presents the ED with complaint of right ear pain. Patient states she is currently 8 months pregnant. She states this morning when she woke up she noticed dark black drainage to the outside of her right ear and reports having associated pain and muffled hearing. Patient reports using peroxide and a Q-tip to try to remove some of the drainage without improvement of symptoms. Endorses malodorous earwax which has been present for the past 6-8 months. She also reports having associated scratchy/sore throat this morning. Denies fever, facial swelling, loss of hearing, rhinorrhea, nasal congestion, trismus, drooling, neck pain/stiffness, headache, rash, cough, difficulty breathing, chest pain, abdominal pain, vomiting. Patient denies taking any medications prior to arrival. She notes she was last seen by her OB last week normal appointment and denies any consultations with her pregnancy.   Past Medical History:  Diagnosis Date  . Asthma     Patient Active Problem List   Diagnosis Date Noted  . Back pain affecting pregnancy 04/16/2016  . Drug abuse during pregnancy 03/19/2016  . Supervision of normal first pregnancy 01/23/2016    Past Surgical History:  Procedure Laterality Date  . DILATION AND EVACUATION  03/22/2012   Procedure: DILATATION AND EVACUATION;  Surgeon: Brock Bad, MD;  Location: [facility];  Service: Gynecology;  Laterality: N/A;    OB History    Gravida Para Term Preterm AB Living   2 0 0 0 1 0   SAB TAB Ectopic Multiple Live Births   1 0 0 0         Home Medications    Prior to Admission medications   Medication Sig Start Date End Date Taking? Authorizing Provider    cyclobenzaprine (FLEXERIL) 5 MG tablet Take 1 tablet (5 mg total) by mouth 3 (three) times daily as needed for muscle spasms. 04/16/16   Adam Phenix, MD  Doxylamine-Pyridoxine (DICLEGIS) 10-10 MG TBEC Take 1 tablet with breakfast and lunch.  Take 2 tablets at bedtime. Patient not taking: Reported on 06/18/2016 02/20/16   Roe Coombs, CNM  neomycin-polymyxin-hydrocortisone (CORTISPORIN) 3.5-10000-1 otic suspension Place 4 drops into the right ear 4 (four) times daily. 07/25/16 08/01/16  Barrett Henle, PA-C  prenatal vitamin w/FE, FA (PRENATAL 1 + 1) 27-1 MG TABS tablet Take 1 tablet by mouth daily at 12 noon. 06/04/16   Brock Bad, MD    Family History Family History  Problem Relation Age of Onset  . Cancer Paternal Grandfather   . Cancer Paternal Grandmother   . Emphysema Maternal Grandmother   . Hypertension Father   . Cancer Father     Social History Social History  Substance Use Topics  . Smoking status: Current Every Day Smoker    Packs/day: 0.25    Years: 5.00    Types: Cigarettes  . Smokeless tobacco: Never Used     Comment: pt is trying to use the electronic cigarette  . Alcohol use No     Comment: social     Allergies   Patient has no known allergies.   Review of Systems Review of Systems  HENT: Positive for ear discharge, ear pain (right), hearing loss (muffled)  and sore throat.   All other systems reviewed and are negative.    Physical Exam Updated Vital Signs BP 122/78 (BP Location: Right Arm)   Pulse 74   Temp 97.8 F (36.6 C) (Oral)   Resp 18   Ht  (1.702 m)   Wt 72.6 kg   LMP 11/15/2015 (Approximate)   SpO2 100%   BMI 25.06 kg/m   Physical Exam  Constitutional: She is oriented to person, place, and time. She appears well-developed and well-nourished.  HENT:  Head: Normocephalic and atraumatic.  Right Ear: Hearing normal. There is tenderness (mild TTP over tragus). No drainage or swelling. No mastoid tenderness. No  decreased hearing is noted.  Left Ear: Hearing, tympanic membrane and ear canal normal. No drainage, swelling or tenderness. No mastoid tenderness. No decreased hearing is noted.  Mouth/Throat: Uvula is midline, oropharynx is clear and moist and mucous membranes are normal. No oropharyngeal exudate, posterior oropharyngeal edema, posterior oropharyngeal erythema or tonsillar abscesses. No tonsillar exudate.  Right cerumen impaction with small amount of purulent malodorous drainage/cerumen present, unable to visualize TM.   Eyes: Conjunctivae and EOM are normal. Right eye exhibits no discharge. Left eye exhibits no discharge. No scleral icterus.  Neck: Normal range of motion. Neck supple.  Cardiovascular: Normal rate, regular rhythm, normal heart sounds and intact distal pulses.   Pulmonary/Chest: Effort normal and breath sounds normal. No respiratory distress. She has no wheezes. She has no rales. She exhibits no tenderness.  Abdominal: Soft. Bowel sounds are normal. She exhibits no mass. There is no tenderness. There is no rebound and no guarding.  Gravid abdomen,  nontender  Musculoskeletal: She exhibits no edema.  Lymphadenopathy:    She has no cervical adenopathy.  Neurological: She is alert and oriented to person, place, and time.  Skin: Skin is warm and dry.  Nursing note and vitals reviewed.    ED Treatments / Results  Labs (all labs ordered are listed, but only abnormal results are displayed) Labs Reviewed - No data to display  EKG  EKG Interpretation None       Radiology No results found.  Procedures Procedures (including critical care time)  Medications Ordered in ED Medications - No data to display   Initial Impression / Assessment and Plan / ED Course  I have reviewed the triage vital signs and the nursing notes.  Pertinent labs & imaging results that were available during my care of the patient were reviewed by me and considered in my medical decision making  (see chart for details).     Pt presenting with right ear pain and drainage. No canal occlusion, Pt afebrile in NAD. Exam non concerning for mastoiditis, cellulitis or malignant OE. Dc with cortisporin script.  Advised follow up in 3-4 days if no improvement with treatment or no complete resolution by 7 days. Pt is [redacted] weeks pregnant and was also evaluated by OB nurse and cleared in the ED. discussed symptomatic treatment and advised patient to follow up with her OB at her scheduled appointment next week. Discussed return precautions.  Final Clinical Impressions(s) / ED Diagnoses   Final diagnoses:  Other infective acute otitis externa of right ear    New Prescriptions Discharge Medication List as of 07/25/2016  9:04 AM    START taking these medications   Details  neomycin-polymyxin-hydrocortisone (CORTISPORIN) 3.5-10000-1 otic suspension Place 4 drops into the right ear 4 (four) times daily., Starting Thu 07/25/2016, Until Thu 08/01/2016, Print  Satira Sark [place], [place] 07/25/16 1610    Benjiman Core, MD 07/25/16 1600

## 2016-07-25 NOTE — Progress Notes (Signed)
Pt is a G2P0 at 34 3/[redacted] weeks gestation here with c/o an earache in her rt ear. Says she has been having a black/brown drainage from that ear. Has been hurting since 0300 this morning. Denies any problems with this pregnancy or any significant health problems. No vaginal bleeding or leaking of fluid. Says she gets her care at [facility].

## 2016-07-25 NOTE — Progress Notes (Signed)
Spoke with Dr. Macon Large. Pt is a G2P0 at 34 3/[redacted] weeks gestation who gets her care at [facility]. She is here with c/o pain in her rt ear. FHR tracing is a Programmer, systems. No vaginal bleeding or leaking of fluid. She is OB cleared. ED staff notified.

## 2016-08-01 ENCOUNTER — Encounter: Payer: Self-pay | Admitting: Obstetrics

## 2016-08-01 ENCOUNTER — Ambulatory Visit (INDEPENDENT_AMBULATORY_CARE_PROVIDER_SITE_OTHER): Payer: Medicaid Other | Admitting: Obstetrics

## 2016-08-01 VITALS — BP 134/78 | HR 78 | Wt 155.2 lb

## 2016-08-01 DIAGNOSIS — Z3483 Encounter for supervision of other normal pregnancy, third trimester: Secondary | ICD-10-CM | POA: Diagnosis not present

## 2016-08-01 DIAGNOSIS — Z3480 Encounter for supervision of other normal pregnancy, unspecified trimester: Secondary | ICD-10-CM

## 2016-08-01 DIAGNOSIS — Z3402 Encounter for supervision of normal first pregnancy, second trimester: Secondary | ICD-10-CM

## 2016-08-01 NOTE — Progress Notes (Signed)
Subjective:  Kathryn Medina is a 26 y.o. G2P0010 at 2916w3d being seen today for ongoing prenatal care.  She is currently monitored for the following issues for this low-risk pregnancy and has Supervision of normal first pregnancy; Drug abuse during pregnancy; and Back pain affecting pregnancy on her problem list.  Patient reports no complaints.  Contractions: Not present. Vag. Bleeding: None.  Movement: Present. Denies leaking of fluid.   The following portions of the patient's history were reviewed and updated as appropriate: allergies, current medications, past family history, past medical history, past social history, past surgical history and problem list. Problem list updated.  Objective:   Vitals:   08/01/16 1113  BP: 134/78  Pulse: 78  Weight: 155 lb 3.2 oz (70.4 kg)    Fetal Status: Fetal Heart Rate (bpm): 150   Movement: Present     General:  Alert, oriented and cooperative. Patient is in no acute distress.  Skin: Skin is warm and dry. No rash noted.   Cardiovascular: Normal heart rate noted  Respiratory: Normal respiratory effort, no problems with respiration noted  Abdomen: Soft, gravid, appropriate for gestational age. Pain/Pressure: Present     Pelvic:  Cervical exam deferred        Extremities: Normal range of motion.  Edema: None  Mental Status: Normal mood and affect. Normal behavior. Normal judgment and thought content.   Urinalysis:      Assessment and Plan:  Pregnancy: G2P0010 at 5516w3d  1. Encounter for supervision of normal first pregnancy in second trimester Rx: - Strep Gp B NAA  Preterm labor symptoms and general obstetric precautions including but not limited to vaginal bleeding, contractions, leaking of fluid and fetal movement were reviewed in detail with the patient. Please refer to After Visit Summary for other counseling recommendations.  Return in 1 week (on 08/08/2016) for ROB.   Brock Badharles A Harper, MDPatient ID: Kathryn Medina, female   DOB:  02/13/1991, 26 y.o.   MRN: [id number]

## 2016-08-01 NOTE — Progress Notes (Signed)
Patient reports good fetal movement with some pressure, denies contractions. 

## 2016-08-02 LAB — OB RESULTS CONSOLE GBS: GBS: NEGATIVE

## 2016-08-03 LAB — STREP GP B NAA: STREP GROUP B AG: NEGATIVE

## 2016-08-08 ENCOUNTER — Ambulatory Visit (INDEPENDENT_AMBULATORY_CARE_PROVIDER_SITE_OTHER): Payer: Medicaid Other | Admitting: Obstetrics

## 2016-08-08 VITALS — BP 113/63 | HR 85 | Wt 158.7 lb

## 2016-08-08 DIAGNOSIS — F1920 Other psychoactive substance dependence, uncomplicated: Secondary | ICD-10-CM

## 2016-08-08 DIAGNOSIS — O99323 Drug use complicating pregnancy, third trimester: Secondary | ICD-10-CM | POA: Diagnosis not present

## 2016-08-08 DIAGNOSIS — O99320 Drug use complicating pregnancy, unspecified trimester: Secondary | ICD-10-CM

## 2016-08-08 DIAGNOSIS — Z3480 Encounter for supervision of other normal pregnancy, unspecified trimester: Secondary | ICD-10-CM

## 2016-08-08 DIAGNOSIS — O0993 Supervision of high risk pregnancy, unspecified, third trimester: Secondary | ICD-10-CM | POA: Diagnosis not present

## 2016-08-08 NOTE — Progress Notes (Signed)
Subjective:  Kathryn Medina is a 26 y.o. G2P0010 at 4160w3d being seen today for ongoing prenatal care.  She is currently monitored for the following issues for this high-risk pregnancy and has Supervision of normal first pregnancy; Drug abuse during pregnancy; and Back pain affecting pregnancy on her problem list.  Patient reports no complaints.  Contractions: Not present. Vag. Bleeding: None.  Movement: Present. Denies leaking of fluid.   The following portions of the patient's history were reviewed and updated as appropriate: allergies, current medications, past family history, past medical history, past social history, past surgical history and problem list. Problem list updated.  Objective:   Vitals:   08/08/16 1535  BP: 113/63  Pulse: 85  Weight: 158 lb 11.2 oz (72 kg)    Fetal Status:     Movement: Present     General:  Alert, oriented and cooperative. Patient is in no acute distress.  Skin: Skin is warm and dry. No rash noted.   Cardiovascular: Normal heart rate noted  Respiratory: Normal respiratory effort, no problems with respiration noted  Abdomen: Soft, gravid, appropriate for gestational age. Pain/Pressure: Absent     Pelvic:  Cervical exam deferred        Extremities: Normal range of motion.  Edema: None  Mental Status: Normal mood and affect. Normal behavior. Normal judgment and thought content.   Urinalysis:      Assessment and Plan:  Pregnancy: G2P0010 at 8960w3d  1. Supervision of other normal pregnancy, antepartum  2. History of Substance Abuse  Term labor symptoms and general obstetric precautions including but not limited to vaginal bleeding, contractions, leaking of fluid and fetal movement were reviewed in detail with the patient. Please refer to After Visit Summary for other counseling recommendations.  No Follow-up on file.   Brock BadHarper, Charles A, MDPatient ID: Kathryn Medina, female   DOB: 07/20/1990, 26 y.o.   MRN: [id number]

## 2016-08-15 ENCOUNTER — Ambulatory Visit (INDEPENDENT_AMBULATORY_CARE_PROVIDER_SITE_OTHER): Payer: Medicaid Other | Admitting: Obstetrics

## 2016-08-15 VITALS — BP 136/74 | HR 89 | Wt 161.0 lb

## 2016-08-15 DIAGNOSIS — K029 Dental caries, unspecified: Secondary | ICD-10-CM | POA: Diagnosis not present

## 2016-08-15 DIAGNOSIS — Z331 Pregnant state, incidental: Secondary | ICD-10-CM | POA: Diagnosis not present

## 2016-08-15 MED ORDER — CLINDAMYCIN HCL 300 MG PO CAPS: 300.0000 mg | ORAL_CAPSULE | Freq: Three times a day (TID) | ORAL | 0 refills | Status: DC

## 2016-08-15 NOTE — Progress Notes (Signed)
Subjective:  Kathryn Medina is a 26 y.o. G2P0010 at 5358w3d being seen today for ongoing prenatal care.  She is currently monitored for the following issues for this high-risk pregnancy and has Supervision of normal first pregnancy; Drug abuse during pregnancy; and Back pain affecting pregnancy on her problem list.  Patient reports toothache.  Contractions: Not present. Vag. Bleeding: None.  Movement: Present. Denies leaking of fluid.   The following portions of the patient's history were reviewed and updated as appropriate: allergies, current medications, past family history, past medical history, past social history, past surgical history and problem list. Problem list updated.  Objective:   Vitals:   08/15/16 1341  BP: 136/74  Pulse: 89  Weight: 161 lb (73 kg)    Fetal Status:     Movement: Present     General:  Alert, oriented and cooperative. Patient is in no acute distress.  Skin: Skin is warm and dry. No rash noted.   Cardiovascular: Normal heart rate noted  Respiratory: Normal respiratory effort, no problems with respiration noted  Abdomen: Soft, gravid, appropriate for gestational age. Pain/Pressure: Absent     Pelvic:  Deferred  Extremities: Normal range of motion.  Edema: None  Mental Status: Normal mood and affect. Normal behavior. Normal judgment and thought content.   Urinalysis:      Assessment and Plan:  Pregnancy: G2P0010 at 4958w3d  1. Dental caries Rx: - clindamycin (CLEOCIN) 300 MG capsule; Take 1 capsule (300 mg total) by mouth 3 (three) times daily.  Dispense: 30 capsule; Refill: 0 - Ambulatory referral to Dentistry  Term labor symptoms and general obstetric precautions including but not limited to vaginal bleeding, contractions, leaking of fluid and fetal movement were reviewed in detail with the patient. Please refer to After Visit Summary for other counseling recommendations.  Return in about 1 week (around 08/22/2016) for ROB.  Clearance Coots.   Harper, Bing Neighborsharles A,  MD

## 2016-08-22 ENCOUNTER — Ambulatory Visit (INDEPENDENT_AMBULATORY_CARE_PROVIDER_SITE_OTHER): Payer: Medicaid Other | Admitting: Obstetrics

## 2016-08-22 ENCOUNTER — Encounter: Payer: Self-pay | Admitting: Obstetrics

## 2016-08-22 VITALS — BP 127/80 | HR 80 | Wt 160.6 lb

## 2016-08-22 DIAGNOSIS — Z3402 Encounter for supervision of normal first pregnancy, second trimester: Secondary | ICD-10-CM

## 2016-08-22 DIAGNOSIS — F1920 Other psychoactive substance dependence, uncomplicated: Secondary | ICD-10-CM

## 2016-08-22 DIAGNOSIS — O0992 Supervision of high risk pregnancy, unspecified, second trimester: Secondary | ICD-10-CM | POA: Diagnosis not present

## 2016-08-22 DIAGNOSIS — O99320 Drug use complicating pregnancy, unspecified trimester: Principal | ICD-10-CM

## 2016-08-22 DIAGNOSIS — O99322 Drug use complicating pregnancy, second trimester: Secondary | ICD-10-CM | POA: Diagnosis not present

## 2016-08-22 NOTE — Progress Notes (Signed)
Subjective:  Kathryn Medina is a 26 y.o. G2P0010 at 5870w3d being seen today for ongoing prenatal care.  She is currently monitored for the following issues for this high-risk pregnancy and has Supervision of normal first pregnancy; Drug abuse during pregnancy; and Back pain affecting pregnancy on her problem list.  Patient reports occasional lightheadedness.  Contractions: Not present. Vag. Bleeding: None.  Movement: Present. Denies leaking of fluid.   The following portions of the patient's history were reviewed and updated as appropriate: allergies, current medications, past family history, past medical history, past social history, past surgical history and problem list. Problem list updated.  Objective:   Vitals:   08/22/16 1403  BP: 127/80  Pulse: 80  Weight: 160 lb 9.6 oz (72.8 kg)    Fetal Status: Fetal Heart Rate (bpm): 150   Movement: Present     General:  Alert, oriented and cooperative. Patient is in no acute distress.  Skin: Skin is warm and dry. No rash noted.   Cardiovascular: Normal heart rate noted  Respiratory: Normal respiratory effort, no problems with respiration noted  Abdomen: Soft, gravid, appropriate for gestational age. Pain/Pressure: Absent     Pelvic:   Deferred  Extremities: Normal range of motion.  Edema: Trace  Mental Status: Normal mood and affect. Normal behavior. Normal judgment and thought content.   Urinalysis:      Assessment and Plan:  Pregnancy: G2P0010 at 6970w3d  1. Encounter for supervision of normal first pregnancy in second trimester   Term labor symptoms and general obstetric precautions including but not limited to vaginal bleeding, contractions, leaking of fluid and fetal movement were reviewed in detail with the patient. Please refer to After Visit Summary for other counseling recommendations.  Return in 1 week (on 08/29/2016) for ROB.   Brock BadHarper, Langley Ingalls A, MDPatient ID: Kathryn Medina, female   DOB: 09/29/1990, 26 y.o.   MRN:  [id number]

## 2016-08-22 NOTE — Progress Notes (Signed)
Patient reports good fetal movement, denies pain/contractions. Pt states that she has been lightheaded all day, denies blurred vision, reports slight headache.

## 2016-08-25 ENCOUNTER — Inpatient Hospital Stay (HOSPITAL_COMMUNITY)
Admission: AD | Admit: 2016-08-25 | Discharge: 2016-08-25 | Disposition: A | Discharge status: Home / Self Care | Payer: Medicaid Other | Source: Ambulatory Visit | Attending: Obstetrics & Gynecology | Admitting: Obstetrics & Gynecology

## 2016-08-25 ENCOUNTER — Encounter (HOSPITAL_COMMUNITY): Payer: Self-pay | Admitting: *Deleted

## 2016-08-25 DIAGNOSIS — Z3A38 38 weeks gestation of pregnancy: Secondary | ICD-10-CM | POA: Diagnosis not present

## 2016-08-25 DIAGNOSIS — O99333 Smoking (tobacco) complicating pregnancy, third trimester: Secondary | ICD-10-CM | POA: Insufficient documentation

## 2016-08-25 DIAGNOSIS — O26893 Other specified pregnancy related conditions, third trimester: Secondary | ICD-10-CM | POA: Insufficient documentation

## 2016-08-25 DIAGNOSIS — O479 False labor, unspecified: Secondary | ICD-10-CM

## 2016-08-25 DIAGNOSIS — O163 Unspecified maternal hypertension, third trimester: Secondary | ICD-10-CM | POA: Diagnosis present

## 2016-08-25 DIAGNOSIS — O471 False labor at or after 37 completed weeks of gestation: Secondary | ICD-10-CM | POA: Diagnosis not present

## 2016-08-25 DIAGNOSIS — E860 Dehydration: Secondary | ICD-10-CM | POA: Insufficient documentation

## 2016-08-25 DIAGNOSIS — O99323 Drug use complicating pregnancy, third trimester: Secondary | ICD-10-CM

## 2016-08-25 LAB — URINALYSIS, MICROSCOPIC (REFLEX)

## 2016-08-25 LAB — COMPREHENSIVE METABOLIC PANEL WITH GFR
ALT: 26 U/L (ref 14–54)
AST: 45 U/L — ABNORMAL HIGH (ref 15–41)
Albumin: 3.1 g/dL — ABNORMAL LOW (ref 3.5–5.0)
Alkaline Phosphatase: 233 U/L — ABNORMAL HIGH (ref 38–126)
Anion gap: 8 (ref 5–15)
BUN: 9 mg/dL (ref 6–20)
CO2: 22 mmol/L (ref 22–32)
Calcium: 8.9 mg/dL (ref 8.9–10.3)
Chloride: 104 mmol/L (ref 101–111)
Creatinine, Ser: 0.54 mg/dL (ref 0.44–1.00)
GFR calc Af Amer: >60 mL/min (ref >60)
GFR calc non Af Amer: >60 mL/min (ref >60)
Glucose, Bld: 92 mg/dL (ref 65–99)
Potassium: 5 mmol/L (ref 3.5–5.1)
Sodium: 134 mmol/L — ABNORMAL LOW (ref 135–145)
Total Bilirubin: 0.5 mg/dL (ref 0.3–1.2)
Total Protein: 7 g/dL (ref 6.5–8.1)

## 2016-08-25 LAB — PROTEIN / CREATININE RATIO, URINE
Creatinine, Urine: 174 mg/dL
PROTEIN CREATININE RATIO: 0.19 mg/mg{creat} — AB (ref 0.00–0.15)
TOTAL PROTEIN, URINE: 33 mg/dL

## 2016-08-25 LAB — CBC
HCT: 36.8 % (ref 36.0–46.0)
Hemoglobin: 12.7 g/dL (ref 12.0–15.0)
MCH: 32.4 pg (ref 26.0–34.0)
MCHC: 34.5 g/dL (ref 30.0–36.0)
MCV: 93.9 fL (ref 78.0–100.0)
Platelets: 229 K/uL (ref 150–400)
RBC: 3.92 MIL/uL (ref 3.87–5.11)
RDW: 13.8 % (ref 11.5–15.5)
WBC: 22.1 K/uL — ABNORMAL HIGH (ref 4.0–10.5)

## 2016-08-25 LAB — RAPID URINE DRUG SCREEN, HOSP PERFORMED
Amphetamines: NOT DETECTED
Barbiturates: NOT DETECTED
Benzodiazepines: NOT DETECTED
Cocaine: NOT DETECTED
Opiates: POSITIVE — AB
Tetrahydrocannabinol: POSITIVE — AB

## 2016-08-25 LAB — URINALYSIS, ROUTINE W REFLEX MICROSCOPIC
Glucose, UA: NEGATIVE mg/dL
Hgb urine dipstick: NEGATIVE
Ketones, ur: 5 mg/dL — AB
Leukocytes, UA: NEGATIVE
Nitrite: NEGATIVE
Protein, ur: NEGATIVE mg/dL
Specific Gravity, Urine: 1.025 (ref 1.005–1.030)
pH: 5 (ref 5.0–8.0)

## 2016-08-25 MED ORDER — LACTATED RINGERS IV BOLUS (SEPSIS)
1000.0000 mL | Freq: Once | INTRAVENOUS | Status: AC
  Administered 2016-08-25: 1000 mL via INTRAVENOUS

## 2016-08-25 NOTE — Discharge Instructions (Signed)

## 2016-08-25 NOTE — MAU Note (Signed)
Pt presents to MAU with complaints of contractions that started around 4am. Denies any VB or LOF

## 2016-08-25 NOTE — MAU Provider Note (Signed)
Chief Complaint:  Labor Eval   First Provider Initiated Contact with Patient 08/25/16 [phone]      HPI: Lanitra Battaglini is a 26 y.o. G2P0010 at [redacted]w[redacted]d who presents to maternity admissions reporting cramping/contractions starting at 4 am. She reports regular painful contractions in her abdomen and back, intermittent and unchanged in intensity since onset.    She has had only 2-3 sips of fluids in the last 24 hours.  The pt answers some questions but her mother answers most questions by CNM.  The pt appears drowsy but is easy to arouse and responds appropriately.  Pt mother reports that her daughter has been dehydrated at several recent OB visits and that she is nauseous and vomits when she drinks liquids. She has not tried any treatments for her symptoms.  There are no other associated symptoms.   She reports good fetal movement, denies LOF, vaginal bleeding, vaginal itching/burning, urinary symptoms, h/a, dizziness, or fever/chills.    HPI  Past Medical History: Past Medical History:  Diagnosis Date  . Asthma     Past obstetric history: OB History  Gravida Para Term Preterm AB Living  2 0 0 0 1 0  SAB TAB Ectopic Multiple Live Births  1 0 0 0      # Outcome Date GA Lbr Len/2nd Weight Sex Delivery Anes PTL Lv  2 Current           1 SAB 03/18/12 [redacted]w[redacted]d       ND     Birth Comments: System Generated. Please review and update pregnancy details.      Past Surgical History: Past Surgical History:  Procedure Laterality Date  . DILATION AND EVACUATION  03/22/2012   Procedure: DILATATION AND EVACUATION;  Surgeon: Brock Bad, MD;  Location: [facility];  Service: Gynecology;  Laterality: N/A;    Family History: Family History  Problem Relation Age of Onset  . Cancer Paternal Grandfather   . Cancer Paternal Grandmother   . Emphysema Maternal Grandmother   . Hypertension Father   . Cancer Father     Social History: Social History  Substance Use Topics  . Smoking status: Current  Every Day Smoker    Packs/day: 0.50    Years: 10.00    Types: Cigarettes  . Smokeless tobacco: Never Used     Comment: pt is trying to use the electronic cigarette  . Alcohol use No     Comment: social    Allergies:  Allergies  Allergen Reactions  . Other Hives    pork    Meds:  Prescriptions Prior to Admission  Medication Sig Dispense Refill Last Dose  . ferrous sulfate 325 (65 FE) MG EC tablet Take 325 mg by mouth daily with breakfast.    Past Week at Unknown time  . HYDROcodone-acetaminophen (NORCO) 10-325 MG tablet Take 1 tablet by mouth once. Tooth pain   Past Week at Unknown time  . prenatal vitamin w/FE, FA (PRENATAL 1 + 1) 27-1 MG TABS tablet Take 1 tablet by mouth daily at 12 noon. 30 each 6 08/24/2016 at Unknown time  . clindamycin (CLEOCIN) 300 MG capsule Take 1 capsule (300 mg total) by mouth 3 (three) times daily. (Patient not taking: Reported on 08/25/2016) 30 capsule 0 Not Taking at Unknown time    ROS:  Review of Systems  Constitutional: Negative for chills, fatigue and fever.  Eyes: Negative for visual disturbance.  Respiratory: Negative for shortness of breath.   Cardiovascular: Negative for chest pain.  Gastrointestinal: Positive for abdominal pain and nausea. Negative for vomiting.  Genitourinary: Negative for difficulty urinating, dysuria, flank pain, pelvic pain, vaginal bleeding, vaginal discharge and vaginal pain.  Musculoskeletal: Positive for back pain.  Neurological: Negative for dizziness and headaches.  Psychiatric/Behavioral: Negative.      I have reviewed patient's Past Medical Hx, Surgical Hx, Family Hx, Social Hx, medications and allergies.   Physical Exam   Patient Vitals for the past 24 hrs:  BP Temp Temp src Pulse Resp  08/25/16 1131 123/68 98.5 F (36.9 C) Oral 73 18  08/25/16 1100 113/68 - - 69 18  08/25/16 1030 112/67 - - 70 20  08/25/16 1015 113/64 - - 72 20  08/25/16 1000 (!) 120/58 - - 71 -  08/25/16 0945 112/62 - - 70 -   08/25/16 0932 128/85 - - 66 18  08/25/16 0917 115/60 - - 77 20  08/25/16 0840 139/74 - - 78 18  08/25/16 0747 (!) 156/71 97.8 F (36.6 C) Oral 77 18   Constitutional: Well-developed, well-nourished female in no acute distress.  Cardiovascular: normal rate Respiratory: normal effort GI: Abd soft, non-tender, gravid appropriate for gestational age.  MS: Extremities nontender, no edema, normal ROM Neurologic: Alert and oriented x 4.  GU: Neg CVAT.   Dilation: 1 Effacement (%): 50 Cervical Position: Posterior Station: -3 Exam by:: S.Faulk, RN  FHT:  Baseline 125 , moderate variability, accelerations present, no decelerations Contractions: q 1-2 mins, mild to palpation   Labs: Results for orders placed or performed during the hospital encounter of 08/25/16 (from the past 24 hour(s))  Urinalysis, Routine w reflex microscopic     Status: Abnormal   Collection Time: 08/25/16  7:50 AM  Result Value Ref Range   Color, Urine BROWN (A) YELLOW   APPearance TURBID (A) CLEAR   Specific Gravity, Urine  1.005 - 1.030    TEST NOT REPORTED DUE TO COLOR INTERFERENCE OF URINE PIGMENT   pH  5.0 - 8.0    TEST NOT REPORTED DUE TO COLOR INTERFERENCE OF URINE PIGMENT   Glucose, UA (A) NEGATIVE mg/dL    TEST NOT REPORTED DUE TO COLOR INTERFERENCE OF URINE PIGMENT   Hgb urine dipstick (A) NEGATIVE    TEST NOT REPORTED DUE TO COLOR INTERFERENCE OF URINE PIGMENT   Bilirubin Urine (A) NEGATIVE    TEST NOT REPORTED DUE TO COLOR INTERFERENCE OF URINE PIGMENT   Ketones, ur (A) NEGATIVE mg/dL    TEST NOT REPORTED DUE TO COLOR INTERFERENCE OF URINE PIGMENT   Protein, ur (A) NEGATIVE mg/dL    TEST NOT REPORTED DUE TO COLOR INTERFERENCE OF URINE PIGMENT   Nitrite (A) NEGATIVE    TEST NOT REPORTED DUE TO COLOR INTERFERENCE OF URINE PIGMENT   Leukocytes, UA (A) NEGATIVE    TEST NOT REPORTED DUE TO COLOR INTERFERENCE OF URINE PIGMENT  Urinalysis, Microscopic (reflex)     Status: Abnormal   Collection  Time: 08/25/16  7:50 AM  Result Value Ref Range   RBC / HPF 0-5 0 - 5 RBC/hpf   WBC, UA 6-30 0 - 5 WBC/hpf   Bacteria, UA MANY (A) NONE SEEN   Squamous Epithelial / LPF 0-5 (A) NONE SEEN   Ca Oxalate Crys, UA PRESENT   Rapid urine drug screen (hospital performed)     Status: Abnormal   Collection Time: 08/25/16  7:50 AM  Result Value Ref Range   Opiates POSITIVE (A) NONE DETECTED   Cocaine NONE DETECTED NONE DETECTED  Benzodiazepines NONE DETECTED NONE DETECTED   Amphetamines NONE DETECTED NONE DETECTED   Tetrahydrocannabinol POSITIVE (A) NONE DETECTED   Barbiturates NONE DETECTED NONE DETECTED  CBC     Status: Abnormal   Collection Time: 08/25/16  9:20 AM  Result Value Ref Range   WBC 22.1 (H) 4.0 - 10.5 K/uL   RBC 3.92 3.87 - 5.11 MIL/uL   Hemoglobin 12.7 12.0 - 15.0 g/dL   HCT 16.136.8 09.636.0 - 04.546.0 %   MCV 93.9 78.0 - 100.0 fL   MCH 32.4 26.0 - 34.0 pg   MCHC 34.5 30.0 - 36.0 g/dL   RDW 40.913.8 81.111.5 - 91.415.5 %   Platelets 229 150 - 400 K/uL  Comprehensive metabolic panel     Status: Abnormal   Collection Time: 08/25/16  9:20 AM  Result Value Ref Range   Sodium 134 (L) 135 - 145 mmol/L   Potassium 5.0 3.5 - 5.1 mmol/L   Chloride 104 101 - 111 mmol/L   CO2 22 22 - 32 mmol/L   Glucose, Bld 92 65 - 99 mg/dL   BUN 9 6 - 20 mg/dL   Creatinine, Ser 7.820.54 0.44 - 1.00 mg/dL   Calcium 8.9 8.9 - 95.610.3 mg/dL   Total Protein 7.0 6.5 - 8.1 g/dL   Albumin 3.1 (L) 3.5 - 5.0 g/dL   AST 45 (H) 15 - 41 U/L   ALT 26 14 - 54 U/L   Alkaline Phosphatase 233 (H) 38 - 126 U/L   Total Bilirubin 0.5 0.3 - 1.2 mg/dL   GFR calc non Af Amer >60 >60 mL/min   GFR calc Af Amer >60 >60 mL/min   Anion gap 8 5 - 15  Protein / creatinine ratio, urine     Status: Abnormal   Collection Time: 08/25/16  9:26 AM  Result Value Ref Range   Creatinine, Urine 174.00 mg/dL   Total Protein, Urine 33 mg/dL   Protein Creatinine Ratio 0.19 (H) 0.00 - 0.15 mg/mg[Cre]  Urinalysis, Routine w reflex microscopic     Status:  Abnormal   Collection Time: 08/25/16  9:26 AM  Result Value Ref Range   Color, Urine AMBER (A) YELLOW   APPearance CLEAR CLEAR   Specific Gravity, Urine 1.025 1.005 - 1.030   pH 5.0 5.0 - 8.0   Glucose, UA NEGATIVE NEGATIVE mg/dL   Hgb urine dipstick NEGATIVE NEGATIVE   Bilirubin Urine SMALL (A) NEGATIVE   Ketones, ur 5 (A) NEGATIVE mg/dL   Protein, ur NEGATIVE NEGATIVE mg/dL   Nitrite NEGATIVE NEGATIVE   Leukocytes, UA NEGATIVE NEGATIVE   O/Positive/-- (10/24 1317)  Imaging:  No results found.  MAU Course/MDM: UA ordered, urine so dark in color that UA unable to result most values BP elevated on intake, next BP borderline so preeclampsia labs ordered (CBC, CMP, P/C ratio and repeat UA ) UDS ordered IV fluids started, LR x 2000 ml given in MAU NST reviewed and reactive  No evidence of preeclampsia with normal blood pressures after intake BP and normal labs.   Cervix unchanged after 2+ hours in MAU so no signs of labor Pt contractions spaced out on monitor, pt reports some improvement in pain and less frequent contractions D/C home with labor precautions  Discussed positive opiates and THC with pt, pt reports she took a Vicodin 2 days ago when she thought she was taking ibuprofen for tooth pain and denies any THC use in 2 months. Discussed with pt that she and baby will likely be tested  on admission when she is in labor. Pt states understanding.  Pt stable at time of discharge.    Assessment: 1. Threatened labor at term   2. Mild dehydration   3. Drug use complicating pregnancy, third trimester   4. Hypertension affecting pregnancy, third trimester     Plan: Discharge home Labor precautions and fetal kick counts  Follow-up Information    [facility] Follow up.   Why:  On Thursday as scheduled, return to MAU with signs of labor or emergencies Contact information: [address] [place] [address] [phone]          Allergies as of 08/25/2016      Reactions   Other Hives   pork      Medication List    STOP taking these medications   clindamycin 300 MG capsule Commonly known as:  CLEOCIN   HYDROcodone-acetaminophen 10-325 MG tablet Commonly known as:  NORCO     TAKE these medications   ferrous sulfate 325 (65 FE) MG EC tablet Take 325 mg by mouth daily with breakfast.   prenatal vitamin w/FE, FA 27-1 MG Tabs tablet Take 1 tablet by mouth daily at 12 noon.       Sharen Counter Certified Nurse-Midwife 08/25/2016 11:34 AM

## 2016-08-25 NOTE — Progress Notes (Signed)
Discharge instructions reviewed with pt and significant other. All questions answered. Pt to keep appointment on Thursday and drink at least 2 liters of water a day. Escorted to lobby by RN

## 2016-08-27 ENCOUNTER — Inpatient Hospital Stay (HOSPITAL_COMMUNITY): Payer: Medicaid Other | Admitting: Anesthesiology

## 2016-08-27 ENCOUNTER — Encounter (HOSPITAL_COMMUNITY): Payer: Self-pay | Admitting: *Deleted

## 2016-08-27 ENCOUNTER — Telehealth: Payer: Self-pay

## 2016-08-27 ENCOUNTER — Inpatient Hospital Stay (HOSPITAL_COMMUNITY)
Admission: AD | Admit: 2016-08-27 | Discharge: 2016-08-31 | DRG: 774 | Disposition: A | Discharge status: Home / Self Care | Payer: Medicaid Other | Source: Ambulatory Visit | Attending: Obstetrics and Gynecology | Admitting: Obstetrics and Gynecology

## 2016-08-27 DIAGNOSIS — Z3A39 39 weeks gestation of pregnancy: Secondary | ICD-10-CM

## 2016-08-27 DIAGNOSIS — O368390 Maternal care for abnormalities of the fetal heart rate or rhythm, unspecified trimester, not applicable or unspecified: Secondary | ICD-10-CM | POA: Diagnosis present

## 2016-08-27 DIAGNOSIS — O99334 Smoking (tobacco) complicating childbirth: Secondary | ICD-10-CM | POA: Diagnosis present

## 2016-08-27 DIAGNOSIS — O4593 Premature separation of placenta, unspecified, third trimester: Secondary | ICD-10-CM | POA: Diagnosis present

## 2016-08-27 DIAGNOSIS — F17210 Nicotine dependence, cigarettes, uncomplicated: Secondary | ICD-10-CM | POA: Diagnosis present

## 2016-08-27 DIAGNOSIS — O99324 Drug use complicating childbirth: Secondary | ICD-10-CM | POA: Diagnosis present

## 2016-08-27 DIAGNOSIS — O368130 Decreased fetal movements, third trimester, not applicable or unspecified: Secondary | ICD-10-CM | POA: Diagnosis present

## 2016-08-27 DIAGNOSIS — O99323 Drug use complicating pregnancy, third trimester: Secondary | ICD-10-CM | POA: Diagnosis not present

## 2016-08-27 DIAGNOSIS — F1190 Opioid use, unspecified, uncomplicated: Secondary | ICD-10-CM | POA: Diagnosis present

## 2016-08-27 DIAGNOSIS — F1290 Cannabis use, unspecified, uncomplicated: Secondary | ICD-10-CM | POA: Diagnosis present

## 2016-08-27 DIAGNOSIS — Z3402 Encounter for supervision of normal first pregnancy, second trimester: Secondary | ICD-10-CM

## 2016-08-27 LAB — CBC WITH DIFFERENTIAL/PLATELET
BASOS ABS: 0 10*3/uL (ref 0.0–0.1)
BASOS PCT: 0 %
EOS PCT: 0 %
Eosinophils Absolute: 0 10*3/uL (ref 0.0–0.7)
HCT: 36.5 % (ref 36.0–46.0)
Hemoglobin: 12.4 g/dL (ref 12.0–15.0)
Lymphocytes Relative: 16 %
Lymphs Abs: 2.4 10*3/uL (ref 0.7–4.0)
MCH: 32.4 pg (ref 26.0–34.0)
MCHC: 34 g/dL (ref 30.0–36.0)
MCV: 95.3 fL (ref 78.0–100.0)
MONO ABS: 0.4 10*3/uL (ref 0.1–1.0)
Monocytes Relative: 3 %
Neutro Abs: 12.7 10*3/uL — ABNORMAL HIGH (ref 1.7–7.7)
Neutrophils Relative %: 81 %
PLATELETS: 224 10*3/uL (ref 150–400)
RBC: 3.83 MIL/uL — ABNORMAL LOW (ref 3.87–5.11)
RDW: 14 % (ref 11.5–15.5)
WBC: 15.6 10*3/uL — AB (ref 4.0–10.5)

## 2016-08-27 LAB — TYPE AND SCREEN
ABO/RH(D): O POS
Antibody Screen: NEGATIVE

## 2016-08-27 LAB — URINALYSIS, ROUTINE W REFLEX MICROSCOPIC
Bilirubin Urine: NEGATIVE
GLUCOSE, UA: NEGATIVE mg/dL
HGB URINE DIPSTICK: NEGATIVE
KETONES UR: NEGATIVE mg/dL
Nitrite: NEGATIVE
PROTEIN: NEGATIVE mg/dL
Specific Gravity, Urine: 1.002 — ABNORMAL LOW (ref 1.005–1.030)
pH: 7 (ref 5.0–8.0)

## 2016-08-27 MED ORDER — OXYTOCIN 40 UNITS IN LACTATED RINGERS INFUSION - SIMPLE MED
2.5000 [IU]/h | INTRAVENOUS | Status: DC
  Administered 2016-08-29: 2.5 [IU]/h via INTRAVENOUS
  Filled 2016-08-27: qty 1000

## 2016-08-27 MED ORDER — LACTATED RINGERS IV BOLUS (SEPSIS)
1000.0000 mL | Freq: Once | INTRAVENOUS | Status: AC
  Administered 2016-08-27: 1000 mL via INTRAVENOUS

## 2016-08-27 MED ORDER — LACTATED RINGERS IV SOLN
INTRAVENOUS | Status: DC
  Administered 2016-08-27 – 2016-08-28 (×4): via INTRAVENOUS

## 2016-08-27 MED ORDER — LACTATED RINGERS IV SOLN: 500.0000 mL | INTRAVENOUS | Status: DC | PRN

## 2016-08-27 MED ORDER — MISOPROSTOL 25 MCG QUARTER TABLET
25.0000 ug | ORAL_TABLET | ORAL | Status: DC | PRN
  Filled 2016-08-27: qty 1

## 2016-08-27 MED ORDER — OXYCODONE-ACETAMINOPHEN 5-325 MG PO TABS: 2.0000 | ORAL_TABLET | ORAL | Status: DC | PRN

## 2016-08-27 MED ORDER — OXYTOCIN BOLUS FROM INFUSION: 500.0000 mL | Freq: Once | INTRAVENOUS | Status: DC

## 2016-08-27 MED ORDER — LIDOCAINE HCL (PF) 1 % IJ SOLN
INTRAMUSCULAR | Status: DC | PRN
  Administered 2016-08-27 (×2): 5 mL via EPIDURAL

## 2016-08-27 MED ORDER — EPHEDRINE 5 MG/ML INJ: 10.0000 mg | INTRAVENOUS | Status: DC | PRN

## 2016-08-27 MED ORDER — OXYCODONE-ACETAMINOPHEN 5-325 MG PO TABS: 1.0000 | ORAL_TABLET | ORAL | Status: DC | PRN

## 2016-08-27 MED ORDER — LACTATED RINGERS IV SOLN
500.0000 mL | Freq: Once | INTRAVENOUS | Status: AC
  Administered 2016-08-27: 500 mL via INTRAVENOUS

## 2016-08-27 MED ORDER — TERBUTALINE SULFATE 1 MG/ML IJ SOLN: 0.2500 mg | Freq: Once | INTRAMUSCULAR | Status: DC | PRN

## 2016-08-27 MED ORDER — FENTANYL CITRATE (PF) 100 MCG/2ML IJ SOLN
50.0000 ug | INTRAMUSCULAR | Status: DC | PRN
  Filled 2016-08-27: qty 2

## 2016-08-27 MED ORDER — DIPHENHYDRAMINE HCL 50 MG/ML IJ SOLN: 12.5000 mg | INTRAMUSCULAR | Status: DC | PRN

## 2016-08-27 MED ORDER — LIDOCAINE HCL (PF) 1 % IJ SOLN
30.0000 mL | INTRAMUSCULAR | Status: AC | PRN
  Administered 2016-08-29: 30 mL via SUBCUTANEOUS
  Filled 2016-08-27: qty 30

## 2016-08-27 MED ORDER — SOD CITRATE-CITRIC ACID 500-334 MG/5ML PO SOLN: 30.0000 mL | ORAL | Status: DC | PRN

## 2016-08-27 MED ORDER — BUTORPHANOL TARTRATE 1 MG/ML IJ SOLN
2.0000 mg | Freq: Once | INTRAMUSCULAR | Status: AC
  Administered 2016-08-27: 2 mg via INTRAVENOUS
  Filled 2016-08-27: qty 2

## 2016-08-27 MED ORDER — ONDANSETRON HCL 4 MG/2ML IJ SOLN: 4.0000 mg | Freq: Four times a day (QID) | INTRAMUSCULAR | Status: DC | PRN

## 2016-08-27 MED ORDER — PHENYLEPHRINE 40 MCG/ML (10ML) SYRINGE FOR IV PUSH (FOR BLOOD PRESSURE SUPPORT)
80.0000 ug | PREFILLED_SYRINGE | INTRAVENOUS | Status: DC | PRN
  Filled 2016-08-27: qty 10

## 2016-08-27 MED ORDER — FENTANYL CITRATE (PF) 100 MCG/2ML IJ SOLN
100.0000 ug | INTRAMUSCULAR | Status: DC | PRN
  Administered 2016-08-27 (×2): 100 ug via INTRAVENOUS
  Filled 2016-08-27: qty 2

## 2016-08-27 MED ORDER — FENTANYL 2.5 MCG/ML BUPIVACAINE 1/10 % EPIDURAL INFUSION (WH - ANES)
14.0000 mL/h | INTRAMUSCULAR | Status: DC | PRN
  Administered 2016-08-27 – 2016-08-28 (×5): 14 mL/h via EPIDURAL
  Filled 2016-08-27 (×5): qty 100

## 2016-08-27 MED ORDER — PHENYLEPHRINE 40 MCG/ML (10ML) SYRINGE FOR IV PUSH (FOR BLOOD PRESSURE SUPPORT): 80.0000 ug | PREFILLED_SYRINGE | INTRAVENOUS | Status: DC | PRN

## 2016-08-27 MED ORDER — ACETAMINOPHEN 325 MG PO TABS: 650.0000 mg | ORAL_TABLET | ORAL | Status: DC | PRN

## 2016-08-27 NOTE — Telephone Encounter (Signed)
Pt called stating that she was discharged from hospital on 5/27. States that she has not been feeling very well, and that she has not really felt her baby move. Pt advised to go to MAU for evaluation

## 2016-08-27 NOTE — Progress Notes (Signed)
IOL for fetal tachycardia discovered at time of labor check. Contracting too frequently for cytotec placement. With speculum foley catheter placed. 160/mod/-a/-d, 1/thick/high. No other s/s infection.

## 2016-08-27 NOTE — Anesthesia Preprocedure Evaluation (Signed)
Anesthesia Evaluation  Patient identified by MRN, date of birth, ID band Patient awake    Reviewed: Allergy & Precautions, NPO status , Patient's Chart, lab work & pertinent test results  Airway Mallampati: II  TM Distance: >3 FB Neck ROM: Full    Dental no notable dental hx. (+) Dental Advisory Given   Pulmonary Current Smoker,    Pulmonary exam normal        Cardiovascular negative cardio ROS Normal cardiovascular exam     Neuro/Psych negative neurological ROS  negative psych ROS   GI/Hepatic negative GI ROS, Neg liver ROS,   Endo/Other  negative endocrine ROS  Renal/GU negative Renal ROS  negative genitourinary   Musculoskeletal negative musculoskeletal ROS (+)   Abdominal   Peds negative pediatric ROS (+)  Hematology negative hematology ROS (+)   Anesthesia Other Findings   Reproductive/Obstetrics (+) Pregnancy                             Anesthesia Physical Anesthesia Plan  ASA: II  Anesthesia Plan: Epidural   Post-op Pain Management:    Induction:   Airway Management Planned: Natural Airway  Additional Equipment:   Intra-op Plan:   Post-operative Plan:   Informed Consent: I have reviewed the patients History and Physical, chart, labs and discussed the procedure including the risks, benefits and alternatives for the proposed anesthesia with the patient or authorized representative who has indicated his/her understanding and acceptance.   Dental advisory given  Plan Discussed with: Anesthesiologist  Anesthesia Plan Comments:         Anesthesia Quick Evaluation

## 2016-08-27 NOTE — MAU Note (Signed)
+   lower abdominal pain and pressure that has been going on since Sunday. Pain comes and goes; worse with standing. Rating pain 5/10.  Denies Vaginal bleeding, LOF, or discharge.  States has not felt any fetal movement today; last felt yesterday morning. FHR 159

## 2016-08-27 NOTE — Progress Notes (Signed)
   Jinny Blossomshley Ann Ledo is a 26 y.o. G2P0010 at 7135w1d  admitted for NRFHT in MAU.   Subjective: Patient coping well with epidural in place.   Objective: Vitals:   08/27/16 1930 08/27/16 2000 08/27/16 2030 08/27/16 2100  BP: 128/79 128/73 128/77 128/80  Pulse: 72 69 72 71  Resp: 16 18 16 18   Temp: 97.8 F (36.6 C)     TempSrc: Oral     SpO2:      Weight:      Height:       No intake/output data recorded.  FHT:  FHR: 150 bpm, variability: moderate,  accelerations:  Abscent,  decelerations:  Absent UC:   regular, every 1-3 minutes SVE:   Dilation: 1 Effacement (%): 60 Exam by:: Foley, RN   Labs: Lab Results  Component Value Date   WBC 15.6 (H) 08/27/2016   HGB 12.4 08/27/2016   HCT 36.5 08/27/2016   MCV 95.3 08/27/2016   PLT 224 08/27/2016    Assessment / Plan: Induction of labor due to non-reassuring fetal testing,  progressing well on pitocin  Labor: early labor Fetal Wellbeing:  Category I Pain Control:  Epidural Anticipated MOD:  NSVD  Charlesetta GaribaldiKathryn Lorraine Raeshaun Simson CNM 08/27/2016, 9:22 PM

## 2016-08-27 NOTE — Anesthesia Procedure Notes (Signed)
Epidural Patient location during procedure: OB Start time: 08/27/2016 6:31 PM End time: 08/27/2016 6:44 PM  Staffing Anesthesiologist: Heather RobertsSINGER, Serafina Topham Performed: anesthesiologist   Preanesthetic Checklist Completed: patient identified, site marked, pre-op evaluation, timeout performed, IV checked, risks and benefits discussed and monitors and equipment checked  Epidural Patient position: sitting Prep: DuraPrep Patient monitoring: heart rate, cardiac monitor, continuous pulse ox and blood pressure Approach: midline Location: L2-L3 Injection technique: LOR saline  Needle:  Needle type: Tuohy  Needle gauge: 17 G Needle length: 9 cm Needle insertion depth: 5 cm Catheter size: 20 Guage Catheter at skin depth: 10 cm Test dose: negative and Other  Assessment Events: blood not aspirated, injection not painful, no injection resistance and negative IV test  Additional Notes Informed consent obtained prior to proceeding including risk of failure, 1% risk of PDPH, risk of minor discomfort and bruising.  Discussed rare but serious complications including epidural abscess, permanent nerve injury, epidural hematoma.  Discussed alternatives to epidural analgesia and patient desires to proceed.  Timeout performed pre-procedure verifying patient name, procedure, and platelet count.  Patient tolerated procedure well.

## 2016-08-27 NOTE — Anesthesia Pain Management Evaluation Note (Signed)
  CRNA Pain Management Visit Note  Patient: Kathryn Medina, 26 y.o., female  "Hello I am a [facility] of the anesthesia team at [facility]. We have an anesthesia team available at all times to provide care throughout the hospital, including epidural management and anesthesia for C-section. I don't know your plan for the delivery whether it a natural birth, water birth, IV sedation, nitrous supplementation, doula or epidural, but we want to meet your pain goals."   1.Was your pain managed to your expectations on prior hospitalizations?   Yes   2.What is your expectation for pain management during this hospitalization?     Epidural  3.How can we help you reach that goal? epidural   Record the patient's initial score and the patient's pain goal.   Pain: 6/10  Pain Goal: 2/10 [facility] wants you to be able to say your pain was always managed very well.  Salome ArntSterling, Ciana Simmon Marie 08/27/2016

## 2016-08-27 NOTE — MAU Provider Note (Signed)
History     CSN: [id number]  Arrival date and time: 08/27/16 1059   Chief Complaint  Patient presents with  . Decreased Fetal Movement   G2P0010 @39 .1 wks here with no FM. She reports last FM yesterday am. She reports no sleep last night, couldn't get comfortable and feeling stressed since she had +UDS (opiates and THC) when she was here 2 days ago. She has felt one "flutter" since EFM applied. Denies VB, LOF, and ctx. Having intermittent back pain and took Flexeril today.     OB History    Gravida Para Term Preterm AB Living   2 0 0 0 1 0   SAB TAB Ectopic Multiple Live Births   1 0 0 0        Past Medical History:  Diagnosis Date  . Asthma     Past Surgical History:  Procedure Laterality Date  . DILATION AND EVACUATION  03/22/2012   Procedure: DILATATION AND EVACUATION;  Surgeon: Brock Badharles A Harper, MD;  Location: [facility];  Service: Gynecology;  Laterality: N/A;    Family History  Problem Relation Age of Onset  . Cancer Paternal Grandfather   . Cancer Paternal Grandmother   . Emphysema Maternal Grandmother   . Hypertension Father   . Cancer Father     Social History  Substance Use Topics  . Smoking status: Current Every Day Smoker    Packs/day: 0.50    Years: 10.00    Types: Cigarettes  . Smokeless tobacco: Never Used     Comment: pt is trying to use the electronic cigarette  . Alcohol use No     Comment: social    Allergies:  Allergies  Allergen Reactions  . Other Hives    pork    Prescriptions Prior to Admission  Medication Sig Dispense Refill Last Dose  . clindamycin (CLEOCIN) 300 MG capsule   0 Past Week at Unknown time  . cyclobenzaprine (FLEXERIL) 10 MG tablet Take 10 mg by mouth 3 (three) times daily as needed for muscle spasms.   08/27/2016 at Unknown time  . ferrous sulfate 325 (65 FE) MG EC tablet Take 325 mg by mouth daily with breakfast.    Past Week at Unknown time  . prenatal vitamin w/FE, FA (PRENATAL 1 + 1) 27-1 MG TABS tablet Take 1  tablet by mouth daily at 12 noon. 30 each 6 08/27/2016 at Unknown time    Review of Systems  Gastrointestinal: Negative for abdominal pain.  Genitourinary: Negative for vaginal bleeding and vaginal discharge.   Physical Exam   Blood pressure 125/83, pulse 71, temperature 97.9 F (36.6 C), temperature source Oral, resp. rate 18, weight 74 kg (163 lb 1.9 oz), last menstrual period 11/15/2015, SpO2 100 %.  Physical Exam  Nursing note and vitals reviewed. Constitutional: She is oriented to person, place, and time. She appears well-developed and well-nourished. No distress.  HENT:  Head: Normocephalic and atraumatic.  Neck: Normal range of motion.  Cardiovascular: Normal rate.   Respiratory: Effort normal.  GI: Soft. She exhibits no distension. There is no tenderness.  gravid  Genitourinary:  Genitourinary Comments: SVE 1/50/-2, vtx  Musculoskeletal: Normal range of motion.  Neurological: She is alert and oriented to person, place, and time.  Skin: Skin is warm and dry.  Psychiatric: She has a normal mood and affect.   EFM: 165 bpm, mod variability, no accels, no decels Toco: irritability  Results for orders placed or performed during the hospital encounter of 08/27/16 (from the  past 24 hour(s))  Urinalysis, Routine w reflex microscopic     Status: Abnormal   Collection Time: 08/27/16 11:20 AM  Result Value Ref Range   Color, Urine YELLOW YELLOW   APPearance CLEAR CLEAR   Specific Gravity, Urine 1.002 (L) 1.005 - 1.030   pH 7.0 5.0 - 8.0   Glucose, UA NEGATIVE NEGATIVE mg/dL   Hgb urine dipstick NEGATIVE NEGATIVE   Bilirubin Urine NEGATIVE NEGATIVE   Ketones, ur NEGATIVE NEGATIVE mg/dL   Protein, ur NEGATIVE NEGATIVE mg/dL   Nitrite NEGATIVE NEGATIVE   Leukocytes, UA TRACE (A) NEGATIVE   RBC / HPF 0-5 0 - 5 RBC/hpf   WBC, UA 0-5 0 - 5 WBC/hpf   Bacteria, UA RARE (A) NONE SEEN   Squamous Epithelial / LPF 0-5 (A) NONE SEEN  CBC with Differential/Platelet     Status:  Abnormal   Collection Time: 08/27/16 12:20 PM  Result Value Ref Range   WBC 15.6 (H) 4.0 - 10.5 K/uL   RBC 3.83 (L) 3.87 - 5.11 MIL/uL   Hemoglobin 12.4 12.0 - 15.0 g/dL   HCT 16.1 09.6 - 04.5 %   MCV 95.3 78.0 - 100.0 fL   MCH 32.4 26.0 - 34.0 pg   MCHC 34.0 30.0 - 36.0 g/dL   RDW 40.9 81.1 - 91.4 %   Platelets 224 150 - 400 K/uL   Neutrophils Relative % 81 %   Neutro Abs 12.7 (H) 1.7 - 7.7 K/uL   Lymphocytes Relative 16 %   Lymphs Abs 2.4 0.7 - 4.0 K/uL   Monocytes Relative 3 %   Monocytes Absolute 0.4 0.1 - 1.0 K/uL   Eosinophils Relative 0 %   Eosinophils Absolute 0.0 0.0 - 0.7 K/uL   Basophils Relative 0 %   Basophils Absolute 0.0 0.0 - 0.1 K/uL  Type and screen [facility]      Status: None (Preliminary result)   Collection Time: 08/27/16 12:20 PM  Result Value Ref Range   ABO/RH(D) O POS    Antibody Screen PENDING    Sample Expiration 08/30/2016     MAU Course  Procedures IVF bolus  MDM Mild fetal tachycardia and non-reactive tracing despite IVF bolus. Discussed presentation, clinical findings, and plan with Wouk, MD.    Assessment and Plan  39 weeks Nonreactive NST  Admit to [facility] per labor team  Donette Larry, CNM 08/27/2016, 12:02 PM

## 2016-08-27 NOTE — H&P (Signed)
LABOR ADMISSION HISTORY AND PHYSICAL  Kathryn Medina is a 26 y.o. female G2P0010 with IUP at [redacted]w[redacted]d by LMP presenting for NRFHT. Patient initially presented to MAU for decreased fetal movement. In MAU, was noted to have fetal tachycardia. She was subsequently admitted.  She reports minimal FM. Denies contractions, No LOF, no VB, no blurry vision, headaches or peripheral edema, and RUQ pain.  She plans on breast feeding. She request Depo for birth control. Of note, patient with UDS positive for THC and opiates in MAU on 5/27. Patient endorses using marijuana twice weekly, but says last time she smoked was two months ago. Endorses taking Flexeril prescribed by her OB for muscle spasms, but denies taking any other pain medications, either prescribed to her or anyone else. She is aware that her UDS was positive for opioids and thinks this is a mistake. Denies use of any illicit drugs. Endorses smoking 1/2 pack of cigarettes per day. Denies alcohol use.   Dating: By LMP --->  Estimated Date of Delivery: 09/02/16  Sono:    @[redacted]w[redacted]d , CWD, normal anatomy, transverse presentation, 1682g, 72% EFW   Prenatal History/Complications:  Past Medical History: Past Medical History:  Diagnosis Date  . Asthma     Past Surgical History: Past Surgical History:  Procedure Laterality Date  . DILATION AND EVACUATION  03/22/2012   Procedure: DILATATION AND EVACUATION;  Surgeon: Brock Bad, MD;  Location: [facility];  Service: Gynecology;  Laterality: N/A;    Obstetrical History: OB History    Gravida Para Term Preterm AB Living   2 0 0 0 1 0   SAB TAB Ectopic Multiple Live Births   1 0 0 0        Social History: Social History   Social History  . Marital status: Single    Spouse name: N/A  . Number of children: N/A  . Years of education: N/A   Social History Main Topics  . Smoking status: Current Every Day Smoker    Packs/day: 0.50    Years: 10.00    Types: Cigarettes  . Smokeless tobacco:  Never Used     Comment: pt is trying to use the electronic cigarette  . Alcohol use No     Comment: social  . Drug use: No  . Sexual activity: Yes    Partners: Male    Birth control/ protection: None   Other Topics Concern  . None   Social History Narrative  . None    Family History: Family History  Problem Relation Age of Onset  . Cancer Paternal Grandfather   . Cancer Paternal Grandmother   . Emphysema Maternal Grandmother   . Hypertension Father   . Cancer Father     Allergies: Allergies  Allergen Reactions  . Other Hives    pork    Prescriptions Prior to Admission  Medication Sig Dispense Refill Last Dose  . clindamycin (CLEOCIN) 300 MG capsule   0 Past Week at Unknown time  . cyclobenzaprine (FLEXERIL) 10 MG tablet Take 10 mg by mouth 3 (three) times daily as needed for muscle spasms.   08/27/2016 at Unknown time  . ferrous sulfate 325 (65 FE) MG EC tablet Take 325 mg by mouth daily with breakfast.    Past Week at Unknown time  . prenatal vitamin w/FE, FA (PRENATAL 1 + 1) 27-1 MG TABS tablet Take 1 tablet by mouth daily at 12 noon. 30 each 6 08/27/2016 at Unknown time     Review of  Systems   All systems reviewed and negative except as stated in HPI  BP 125/83 (BP Location: Right Arm)   Pulse 71   Temp 97.9 F (36.6 C) (Oral)   Resp 18   Wt 163 lb 1.9 oz (74 kg)   LMP 11/15/2015 (Approximate)   SpO2 100%   BMI 25.55 kg/m  General appearance: alert, cooperative and no distress Lungs: clear to auscultation bilaterally Heart: regular rate and rhythm Abdomen: soft, non-tender; bowel sounds normal Extremities: no LE edema, no signs of DVT Presentation: cephalic Fetal monitoringBaseline: 160 bpm, Variability: Good {> 6 bpm), Accelerations: Reactive and Decelerations: Absent Uterine activity Irregular Dilation: 1 Effacement (%): Thick Exam by:: Bhambri,CNM   Prenatal labs: ABO, Rh: --/--/O POS (05/29 1220) Antibody: NEG (05/29 1220) Rubella: !Error!  Immune RPR: Non Reactive (03/20 1110)  HBsAg: Negative (10/24 1317)  HIV: Non Reactive (03/20 1110)  GBS: Negative (05/04 0000)  1 hr Glucola Passed Genetic screening  Normal Anatomy US Normal female  Prenatal Transfer Tool  Maternal Diabetes: No Genetic Screening: Normal Maternal Ultrasounds/Referrals: Normal Fetal Ultrasounds or other Referrals:  None Maternal Substance Abuse:  Yes:  Type: Smoker, Marijuana, Other: Opiates Significant Maternal Medications:  Meds include: Other: Flexeril Significant Maternal Lab Results: Lab values include: Other: Positive UDS (THC, opiates)  Results for orders placed or performed during the hospital encounter of 08/27/16 (from the past 24 hour(s))  Urinalysis, Routine w reflex microscopic   Collection Time: 08/27/16 11:20 AM  Result Value Ref Range   Color, Urine YELLOW YELLOW   APPearance CLEAR CLEAR   Specific Gravity, Urine 1.002 (L) 1.005 - 1.030   pH 7.0 5.0 - 8.0   Glucose, UA NEGATIVE NEGATIVE mg/dL   Hgb urine dipstick NEGATIVE NEGATIVE   Bilirubin Urine NEGATIVE NEGATIVE   Ketones, ur NEGATIVE NEGATIVE mg/dL   Protein, ur NEGATIVE NEGATIVE mg/dL   Nitrite NEGATIVE NEGATIVE   Leukocytes, UA TRACE (A) NEGATIVE   RBC / HPF 0-5 0 - 5 RBC/hpf   WBC, UA 0-5 0 - 5 WBC/hpf   Bacteria, UA RARE (A) NONE SEEN   Squamous Epithelial / LPF 0-5 (A) NONE SEEN  CBC with Differential/Platelet   Collection Time: 08/27/16 12:20 PM  Result Value Ref Range   WBC 15.6 (H) 4.0 - 10.5 K/uL   RBC 3.83 (L) 3.87 - 5.11 MIL/uL   Hemoglobin 12.4 12.0 - 15.0 g/dL   HCT 45.436.5 09.836.0 - 11.946.0 %   MCV 95.3 78.0 - 100.0 fL   MCH 32.4 26.0 - 34.0 pg   MCHC 34.0 30.0 - 36.0 g/dL   RDW 14.714.0 82.911.5 - 56.215.5 %   Platelets 224 150 - 400 K/uL   Neutrophils Relative % 81 %   Neutro Abs 12.7 (H) 1.7 - 7.7 K/uL   Lymphocytes Relative 16 %   Lymphs Abs 2.4 0.7 - 4.0 K/uL   Monocytes Relative 3 %   Monocytes Absolute 0.4 0.1 - 1.0 K/uL   Eosinophils Relative 0 %    Eosinophils Absolute 0.0 0.0 - 0.7 K/uL   Basophils Relative 0 %   Basophils Absolute 0.0 0.0 - 0.1 K/uL  Type and screen [facility]   Collection Time: 08/27/16 12:20 PM  Result Value Ref Range   ABO/RH(D) O POS    Antibody Screen NEG    Sample Expiration 08/30/2016     Patient Active Problem List   Diagnosis Date Noted  . Non-reassuring fetal heart tones complicating pregnancy, antepartum 08/27/2016  . Back  pain affecting pregnancy 04/16/2016  . Drug abuse during pregnancy 03/19/2016  . Supervision of normal first pregnancy 01/23/2016    Assessment: Kathryn Medina is a 26 y.o. G2P0010 at [redacted]w[redacted]d here for IOL for NRFHT.  #Labor:Begin induction with cytotec  #Pain: IV pain meds and epidural available at maternal request #FWB: Cat 2 #ID:  GBS neg #MOF: Breast #MOC:Depo #Circ:  N/A #Substance abuse during pregnancy: SW consult  Tarri Abernethy, MD, MPH PGY-2

## 2016-08-28 LAB — URINALYSIS, ROUTINE W REFLEX MICROSCOPIC
Bilirubin Urine: NEGATIVE
GLUCOSE, UA: NEGATIVE mg/dL
KETONES UR: NEGATIVE mg/dL
NITRITE: NEGATIVE
PH: 7 (ref 5.0–8.0)
PROTEIN: 30 mg/dL — AB
Specific Gravity, Urine: 1.005 (ref 1.005–1.030)

## 2016-08-28 LAB — RAPID URINE DRUG SCREEN, HOSP PERFORMED
AMPHETAMINES: NOT DETECTED
BARBITURATES: NOT DETECTED
Benzodiazepines: NOT DETECTED
Cocaine: NOT DETECTED
Opiates: NOT DETECTED
Tetrahydrocannabinol: NOT DETECTED

## 2016-08-28 LAB — RPR: RPR Ser Ql: NONREACTIVE

## 2016-08-28 MED ORDER — OXYTOCIN 40 UNITS IN LACTATED RINGERS INFUSION - SIMPLE MED
1.0000 m[IU]/min | INTRAVENOUS | Status: DC
  Administered 2016-08-28: 2 m[IU]/min via INTRAVENOUS

## 2016-08-28 MED ORDER — NICOTINE 7 MG/24HR TD PT24
7.0000 mg | MEDICATED_PATCH | Freq: Every day | TRANSDERMAL | Status: DC
  Administered 2016-08-28: 7 mg via TRANSDERMAL
  Filled 2016-08-28 (×2): qty 1

## 2016-08-28 NOTE — Progress Notes (Signed)
CSW acknowledges consult due to SA hx and will meet with patient after delivery to complete a clinical assessment. Please call CSW if patient requests to meet with CSW prior to delivery.  Blaine HamperAngel Boyd-Gilyard, MSW, LCSW Clinical Social Work [phone]

## 2016-08-28 NOTE — Progress Notes (Signed)
Labor Progress Note Kathryn Medina is a 26 y.o. G2P0010 at 10123w2d presented for IOL 2/2 NRFHT (fetal tachycardia).   S: Having increasingly painful contractions. Would like epidural re-bolus.   O:  BP 132/90   Pulse 80   Temp 98.6 F (37 C) (Oral)   Resp 16   Ht 5\' 8"  (1.727 m)   Wt 73.9 kg (163 lb)   LMP 11/15/2015 (Approximate)   SpO2 100%   BMI 24.78 kg/m  EFM: Cat I   CVE: Dilation: 6 Effacement (%): 90 Cervical Position: Middle Station: -1 Presentation: Vertex Exam by:: Kathryn Medina IUPC Placed   A&P: 26 y.o. G2P0010 4023w2d  #Labor: Transitioning to active phase. S/p AROM to thick meconium. IUPC placed. Continue pitocin, titrate per protocol and adequate MVUs.  #Pain: Epidural  #FWB: Cat I  #GBS negative #Polysubstance abuse: social work consulted, will see postpartum for evaluation.   Al CorpusMatthew R Kili Gracy, MD 4:12 PM

## 2016-08-28 NOTE — Progress Notes (Addendum)
Labor Progress Note Kathryn Medina is a 26 y.o. G2P0010 at 7511w2d presented for IOL 2/2 NRFHT (fetal tachycardia).   S: Comfortable s/p epidural. Resting.   O:  BP (!) 103/54   Pulse 71   Temp 98.9 F (37.2 C) (Oral)   Resp 16   Ht 5\' 8"  (1.727 m)   Wt 73.9 kg (163 lb)   LMP 11/15/2015 (Approximate)   SpO2 100%   BMI 24.78 kg/m  EFM: Cat I   CVE: Dilation: 4.5 Effacement (%): 70 Station: -1 Presentation: Vertex Exam by:: Foley,rn  AROM to thick, dark black meconium with particulates   A&P: 26 y.o. G2P0010 4011w2d  #Labor: Progressing well. Early labor. AROM to thick meconium. Continue pitocin, titrate per protocol.  #Pain: Epidural  #FWB: Cat I  #GBS negative #Polysubstance abuse: social work consulted, will see postpartum for evaluation.   Al CorpusMatthew R Jamaurion Slemmer, MD 11:16 AM

## 2016-08-28 NOTE — Progress Notes (Signed)
Subjective: Patient comfortable with epidural. Feeling pressure with contractions  Objective: Vitals:   08/28/16 2030 08/28/16 2100 08/28/16 2130 08/28/16 2200  BP: 132/71 123/71 119/74 127/71  Pulse: 89 88 85 90  Resp: 18 18 18 18   Temp:    98.7 F (37.1 C)  TempSrc:    Oral  SpO2:      Weight:      Height:       No intake/output data recorded.  FHT:  FHR: 150 bpm, variability: moderate,  accelerations:  Present,  decelerations:  Present variable UC:   regular, every 1-2 minutes SVE:   Dilation: 10 Effacement (%): 100 Station: +1 Exam by:: Catie Sullivan,RN Pitocin @ 6 mu/min  Labs: Lab Results  Component Value Date   WBC 15.6 (H) 08/27/2016   HGB 12.4 08/27/2016   HCT 36.5 08/27/2016   MCV 95.3 08/27/2016   PLT 224 08/27/2016    Assessment / Plan: IUP at term. IOL for NRFHT. GBS neg. Complete dilation.  Plan: labor down and plan to recheck in 1 hour or start pushing sooner if patient feeling pressure.   Cleone SlimCaroline Siarah Deleo SNM 08/28/2016, 10:22 PM

## 2016-08-28 NOTE — Progress Notes (Signed)
Patient ID: Kathryn BlossomAshley Ann Medina, female   DOB: 01/15/1991, 26 y.o.   MRN: [id number]  S: Patient seen & examined for progress of labor. Patient comfortable with epidural, but patient is getting irritable and frustrated for sitting in bed so long and not delivering fast enough.     O:  Vitals:   08/28/16 1730 08/28/16 1800 08/28/16 1830 08/28/16 1900  BP:  123/64    Pulse:  80    Resp: 16  16 18   Temp: 98.6 F (37 C)     TempSrc: Oral     SpO2:      Weight:      Height:        Dilation: 7 Effacement (%): 90 Cervical Position: Middle Station: -1 Presentation: Vertex Exam by:: Ardis HughsFoley,rn   FHT: 150bpm, mod var, no accels, no decels IUPC: Adequate >200MVU with q1-2 min    A/P: Will start 7mg  nicotine patch No indication currently to move toward cesarean, discussed with patient Will reassess in a few hours since just recently became adequate with contractions Continue expectant management Anticipate SVD

## 2016-08-29 ENCOUNTER — Encounter (HOSPITAL_COMMUNITY): Payer: Self-pay

## 2016-08-29 ENCOUNTER — Encounter: Payer: Medicaid Other | Admitting: Obstetrics

## 2016-08-29 DIAGNOSIS — Z3A39 39 weeks gestation of pregnancy: Secondary | ICD-10-CM

## 2016-08-29 MED ORDER — METHYLERGONOVINE MALEATE 0.2 MG PO TABS: 0.2000 mg | ORAL_TABLET | ORAL | Status: DC | PRN

## 2016-08-29 MED ORDER — TETANUS-DIPHTH-ACELL PERTUSSIS 5-2.5-18.5 LF-MCG/0.5 IM SUSP
0.5000 mL | Freq: Once | INTRAMUSCULAR | Status: AC
  Administered 2016-08-31: 0.5 mL via INTRAMUSCULAR
  Filled 2016-08-29: qty 0.5

## 2016-08-29 MED ORDER — DIPHENHYDRAMINE HCL 25 MG PO CAPS: 25.0000 mg | ORAL_CAPSULE | Freq: Four times a day (QID) | ORAL | Status: DC | PRN

## 2016-08-29 MED ORDER — ONDANSETRON HCL 4 MG/2ML IJ SOLN: 4.0000 mg | INTRAMUSCULAR | Status: DC | PRN

## 2016-08-29 MED ORDER — OXYCODONE HCL 5 MG PO TABS
10.0000 mg | ORAL_TABLET | ORAL | Status: DC | PRN
  Administered 2016-08-29 (×3): 10 mg via ORAL
  Filled 2016-08-29 (×3): qty 2

## 2016-08-29 MED ORDER — COCONUT OIL OIL: 1.0000 "application " | TOPICAL_OIL | Status: DC | PRN

## 2016-08-29 MED ORDER — DIBUCAINE 1 % RE OINT
1.0000 "application " | TOPICAL_OINTMENT | RECTAL | Status: DC | PRN
  Filled 2016-08-29 (×2): qty 28

## 2016-08-29 MED ORDER — ACETAMINOPHEN 325 MG PO TABS: 650.0000 mg | ORAL_TABLET | ORAL | Status: DC | PRN

## 2016-08-29 MED ORDER — WITCH HAZEL-GLYCERIN EX PADS: 1.0000 "application " | MEDICATED_PAD | CUTANEOUS | Status: DC | PRN

## 2016-08-29 MED ORDER — ONDANSETRON HCL 4 MG PO TABS: 4.0000 mg | ORAL_TABLET | ORAL | Status: DC | PRN

## 2016-08-29 MED ORDER — OXYCODONE HCL 5 MG PO TABS
5.0000 mg | ORAL_TABLET | ORAL | Status: DC | PRN
  Administered 2016-08-29 – 2016-08-31 (×6): 5 mg via ORAL
  Filled 2016-08-29 (×6): qty 1

## 2016-08-29 MED ORDER — SIMETHICONE 80 MG PO CHEW: 80.0000 mg | CHEWABLE_TABLET | ORAL | Status: DC | PRN

## 2016-08-29 MED ORDER — BENZOCAINE-MENTHOL 20-0.5 % EX AERO
1.0000 "application " | INHALATION_SPRAY | CUTANEOUS | Status: DC | PRN
  Filled 2016-08-29 (×2): qty 56

## 2016-08-29 MED ORDER — SENNOSIDES-DOCUSATE SODIUM 8.6-50 MG PO TABS
2.0000 | ORAL_TABLET | ORAL | Status: DC
  Administered 2016-08-29: 2 via ORAL
  Filled 2016-08-29 (×2): qty 2

## 2016-08-29 MED ORDER — PRENATAL MULTIVITAMIN CH
1.0000 | ORAL_TABLET | Freq: Every day | ORAL | Status: DC
  Administered 2016-08-29 – 2016-08-31 (×3): 1 via ORAL
  Filled 2016-08-29 (×4): qty 1

## 2016-08-29 MED ORDER — METHYLERGONOVINE MALEATE 0.2 MG/ML IJ SOLN: 0.2000 mg | INTRAMUSCULAR | Status: DC | PRN

## 2016-08-29 MED ORDER — ZOLPIDEM TARTRATE 5 MG PO TABS: 5.0000 mg | ORAL_TABLET | Freq: Every evening | ORAL | Status: DC | PRN

## 2016-08-29 MED ORDER — MEASLES, MUMPS & RUBELLA VAC ~~LOC~~ INJ
0.5000 mL | INJECTION | Freq: Once | SUBCUTANEOUS | Status: DC
  Filled 2016-08-29: qty 0.5

## 2016-08-29 MED ORDER — IBUPROFEN 600 MG PO TABS
600.0000 mg | ORAL_TABLET | Freq: Four times a day (QID) | ORAL | Status: DC
  Administered 2016-08-29 – 2016-08-31 (×10): 600 mg via ORAL
  Filled 2016-08-29 (×11): qty 1

## 2016-08-29 NOTE — Consult Note (Addendum)
[facility]  Delivery Note:  SVD    08/29/2016  1:30 AM  I was called to the delivery room at the request of the patient's obstetrician (Dr. Emelda FearFerguson) for thick meconium and non-reassuring fetal heart tones.  PRENATAL HX:  This is a 26 y/o G2P0010 at 2839 and 3/[redacted] weeks gestation who was admitted on 5/29 for fetal tachycardia in the setting of decreased fetal movement.  Labor was induced for the fetal tachycardia as it did not improve with IV fluids.  She has been afebrile, is GBS negative, and ROM was ~15  hours.  Her pregnancy has also been complicated by drug use, UDS positive for [facility] and opiates.  She is also taking flexeril as prescribed for muscle spasms.  NICU called to the delivery for thick meconium noted at rupture, fetal bradycardia, and vacuum extraction.    DELIVERY:  Infant was had poor tone at delivery and no spontaneous cry so cord clamping was not delayed.  The HR was > 100 with weak respiratory effort when placed on warmer and the infant responded well to standard warming, drying and stimulation.  Tone and reactivity gradually improved.  O2 saturations were in the mid 90s by 5 minutes and no supplemental oxygen was ever needed.  APGARs 3 and 9.  Exam notable for left parietal cephalohematoma but was otherwise within normal limits.  After 5 minutes, baby left with nurse to assist parents with skin-to-skin care.   _____________________ Electronically Signed By: Maryan CharLindsey Leonel Mccollum, MD Neonatologist

## 2016-08-29 NOTE — Lactation Note (Signed)
This note was copied from a baby's chart. Lactation Consultation Note  Patient Name: Kathryn Medina HQION'GToday's Date: 08/29/2016 Reason for consult: Initial assessment  Visited with Mom, baby 10 hrs old.  Baby has had one breastfeeding since birth.  Baby has been spitty, and not interested in feeding.  Baby being held by family member, Mom looks very tired.   History of THC use, and Drug screen on Mom + for opiods, cigarette smoker.  Baby depressed at birth, responding to drying, warmer, and stimulation.  Apgars 3/9 Mom stated she wants to formula feed.  Mom declines STS with baby. GMOB very talkative, and Mom very quiet.  LC expressed respect for her decision to formula feed, but encouraged STS as baby is spitty.  Explained the benefits to baby irregardless of feeding method.  Talked about engorgement treatment.   Encouraged Mom to call for assistance as needed.  Discussed this with Mom's RN.  Consult Status Consult Status: Complete    Judee ClaraSmith, Chanita Boden E 08/29/2016, 11:24 AM

## 2016-08-29 NOTE — Anesthesia Postprocedure Evaluation (Signed)
Anesthesia Post Note  Patient: Kathryn Medina  Procedure(s) Performed: * No procedures listed *  Patient location during evaluation: Mother Baby Anesthesia Type: Epidural Level of consciousness: awake Pain management: satisfactory to patient Vital Signs Assessment: post-procedure vital signs reviewed and stable Respiratory status: spontaneous breathing Cardiovascular status: stable Anesthetic complications: no        Last Vitals:  Vitals:   08/29/16 0420 08/29/16 0845  BP: 137/80 125/81  Pulse: 80 64  Resp: 20 18  Temp: 37 C 36.8 C    Last Pain:  Vitals:   08/29/16 0846  TempSrc:   PainSc: 7    Pain Goal: Patients Stated Pain Goal: 3 (08/29/16 0147)               Cephus ShellingBURGER,Markian Glockner

## 2016-08-30 NOTE — Clinical Social Work Maternal (Signed)
CLINICAL SOCIAL WORK MATERNAL/CHILD NOTE  Patient Details  Name: Kathryn Medina MRN: [id number] Date of Birth: [birth year]  Date:  08/30/2016  Clinical Social Worker Initiating Note:  Laurey Arrow Date/ Time Initiated:  08/30/16/1149     Child's Name:  Kathryn Medina   Legal Guardian:  Mother (FOB is Kathryn Medina 8/?/1983)   Need for Interpreter:  None   Date of Referral:  08/29/16     Reason for Referral:  Current Substance Use/Substance Use During Pregnancy  (hx of THC and Opiate use. )   Referral Source:  Central Nursery   Address:  [address] [place] [address]  Phone number:  [phone]   Household Members:  Self, Parents   Natural Supports (not living in the home):  Spouse/significant other, Friends, Immediate Family (FOB's family will also be a source of support. )   Professional Supports: Case Metallurgist (MOB has an Animal nutritionist. )   Employment: Unemployed   Type of Work:     Education:  9 to 11 years   Museum/gallery curator Resources:  Medicaid   Other Resources:  [employer], Physicist, medical    Cultural/Religious Considerations Which May Impact Care:  None Reported  Strengths:  Ability to meet basic needs , Engineer, materials , Home prepared for child    Risk Factors/Current Problems:  Substance Use    Cognitive State:  Alert , Able to Concentrate , Linear Thinking    Mood/Affect:  Calm , Interested , Comfortable , Relaxed    CSW Assessment: CSW met with MOB to complete an assessment for hx THC and opiate use.  MOB was receptive to meeting with CSW.  When CSW arrived, MOB was resting in bed and infant was asleep in basinet. During the assessment, MOB appeared comfortable and was attentive to infant's needs. CSW inquired about MOB's substance use hx, and MOB acknowledged the use of marijuana and opiates throughout pregnancy. MOB reported MOB's last use was about 3 months ago.  CSW informed MOB that MOB had a positive UDS on 08/25/16, however,  MOB continue to be consistent with reporting last use of any substance being 3 months ago. CSW informed MOB of the hospital's drug screen policy, and informed MOB of the 2 screenings for the infant. MOB appeared understanding and expressed feelings of guilt about using substance during pregnancy. CSW shared with MOB that the infant's UDS is negative and CSW will continue to monitor the infant's CDS. CSW made MOB aware that if the infant's CDS is positive without an explanation, CSW will make a report to [facility] CPS.  MOB did not have any questions regarding the hospital's policy. CSW offered MOB resources and referrals for SA, and MOB declined.   CSW educated MOB about PPD. CSW informed MOB of possible supports and interventions to decrease PPD.  CSW also encouraged MOB to seek medical attention if needed for increased signs and symptoms for PPD. CSW reviewed safe sleep, and SIDS. MOB was knowledgeable and asked appropriate questions.  MOB communicated that she has basinet for the baby, and feels prepared for the infant.  MOB did not have any further questions, concerns, or needs at this time. CSW thanked MOB for meeting with CSW and provided MOB with CSW contact information.  CSW Plan/Description:  Information/Referral to [employer] , [employer] , No Further Intervention Required/No Barriers to Discharge (CSW will monitor infant's CDS will make a report if warranted. )   Laurey Arrow, MSW, [place] [employer] [phone]  Bayden Gil D BOYD-GILYARD, LCSW 08/30/2016, 12:14 PM

## 2016-08-30 NOTE — Progress Notes (Signed)
Post Partum Day 1 VAVD Subjective: Pt without complaints this morning. Ambulating and voiding without problems. + BM. Tolerating diet. Pain controlled. Bottle feeding. Min bleeding  Objective: Blood pressure 123/78, pulse 69, temperature 98.2 F (36.8 C), temperature source Oral, resp. rate 20, height 5\' 8"  (1.727 m), weight 73.9 kg (163 lb), last menstrual period 11/15/2015, SpO2 100 %, unknown if currently breastfeeding.  Physical Exam:  Lungs clear Heart RRR Abd soft + BS U-2  Ext non tender  Recent Labs  08/27/16 1220  HGB 12.4  HCT 36.5    Assessment/Plan: Stable. Continue with routine postpartum care Plan d/c tomorrow   LOS: 3 days   Hermina StaggersMichael L Joni Norrod 08/30/2016, 10:12 AM  Patient ID: Kathryn Medina, female   DOB: 02/06/1991, 26 y.o.   MRN: [id number]

## 2016-08-31 MED ORDER — SENNOSIDES-DOCUSATE SODIUM 8.6-50 MG PO TABS: 1.0000 | ORAL_TABLET | Freq: Every evening | ORAL | 0 refills | Status: DC | PRN

## 2016-08-31 MED ORDER — IBUPROFEN 600 MG PO TABS: 600.0000 mg | ORAL_TABLET | Freq: Four times a day (QID) | ORAL | 0 refills | Status: DC

## 2016-08-31 NOTE — Lactation Note (Signed)
This note was copied from a baby's chart. Lactation Consultation Note  Patient Name: Kathryn Medina ZOXWR'UToday's Date: 08/31/2016 Baby at 57 hr of life and set for d/c today. Mom stated she has noticed breast changes and might bf when she gets home. She is worried because she is a smoker. Reviewed breast changes, nipple care, drying up milk, and bf resources if she were to stated pumping or latching baby. Mom will call as needed.     Feeding Feeding Type: Formula Nipple Type: Slow - flow    Rulon Eisenmengerlizabeth E Jermarion Poffenberger 08/31/2016, 10:57 AM

## 2016-08-31 NOTE — Discharge Instructions (Signed)

## 2016-08-31 NOTE — Discharge Summary (Signed)
OB Discharge Summary     Patient Name: Kathryn Medina DOB: 10/04/1990 MRN: [id number]  Date of admission: 08/27/2016 Delivering MD: Tilda BurrowFERGUSON, Kimberlyann Hollar V   Date of discharge: 08/31/2016  Admitting diagnosis: NOT FEELING BABY MOVE Intrauterine pregnancy: 543w3d     Secondary diagnosis:  Active Problems:   Non-reassuring fetal heart tones complicating pregnancy, antepartum  Additional problems:  Patient Active Problem List   Diagnosis Date Noted  . Non-reassuring fetal heart tones complicating pregnancy, antepartum 08/27/2016  . Back pain affecting pregnancy 04/16/2016  . Drug abuse during pregnancy 03/19/2016  . Supervision of normal first pregnancy 01/23/2016        Discharge diagnosis: Term Pregnancy Delivered                                                                                                Post partum procedures:None  Augmentation: Pitocin  Complications: None  Hospital course:  Induction of Labor With Vaginal Delivery   26 y.o. yo G2P1011 at 3743w3d was admitted to the hospital 08/27/2016 for induction of labor.  Indication for induction: non reassuring fetal heart tone.  Patient had an uncomplicated labor course as follows: Membrane Rupture Time/Date: 10:08 AM ,08/28/2016   Intrapartum Procedures: Episiotomy:                                           Lacerations:  2nd degree [3]  Patient had delivery of a Viable infant.  Information for the patient's newborn:  Arvilla MeresCrews, Girl Bernette [[id number]][[id number]]  Delivery Method: Vaginal, Vacuum (Extractor) (Filed from Delivery Summary)   08/29/2016  Details of delivery can be found in separate delivery note.    Delivery done by me, and SNM, in team fashion, on second contraction using the Kiwi vacuum extractor. Single pop-off on my first attempt, before good seal obtained. Patient able to push effectively with subsequent contraction. Repair of deep second degree laceration done in continuous 2-layer running fashion with 3-0  monocryl.Orlan Leavens. -jvferguson MD Patient had a routine postpartum course. Patient is discharged home 08/31/16.  Physical exam  Vitals:   08/29/16 1837 08/30/16 0530 08/30/16 1900 08/31/16 0550  BP: 123/76 123/78 129/76 132/78  Pulse: 81 69 67 78  Resp: 18 20 18 18   Temp: 98.3 F (36.8 C) 98.2 F (36.8 C) 98.7 F (37.1 C) 98.9 F (37.2 C)  TempSrc: Oral Oral Oral Oral  SpO2: 100%     Weight:      Height:       General: alert, cooperative and no distress Lochia: appropriate Uterine Fundus: firm Incision: N/A DVT Evaluation: No evidence of DVT seen on physical exam. Labs: Lab Results  Component Value Date   WBC 15.6 (H) 08/27/2016   HGB 12.4 08/27/2016   HCT 36.5 08/27/2016   MCV 95.3 08/27/2016   PLT 224 08/27/2016   CMP Latest Ref Rng & Units 08/25/2016  Glucose 65 - 99 mg/dL 92  BUN 6 - 20 mg/dL 9  Creatinine 6.210.44 - 3.081.00 mg/dL 6.570.54  Sodium 135 - 145 mmol/L 134(L)  Potassium 3.5 - 5.1 mmol/L 5.0  Chloride 101 - 111 mmol/L 104  CO2 22 - 32 mmol/L 22  Calcium 8.9 - 10.3 mg/dL 8.9  Total Protein 6.5 - 8.1 g/dL 7.0  Total Bilirubin 0.3 - 1.2 mg/dL 0.5  Alkaline Phos 38 - 126 U/L 233(H)  AST 15 - 41 U/L 45(H)  ALT 14 - 54 U/L 26    Discharge instruction: per After Visit Summary and "Baby and Me Booklet". Mother interviewed re: single + UDS opiates early in pregnancy: pt reports having used FOB's vicodin for "toothache" x 1 and that she self-reported to provider at time of initial + UDS, and has been tested negative since.  Pt aware that if baby's UDS is negative, (it is) that sequellae from CPS unlikely, but that future questions are to be expected and necessary. After visit meds:  Allergies as of 08/31/2016      Reactions   Other Hives   pork      Medication List    STOP taking these medications   clindamycin 300 MG capsule Commonly known as:  CLEOCIN   cyclobenzaprine 10 MG tablet Commonly known as:  FLEXERIL     TAKE these medications   ferrous sulfate 325 (65  FE) MG EC tablet Take 325 mg by mouth daily with breakfast.   ibuprofen 600 MG tablet Commonly known as:  ADVIL,MOTRIN Take 1 tablet (600 mg total) by mouth every 6 (six) hours.   prenatal vitamin w/FE, FA 27-1 MG Tabs tablet Take 1 tablet by mouth daily at 12 noon.   senna-docusate 8.6-50 MG tablet Commonly known as:  Senokot-S Take 1 tablet by mouth at bedtime as needed for mild constipation.       Diet: routine diet  Activity: Advance as tolerated. Pelvic rest for 6 weeks.   Outpatient follow up:6 weeks Follow up Appt:No future appointments. Follow up Visit:No Follow-up on file.  Postpartum contraception: Depo Provera  At follow up Newborn Data: Live born female  Birth Weight: 6 lb 4.7 oz (2855 g) APGAR: 3, 9  Baby Feeding: Breast Disposition:home with mother   08/31/2016 Howard Pouch, MD

## 2016-09-05 ENCOUNTER — Telehealth: Payer: Self-pay

## 2016-09-05 NOTE — Telephone Encounter (Signed)
Patient called stating she did not get pain RX upon discharge from the hospital. Patient states that she is uncomfortable Called back to patient - she has been constipated and she has finally had a bowels movement. Encouraged stool softener. Also she does have stitches- encouraged soaks. Will ask Dr Clearance Cootsharper to send Ibuprofen for her discomfort.Patient to make 6 week PP appointment.

## 2016-09-06 ENCOUNTER — Other Ambulatory Visit: Payer: Self-pay | Admitting: Obstetrics

## 2016-09-06 NOTE — Telephone Encounter (Signed)
Patient did get Ibuprofen Rx

## 2016-09-16 ENCOUNTER — Telehealth: Payer: Self-pay | Admitting: *Deleted

## 2016-09-16 NOTE — Telephone Encounter (Signed)
Pt called to office stating that over the past couple weeks since being home after delivery she has been waking up with numbness and tingling in her arms/hands.  Return call to pt. Pt states problem is new, since been home from hospital.  Pt states she did not have this problem during pregnancy. Pt advised could be from increase fluids given while in hospital, putting pressure on nerves.  Pt made aware message to be sent to provider for any other recommendations and/or referrals. Pt does have PP appt scheduled for October 03, 2016.    Please advise and send response to clinical pool.

## 2016-09-17 NOTE — Telephone Encounter (Signed)
Please call pt and make aware 

## 2016-09-17 NOTE — Telephone Encounter (Signed)
Symptoms are normal response to fluid retention.  Should resolve over time.  Will follow up at her next appt.

## 2016-09-18 ENCOUNTER — Telehealth: Payer: Self-pay

## 2016-09-18 NOTE — Telephone Encounter (Signed)
Contacted pt and advised that per provider, fluid retention should resolve on its on, and she should follow up at next appt on 10-03-16, pt agreed.

## 2016-10-03 ENCOUNTER — Ambulatory Visit: Payer: Medicaid Other | Admitting: Obstetrics & Gynecology

## 2016-10-03 ENCOUNTER — Ambulatory Visit: Payer: Medicaid Other | Admitting: Obstetrics

## 2017-01-08 ENCOUNTER — Ambulatory Visit (INDEPENDENT_AMBULATORY_CARE_PROVIDER_SITE_OTHER): Payer: Medicaid Other | Admitting: Obstetrics

## 2017-01-08 ENCOUNTER — Encounter: Payer: Self-pay | Admitting: Obstetrics

## 2017-01-08 VITALS — BP 110/60 | HR 63 | Ht 67.0 in | Wt 143.6 lb

## 2017-01-08 DIAGNOSIS — N944 Primary dysmenorrhea: Secondary | ICD-10-CM | POA: Diagnosis not present

## 2017-01-08 DIAGNOSIS — Z30013 Encounter for initial prescription of injectable contraceptive: Secondary | ICD-10-CM

## 2017-01-08 DIAGNOSIS — Z3202 Encounter for pregnancy test, result negative: Secondary | ICD-10-CM | POA: Diagnosis not present

## 2017-01-08 LAB — POCT URINE PREGNANCY: Preg Test, Ur: NEGATIVE

## 2017-01-08 MED ORDER — MEDROXYPROGESTERONE ACETATE 150 MG/ML IM SUSP: 150.0000 mg | INTRAMUSCULAR | 4 refills | Status: DC

## 2017-01-08 MED ORDER — IBUPROFEN 800 MG PO TABS: 800.0000 mg | ORAL_TABLET | Freq: Three times a day (TID) | ORAL | 5 refills | Status: DC | PRN

## 2017-01-08 NOTE — Progress Notes (Signed)
Presents for [employer]. She did not have a PP visit because "someone told her she did not need it", she had baby 6 months ago. Patient wants the DEPO for [facility], she had unprotected sex 1 week ago. Complains of painful periods 8/10.

## 2017-01-08 NOTE — Progress Notes (Signed)
Subjective:    Kathryn Medina is a 26 y.o. female who presents for contraception counseling. The patient has no complaints today. The patient is sexually active. Pertinent past medical history: current smoker.  The information documented in the HPI was reviewed and verified.  Menstrual History: OB History    Gravida Para Term Preterm AB Living   0 1 1   SAB TAB Ectopic Multiple Live Births   1 0 0 0 1       Patient's last menstrual period was 12/31/2016.   Patient Active Problem List   Diagnosis Date Noted  . Non-reassuring fetal heart tones complicating pregnancy, antepartum 08/27/2016  . Back pain affecting pregnancy 04/16/2016  . Drug abuse during pregnancy (HCC) 03/19/2016  . Supervision of normal first pregnancy 01/23/2016   Past Medical History:  Diagnosis Date  . Asthma    does not use inhaler    Past Surgical History:  Procedure Laterality Date  . DILATION AND EVACUATION  03/22/2012   Procedure: DILATATION AND EVACUATION;  Surgeon: Brock Bad, MD;  Location: [facility];  Service: Gynecology;  Laterality: N/A;     Current Outpatient Prescriptions:  .  ferrous sulfate 325 (65 FE) MG EC tablet, Take 325 mg by mouth daily with breakfast. , Disp: , Rfl:  .  ibuprofen (ADVIL,MOTRIN) 600 MG tablet, Take 1 tablet (600 mg total) by mouth every 6 (six) hours., Disp: 30 tablet, Rfl: 0 .  ibuprofen (ADVIL,MOTRIN) 800 MG tablet, Take 1 tablet (800 mg total) by mouth every 8 (eight) hours as needed., Disp: 30 tablet, Rfl: 5 .  medroxyPROGESTERone (DEPO-PROVERA) 150 MG/ML injection, Inject 1 mL (150 mg total) into the muscle every 3 (three) months., Disp: 1 mL, Rfl: 4 .  prenatal vitamin w/FE, FA (PRENATAL 1 + 1) 27-1 MG TABS tablet, Take 1 tablet by mouth daily at 12 noon., Disp: 30 each, Rfl: 6 .  senna-docusate (SENOKOT-S) 8.6-50 MG tablet, Take 1 tablet by mouth at bedtime as needed for mild constipation., Disp: 30 tablet, Rfl: 0 Allergies  Allergen Reactions  .  Other Hives    pork    Social History  Substance Use Topics  . Smoking status: Current Every Day Smoker    Packs/day: 0.50    Years: 10.00    Types: Cigarettes  . Smokeless tobacco: Never Used     Comment: pt is trying to use the electronic cigarette  . Alcohol use No     Comment: social    Family History  Problem Relation Age of Onset  . Cancer Paternal Grandfather   . Cancer Paternal Grandmother   . Emphysema Maternal Grandmother   . Hypertension Father   . Cancer Father        Review of Systems Constitutional: negative for weight loss Genitourinary:negative for abnormal menstrual periods and vaginal discharge   Objective:   BP 110/60   Pulse 63   Ht  (1.702 m)   Wt 143 lb 9.6 oz (65.1 kg)   LMP 12/31/2016   Breastfeeding? No   BMI 22.49 kg/m    General:   alert  Skin:   no rash or abnormalities  Lungs:   clear to auscultation bilaterally  Heart:   regular rate and rhythm, S1, S2 normal, no murmur, click, rub or gallop  Breasts:   normal without suspicious masses, skin or nipple changes or axillary nodes  Abdomen:  normal findings: no organomegaly, soft, non-tender and no hernia  Pelvis:  External  genitalia: normal general appearance Urinary system: urethral meatus normal and bladder without fullness, nontender Vaginal: normal without tenderness, induration or masses Cervix: normal appearance Adnexa: normal bimanual exam Uterus: anteverted and non-tender, normal size   Lab Review Urine pregnancy test Labs reviewed yes Radiologic studies reviewed no  50% of 10 min visit spent on counseling and coordination of care.    Assessment:    26 y.o., starting Depo-Provera injections, no contraindications.   Plan:    All questions answered. Contraception: Depo-Provera injections. Discussed healthy lifestyle modifications. Follow up in 1 week. Pregnancy test, result: negative.    Meds ordered this encounter  Medications  . medroxyPROGESTERone  (DEPO-PROVERA) 150 MG/ML injection    Sig: Inject 1 mL (150 mg total) into the muscle every 3 (three) months.    Dispense:  1 mL    Refill:  4  . ibuprofen (ADVIL,MOTRIN) 800 MG tablet    Sig: Take 1 tablet (800 mg total) by mouth every 8 (eight) hours as needed.    Dispense:  30 tablet    Refill:  5   Orders Placed This Encounter  Procedures  . POCT urine pregnancy   Need to obtain previous records Follow up as needed.

## 2017-01-14 ENCOUNTER — Ambulatory Visit (INDEPENDENT_AMBULATORY_CARE_PROVIDER_SITE_OTHER): Payer: Medicaid Other

## 2017-01-14 DIAGNOSIS — Z3202 Encounter for pregnancy test, result negative: Secondary | ICD-10-CM | POA: Diagnosis not present

## 2017-01-14 DIAGNOSIS — Z3042 Encounter for surveillance of injectable contraceptive: Secondary | ICD-10-CM | POA: Diagnosis not present

## 2017-01-14 LAB — POCT URINE PREGNANCY: Preg Test, Ur: NEGATIVE

## 2017-01-14 MED ORDER — MEDROXYPROGESTERONE ACETATE 150 MG/ML IM SUSP
150.0000 mg | Freq: Once | INTRAMUSCULAR | Status: AC
  Administered 2017-01-14: 150 mg via INTRAMUSCULAR

## 2017-01-14 NOTE — Progress Notes (Signed)
Nurse visit for initial pt supplied Depo given L upper outer quad w/o difficulty. Pt denies IC; UPT neg. Next Depo due Jan 1-15 pt agrees.   Administrations This Visit    medroxyPROGESTERone (DEPO-PROVERA) injection 150 mg    Admin Date 01/14/2017 Action Given Dose 150 mg Route Intramuscular Administered By Lewayne Bunting, CMA

## 2017-04-07 ENCOUNTER — Ambulatory Visit (INDEPENDENT_AMBULATORY_CARE_PROVIDER_SITE_OTHER): Payer: Medicaid Other

## 2017-04-07 DIAGNOSIS — Z3042 Encounter for surveillance of injectable contraceptive: Secondary | ICD-10-CM

## 2017-04-07 MED ORDER — MEDROXYPROGESTERONE ACETATE 150 MG/ML IM SUSP
150.0000 mg | Freq: Once | INTRAMUSCULAR | Status: AC
  Administered 2017-04-07: 150 mg via INTRAMUSCULAR

## 2017-04-07 NOTE — Progress Notes (Signed)
Agree with nursing staff's documentation of this patient's clinic encounter.  Kathryn Medina, CNM 04/07/2017 4:31 PM

## 2017-04-07 NOTE — Progress Notes (Signed)
Pt presents for nurse visit for Depo Injection. Rt upper outer quad w/no problems. Next Depo Due : 06/23/17- 07/07/2017 Pt supplied

## 2017-05-05 ENCOUNTER — Other Ambulatory Visit: Payer: Self-pay

## 2017-05-05 ENCOUNTER — Emergency Department (HOSPITAL_COMMUNITY): Payer: Medicaid Other

## 2017-05-05 ENCOUNTER — Encounter (HOSPITAL_COMMUNITY): Payer: Self-pay | Admitting: *Deleted

## 2017-05-05 ENCOUNTER — Emergency Department (HOSPITAL_COMMUNITY)
Admission: EM | Admit: 2017-05-05 | Discharge: 2017-05-05 | Disposition: A | Discharge status: Home / Self Care | Payer: Medicaid Other | Attending: Emergency Medicine | Admitting: Emergency Medicine

## 2017-05-05 DIAGNOSIS — R110 Nausea: Secondary | ICD-10-CM | POA: Diagnosis not present

## 2017-05-05 DIAGNOSIS — R531 Weakness: Secondary | ICD-10-CM | POA: Diagnosis not present

## 2017-05-05 DIAGNOSIS — Z79899 Other long term (current) drug therapy: Secondary | ICD-10-CM | POA: Insufficient documentation

## 2017-05-05 DIAGNOSIS — R0789 Other chest pain: Secondary | ICD-10-CM | POA: Insufficient documentation

## 2017-05-05 DIAGNOSIS — R1013 Epigastric pain: Secondary | ICD-10-CM | POA: Diagnosis not present

## 2017-05-05 DIAGNOSIS — J45909 Unspecified asthma, uncomplicated: Secondary | ICD-10-CM | POA: Diagnosis not present

## 2017-05-05 DIAGNOSIS — R0602 Shortness of breath: Secondary | ICD-10-CM | POA: Insufficient documentation

## 2017-05-05 DIAGNOSIS — F17210 Nicotine dependence, cigarettes, uncomplicated: Secondary | ICD-10-CM | POA: Diagnosis not present

## 2017-05-05 DIAGNOSIS — R1114 Bilious vomiting: Secondary | ICD-10-CM | POA: Diagnosis not present

## 2017-05-05 DIAGNOSIS — R197 Diarrhea, unspecified: Secondary | ICD-10-CM | POA: Insufficient documentation

## 2017-05-05 DIAGNOSIS — M545 Low back pain: Secondary | ICD-10-CM | POA: Diagnosis not present

## 2017-05-05 LAB — I-STAT BETA HCG BLOOD, ED (MC, WL, AP ONLY): I-stat hCG, quantitative: <5 m[IU]/mL (ref <5)

## 2017-05-05 LAB — BASIC METABOLIC PANEL
Anion gap: 14 (ref 5–15)
BUN: 6 mg/dL (ref 6–20)
CALCIUM: 9.8 mg/dL (ref 8.9–10.3)
CO2: 18 mmol/L — AB (ref 22–32)
Chloride: 108 mmol/L (ref 101–111)
Creatinine, Ser: 0.57 mg/dL (ref 0.44–1.00)
GFR calc Af Amer: >60 mL/min (ref >60)
GLUCOSE: 132 mg/dL — AB (ref 65–99)
Potassium: 4 mmol/L (ref 3.5–5.1)
Sodium: 140 mmol/L (ref 135–145)

## 2017-05-05 LAB — CBC
HCT: 46.9 % — ABNORMAL HIGH (ref 36.0–46.0)
Hemoglobin: 16.3 g/dL — ABNORMAL HIGH (ref 12.0–15.0)
MCH: 32.7 pg (ref 26.0–34.0)
MCHC: 34.8 g/dL (ref 30.0–36.0)
MCV: 94.2 fL (ref 78.0–100.0)
Platelets: 221 10*3/uL (ref 150–400)
RBC: 4.98 MIL/uL (ref 3.87–5.11)
RDW: 13 % (ref 11.5–15.5)
WBC: 8.8 10*3/uL (ref 4.0–10.5)

## 2017-05-05 LAB — I-STAT CG4 LACTIC ACID, ED: Lactic Acid, Venous: 1.86 mmol/L (ref 0.5–1.9)

## 2017-05-05 LAB — I-STAT TROPONIN, ED: TROPONIN I, POC: 0 ng/mL (ref 0.00–0.08)

## 2017-05-05 MED ORDER — ONDANSETRON 4 MG PO TBDP: 4.0000 mg | ORAL_TABLET | Freq: Three times a day (TID) | ORAL | 0 refills | Status: DC | PRN

## 2017-05-05 MED ORDER — GI COCKTAIL ~~LOC~~
30.0000 mL | Freq: Once | ORAL | Status: AC
  Administered 2017-05-05: 30 mL via ORAL
  Filled 2017-05-05: qty 30

## 2017-05-05 MED ORDER — DICYCLOMINE HCL 10 MG/ML IM SOLN
20.0000 mg | Freq: Once | INTRAMUSCULAR | Status: AC
  Administered 2017-05-05: 20 mg via INTRAMUSCULAR
  Filled 2017-05-05: qty 2

## 2017-05-05 MED ORDER — SODIUM CHLORIDE 0.9 % IV BOLUS (SEPSIS)
1000.0000 mL | Freq: Once | INTRAVENOUS | Status: AC
  Administered 2017-05-05: 1000 mL via INTRAVENOUS

## 2017-05-05 MED ORDER — FAMOTIDINE IN NACL 20-0.9 MG/50ML-% IV SOLN
20.0000 mg | Freq: Once | INTRAVENOUS | Status: AC
  Administered 2017-05-05: 20 mg via INTRAVENOUS
  Filled 2017-05-05: qty 50

## 2017-05-05 NOTE — ED Provider Notes (Signed)
[facility] Provider Note   CSN: [id number] Arrival date & time: 05/05/17  1134     History   Chief Complaint Chief Complaint  Patient presents with  . Chest Pain  . Shortness of Breath  . Emesis    HPI Kathryn Medina is a 27 y.o. female.  HPI Patient presents with nausea, vomiting, diarrhea, weakness. Onset was this morning, soon after awakening. Patient was in her usual state of health when she went to bed yesterday. There is medication change, diet change, activity change. No sick contacts. She notes that since awakening, she has felt persistent nausea, generalized discomfort, but more the pain she is feels uncomfortable.  When she has had multiple episodes of vomiting and diarrhea. No dysuria. There is some pain in the epigastrium, and some in the lower back, described as sore. No ability to tolerate oral intake. Patient smokes, was counseled on the need for smoking cessation, particularly given that she has a infant child at home.  Past Medical History:  Diagnosis Date  . Asthma    does not use inhaler    Patient Active Problem List   Diagnosis Date Noted  . Non-reassuring fetal heart tones complicating pregnancy, antepartum 08/27/2016  . Back pain affecting pregnancy 04/16/2016  . Drug abuse during pregnancy (HCC) 03/19/2016  . Supervision of normal first pregnancy 01/23/2016    Past Surgical History:  Procedure Laterality Date  . DILATION AND EVACUATION  03/22/2012   Procedure: DILATATION AND EVACUATION;  Surgeon: Brock Badharles A Harper, MD;  Location: [facility];  Service: Gynecology;  Laterality: N/A;    OB History    Gravida Para Term Preterm AB Living   2 1 1  0 1 1   SAB TAB Ectopic Multiple Live Births   1 0 0 0 1       Home Medications    Prior to Admission medications   Medication Sig Start Date End Date Taking? Authorizing Provider  ferrous sulfate 325 (65 FE) MG EC tablet Take 325 mg by mouth daily with  breakfast.     [provider]  ibuprofen (ADVIL,MOTRIN) 600 MG tablet Take 1 tablet (600 mg total) by mouth every 6 (six) hours. Patient not taking: Reported on 01/14/2017 08/31/16   Howard PouchFeng, Lauren, MD  ibuprofen (ADVIL,MOTRIN) 800 MG tablet Take 1 tablet (800 mg total) by mouth every 8 (eight) hours as needed. Patient not taking: Reported on 01/14/2017 01/08/17   Brock BadHarper, Charles A, MD  medroxyPROGESTERone (DEPO-PROVERA) 150 MG/ML injection Inject 1 mL (150 mg total) into the muscle every 3 (three) months. 01/08/17   Brock BadHarper, Charles A, MD  prenatal vitamin w/FE, FA (PRENATAL 1 + 1) 27-1 MG TABS tablet Take 1 tablet by mouth daily at 12 noon. 06/04/16   Brock BadHarper, Charles A, MD  senna-docusate (SENOKOT-S) 8.6-50 MG tablet Take 1 tablet by mouth at bedtime as needed for mild constipation. Patient not taking: Reported on 01/14/2017 08/31/16   Howard PouchFeng, Lauren, MD    Family History Family History  Problem Relation Age of Onset  . Cancer Paternal Grandfather   . Cancer Paternal Grandmother   . Emphysema Maternal Grandmother   . Hypertension Father   . Cancer Father     Social History Social History   Tobacco Use  . Smoking status: Current Every Day Smoker    Packs/day: 0.50    Years: 10.00    Pack years: 5.00    Types: Cigarettes  . Smokeless tobacco: Never Used  .  Tobacco comment: pt is trying to use the electronic cigarette  Substance Use Topics  . Alcohol use: No    Comment: social  . Drug use: Yes    Types: Oxycodone, Marijuana     Allergies   Other   Review of Systems Review of Systems  Constitutional:       Per HPI, otherwise negative  HENT:       Per HPI, otherwise negative  Respiratory:       Per HPI, otherwise negative  Cardiovascular:       Per HPI, otherwise negative  Gastrointestinal: Positive for abdominal pain, diarrhea, nausea and vomiting.  Endocrine:       Negative aside from HPI  Genitourinary:       Neg aside from HPI   Musculoskeletal:       Per  HPI, otherwise negative  Skin: Negative.   Neurological: Negative for syncope.     Physical Exam Updated Vital Signs BP 103/62   Pulse (!) 50   Temp 99.2 F (37.3 C) (Axillary)   Resp (!) 21   SpO2 95%   Physical Exam  Constitutional: She is oriented to person, place, and time. She appears well-developed and well-nourished. No distress.  HENT:  Head: Normocephalic and atraumatic.  Eyes: Conjunctivae and EOM are normal.  Cardiovascular: Normal rate and regular rhythm.  Pulmonary/Chest: Effort normal and breath sounds normal. No stridor. No respiratory distress.  Abdominal: She exhibits no distension. There is tenderness in the epigastric area.  Musculoskeletal: She exhibits no edema.  Neurological: She is alert and oriented to person, place, and time. No cranial nerve deficit.  Skin: Skin is warm and dry.  Psychiatric: She has a normal mood and affect.  Nursing note and vitals reviewed.    ED Treatments / Results  Labs (all labs ordered are listed, but only abnormal results are displayed) Labs Reviewed  BASIC METABOLIC PANEL - Abnormal; Notable for the following components:      Result Value   CO2 18 (*)    Glucose, Bld 132 (*)    All other components within normal limits  CBC - Abnormal; Notable for the following components:   Hemoglobin 16.3 (*)    HCT 46.9 (*)    All other components within normal limits  I-STAT TROPONIN, ED  I-STAT BETA HCG BLOOD, ED ([facility], [facility], [facility] ONLY)  I-STAT CG4 LACTIC ACID, ED  I-STAT CG4 LACTIC ACID, ED    EKG  EKG Interpretation  Date/Time:  Monday May 05 2017 11:49:37 EST Ventricular Rate:  52 PR Interval:  130 QRS Duration: 84 QT Interval:  432 QTC Calculation: 401 R Axis:   96 Text Interpretation:  Sinus bradycardia Rightward axis T wave abnormality Artifact Abnormal ekg Confirmed by Gerhard Munch [phone]) on 05/05/2017 7:19:23 PM       Radiology Dg Chest 2 View  Result Date: 05/05/2017 CLINICAL DATA:  Onset of nausea,  vomiting, chest pain and sensation of heart racing this morning on waking up. EXAM: CHEST  2 VIEW COMPARISON:  PA and lateral chest 02/17/2013. FINDINGS: The lungs are clear. Heart size is normal. No pneumothorax or pleural fluid. Scoliosis noted. IMPRESSION: No acute disease. Electronically Signed   By: Drusilla Kanner M.D.   On: 05/05/2017 12:40    Procedures Procedures (including critical care time)  Medications Ordered in ED Medications  sodium chloride 0.9 % bolus 1,000 mL (0 mLs Intravenous Stopped 05/05/17 1911)  gi cocktail (Maalox,Lidocaine,Donnatal) (30 mLs Oral Given 05/05/17 1744)  famotidine (  PEPCID) IVPB 20 mg premix (0 mg Intravenous Stopped 05/05/17 1819)  dicyclomine (BENTYL) injection 20 mg (20 mg Intramuscular Given 05/05/17 1915)     Initial Impression / Assessment and Plan / ED Course  I have reviewed the triage vital signs and the nursing notes.  Pertinent labs & imaging results that were available during my care of the patient were reviewed by me and considered in my medical decision making (see chart for details).     7:38 PM On repeat exam patient is awake and alert, in no distress. Vital signs unremarkable heart rate in the 50s. We discussed all findings including reassuring labs, x-ray. X-ray reassuring. After discussion about recent events, lifestyle changes, they note that the patient did have a shellfish the night prior to the onset of symptoms, and did have GI reaction at that evening, which is new for her.  We discussed the possibility of food allergies contributing to her presentation tonight, and she was advised to use caution when eating seafood/shellfish . With otherwise reassuring findings, unremarkable vital signs, no evidence for distress, little suspicion for occult acute abdominal processes, no evidence for ongoing ACS, the patient was discharged in stable condition.  Final Clinical Impressions(s) / ED Diagnoses  Nausea and vomiting Atypical chest  pain   Gerhard Munch, MD 05/05/17 1940

## 2017-05-05 NOTE — ED Triage Notes (Signed)
Pt states she started feeling bad this am with n/v, chest pains, diaphoresis and palpitations. Pt feels like heart is racing. Pt feels lightheaded when she stands up.

## 2017-05-05 NOTE — ED Notes (Signed)
Pt stable, ambulatory, and verbalizes understanding of d/c instructions.  

## 2017-05-05 NOTE — Discharge Instructions (Signed)
As discussed, your evaluation today has been largely reassuring.  But, it is important that you monitor your condition carefully, and do not hesitate to return to the ED if you develop new, or concerning changes in your condition. ? ?Otherwise, please follow-up with your physician for appropriate ongoing care. ? ?

## 2017-06-30 ENCOUNTER — Ambulatory Visit (INDEPENDENT_AMBULATORY_CARE_PROVIDER_SITE_OTHER): Payer: Medicaid Other

## 2017-06-30 DIAGNOSIS — Z3042 Encounter for surveillance of injectable contraceptive: Secondary | ICD-10-CM

## 2017-06-30 MED ORDER — MEDROXYPROGESTERONE ACETATE 150 MG/ML IM SUSP
150.0000 mg | Freq: Once | INTRAMUSCULAR | Status: AC
  Administered 2017-06-30: 150 mg via INTRAMUSCULAR

## 2017-06-30 NOTE — Progress Notes (Signed)
Pt Presents for Depo Injection today.   Left UOQ  Last Depo: 04/07/2017  Next Depo Due : Jun 17- Jul 1

## 2017-09-23 ENCOUNTER — Ambulatory Visit: Payer: Medicaid Other

## 2017-09-24 ENCOUNTER — Ambulatory Visit (INDEPENDENT_AMBULATORY_CARE_PROVIDER_SITE_OTHER): Payer: Medicaid Other | Admitting: *Deleted

## 2017-09-24 VITALS — BP 116/78 | HR 82 | Wt 124.0 lb

## 2017-09-24 DIAGNOSIS — Z3042 Encounter for surveillance of injectable contraceptive: Secondary | ICD-10-CM | POA: Diagnosis not present

## 2017-09-24 MED ORDER — MEDROXYPROGESTERONE ACETATE 150 MG/ML IM SUSP
150.0000 mg | Freq: Once | INTRAMUSCULAR | Status: AC
  Administered 2017-09-24: 150 mg via INTRAMUSCULAR

## 2017-09-24 NOTE — Progress Notes (Signed)
Pt is in office for depo injection. Pt supplied depo for today's visit. Pt tolerated injection well.  Pt advised to RTO 12/16/17 for next depo. Pt has no other concerns today.  BP 116/78   Pulse 82   Wt 124 lb (56.2 kg)   BMI 19.42 kg/m   Administrations This Visit    medroxyPROGESTERone (DEPO-PROVERA) injection 150 mg    Admin Date 09/24/2017 Action Given Dose 150 mg Route Intramuscular Administered By Lanney GinsFoster, Suzanne D, CMA

## 2017-09-25 NOTE — Progress Notes (Signed)
I have reviewed the chart and agree with nursing staff's documentation of this patient's encounter.  Jakaya Jacobowitz A Keera Altidor, CNM 09/25/2017 4:52 PM    

## 2017-12-16 ENCOUNTER — Ambulatory Visit: Payer: Medicaid Other

## 2017-12-23 ENCOUNTER — Telehealth: Payer: Self-pay | Admitting: *Deleted

## 2017-12-23 NOTE — Telephone Encounter (Signed)
Attempted to call patient to reschedule missed DEPO, no answer recording stated unable to receive calls ans unable to leave voicemail.Marland Kitchen..Marland Kitchen

## 2018-04-04 ENCOUNTER — Encounter (HOSPITAL_COMMUNITY): Payer: Self-pay | Admitting: Emergency Medicine

## 2018-04-04 ENCOUNTER — Ambulatory Visit (HOSPITAL_COMMUNITY)
Admission: EM | Admit: 2018-04-04 | Discharge: 2018-04-04 | Disposition: A | Discharge status: Home / Self Care | Payer: Self-pay | Attending: Family Medicine | Admitting: Family Medicine

## 2018-04-04 DIAGNOSIS — L02611 Cutaneous abscess of right foot: Secondary | ICD-10-CM | POA: Insufficient documentation

## 2018-04-04 MED ORDER — SULFAMETHOXAZOLE-TRIMETHOPRIM 800-160 MG PO TABS: 1.0000 | ORAL_TABLET | Freq: Two times a day (BID) | ORAL | 0 refills | Status: AC

## 2018-04-04 MED ORDER — HYDROCODONE-ACETAMINOPHEN 5-325 MG PO TABS: 1.0000 | ORAL_TABLET | Freq: Four times a day (QID) | ORAL | 0 refills | Status: DC | PRN

## 2018-04-04 NOTE — Discharge Instructions (Signed)
You have had an abscess drained today and you may have had packing placed in the wound to help the abscess continue to drain at home. If packing was placed, please do not remove it. You may shower with the packing in place. Let the soapy water clean your wound. Do not scrub it. Keep your wound covered. Follow up at the Urgent Care in 2 days for a wound check and/or packing removal. Return to the Urgent Care immediately if you develop any of the following symptoms: fever, Increased redness or swelling around where your abscess was, increased pain, or generalized weakness or vomiting. ° °Be aware, pain medications may cause drowsiness. Please do not drive, operate heavy machinery or make important decisions while on this medication, it can cloud your judgement. °

## 2018-04-04 NOTE — ED Provider Notes (Signed)
[facility]   [id number] 04/04/18 Arrival Time: 1503  ASSESSMENT & PLAN:  1. Abscess of great toe of right foot    Incision and Drainage Procedure Note  Anesthesia: 1% plain lidocaine  Procedure Details  The procedure, risks and complications have been discussed in detail (including, but not limited to pain and bleeding) with the patient.  The skin induration was prepped and draped in the usual fashion. After adequate local anesthesia, I&D with a #11 blade was performed on the inner right great toe. Purulent drainage: present.  EBL: minimal Drains: none Condition: Tolerated procedure well Complications: none.  Meds ordered this encounter  Medications  . HYDROcodone-acetaminophen (NORCO/VICODIN) 5-325 MG tablet    Sig: Take 1 tablet by mouth every 6 (six) hours as needed for moderate pain or severe pain.    Dispense:  8 tablet    Refill:  0  . sulfamethoxazole-trimethoprim (BACTRIM DS,SEPTRA DS) 800-160 MG tablet    Sig: Take 1 tablet by mouth 2 (two) times daily for 10 days.    Dispense:  20 tablet    Refill:  0   [place] Controlled Substances Registry consulted for this patient. I feel the risk/benefit ratio today is favorable for proceeding with this prescription for a controlled substance. Medication sedation precautions given.  Wound care instructions discussed and given in written format. She may soak this twice daily in Epsom salts. Otherwise keep clean and dry. To return in 48 hours for wound check.  Finish all antibiotics. OTC analgesics as needed.  Reviewed expectations re: course of current medical issues. Questions answered. Outlined signs and symptoms indicating need for more acute intervention. Patient verbalized understanding. After Visit Summary given.   SUBJECTIVE:  Kathryn Medina is a 28 y.o. female who presents with a possible infection of her inner R great toe. Describes mild swelling and tenderness over a few weeks "but just noticed some  redness and more pain over the top part of my toe a few days ago." Ambulatory without difficulty but with more pain of toe. No bleeding. No toe injury reported. No h/o similar. No illicit drug use reported.  ROS: As per HPI.  OBJECTIVE:  Vitals:   04/04/18 1518  BP: 115/71  Pulse: 66  Resp: 18  Temp: 97.7 F (36.5 C)  SpO2: 98%     General appearance: alert; no distress Skin: 1 cm but poorly defined induration of her inner superior R great toe; tender to touch; no active drainage or bleeding; nail intact and normal; R great toe with normal sensation and normal capillary refill; no bruising Psychological: alert and cooperative; normal mood and affect  Allergies  Allergen Reactions  . Other Hives    pork    Past Medical History:  Diagnosis Date  . Asthma    does not use inhaler   Social History   Socioeconomic History  . Marital status: Single    Spouse name: Not on file  . Number of children: Not on file  . Years of education: Not on file  . Highest education level: Not on file  Occupational History  . Not on file  Social Needs  . Financial resource strain: Not on file  . Food insecurity:    Worry: Not on file    Inability: Not on file  . Transportation needs:    Medical: Not on file    Non-medical: Not on file  Tobacco Use  . Smoking status: Current Every Day Smoker    Packs/day: 0.50  Years: 10.00    Pack years: 5.00    Types: Cigarettes  . Smokeless tobacco: Never Used  . Tobacco comment: pt is trying to use the electronic cigarette  Substance and Sexual Activity  . Alcohol use: No    Comment: social  . Drug use: Yes    Types: Oxycodone, Marijuana  . Sexual activity: Yes    Partners: Male    Birth control/protection: None  Lifestyle  . Physical activity:    Days per week: Not on file    Minutes per session: Not on file  . Stress: Not on file  Relationships  . Social connections:    Talks on phone: Not on file    Gets together: Not on file      Attends religious service: Not on file    Active member of club or organization: Not on file    Attends meetings of clubs or organizations: Not on file    Relationship status: Not on file  Other Topics Concern  . Not on file  Social History Narrative  . Not on file   Family History  Problem Relation Age of Onset  . Cancer Paternal Grandfather   . Cancer Paternal Grandmother   . Emphysema Maternal Grandmother   . Hypertension Father   . Cancer Father    Past Surgical History:  Procedure Laterality Date  . DILATION AND EVACUATION  03/22/2012   Procedure: DILATATION AND EVACUATION;  Surgeon: Brock Bad, MD;  Location: [facility];  Service: Gynecology;  Laterality: N/A;           Mardella Layman, MD 04/06/18 1011

## 2018-04-04 NOTE — ED Triage Notes (Signed)
Pt c/o cyst on her R big toe. Redness and swelling noted to R toe.

## 2018-07-14 IMAGING — US US MFM OB FOLLOW-UP
1 series · 13 of 28 positions shown · non-contrast
Comparison: none

[Series 1: us mfm ob follow-up · 81 acquisitions, 13 frames shown]
[im 3/81]
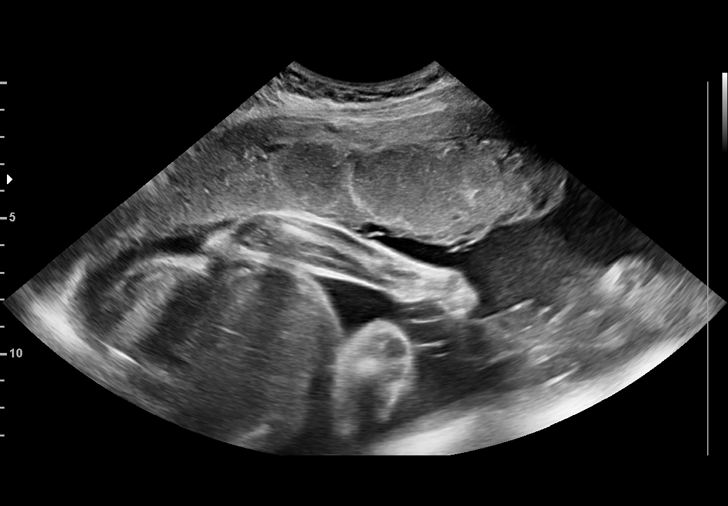
[im 9/81]
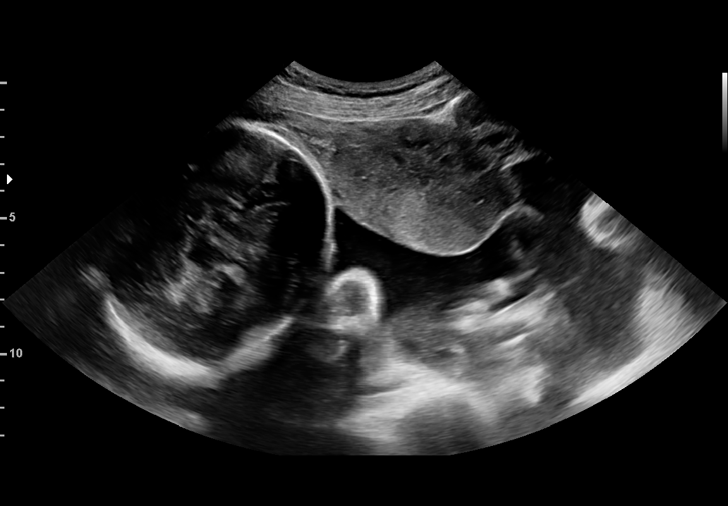
[im 15/81]
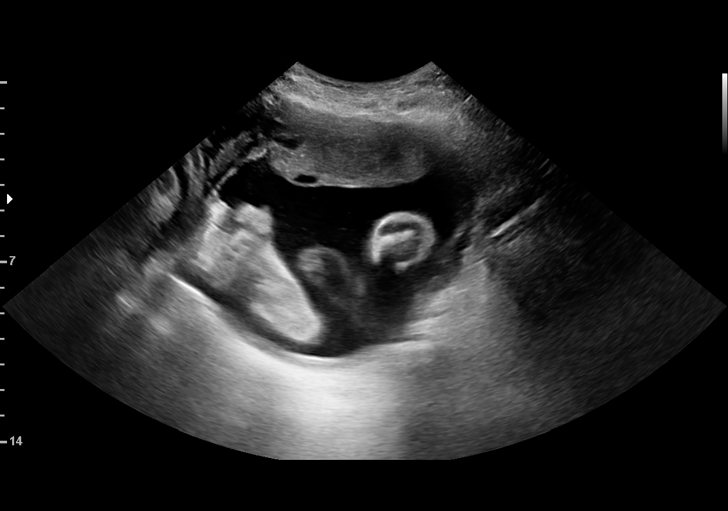
[im 21/81]
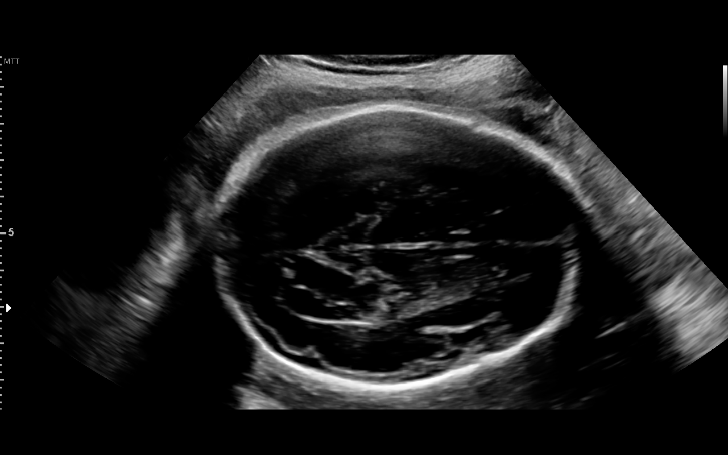
[im 27/81]
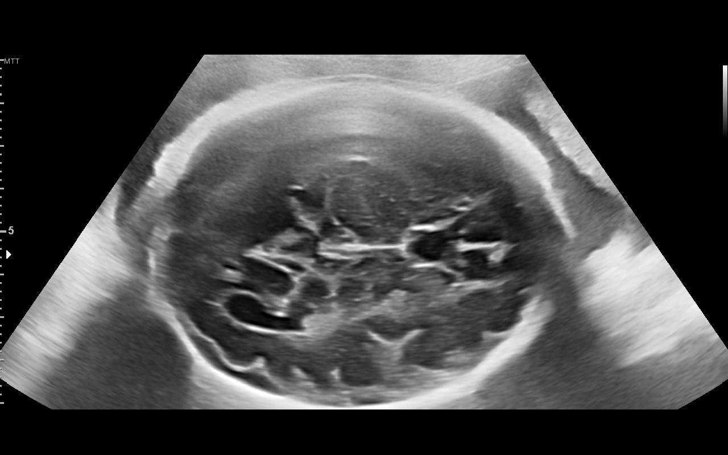
[im 33/81]
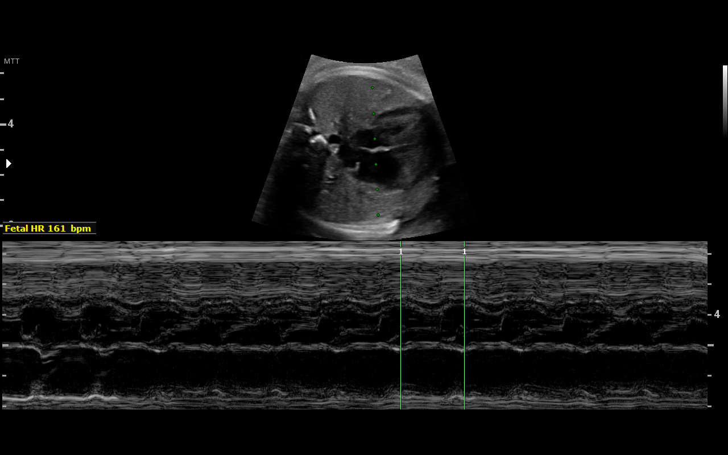
[im 42/81]
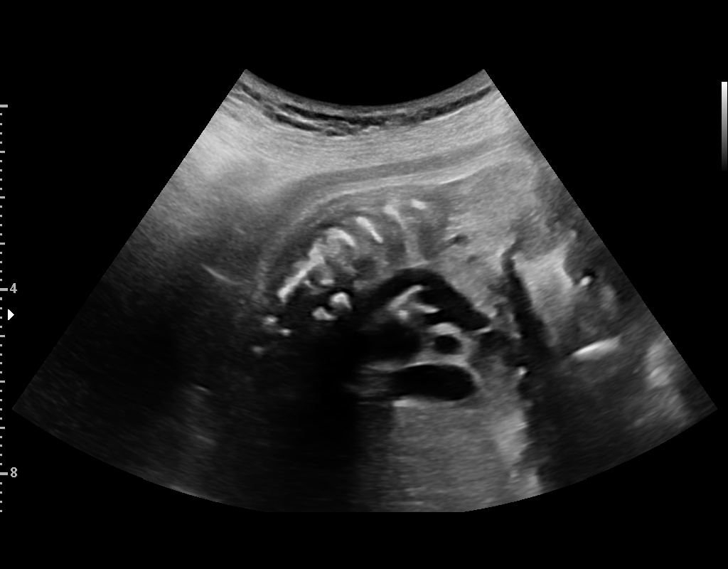
[im 48/81]
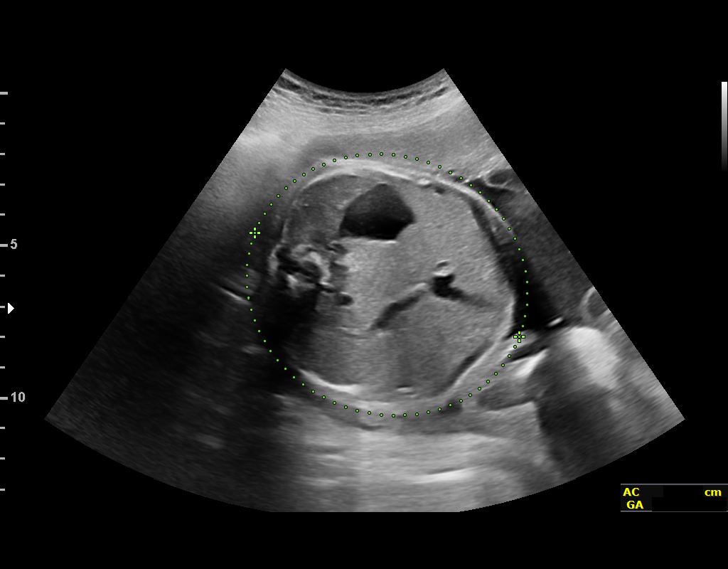
[im 54/81]
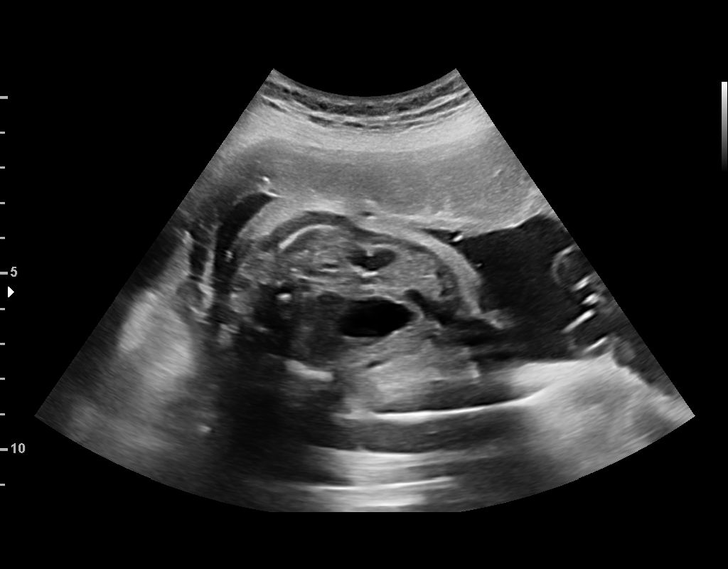
[im 60/81]
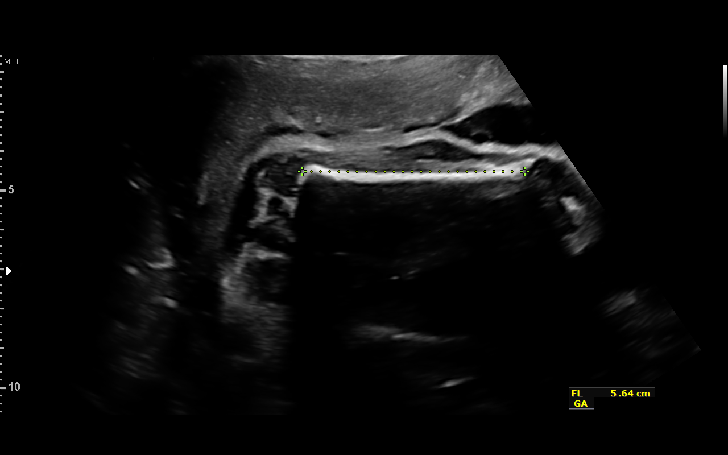
[im 66/81]
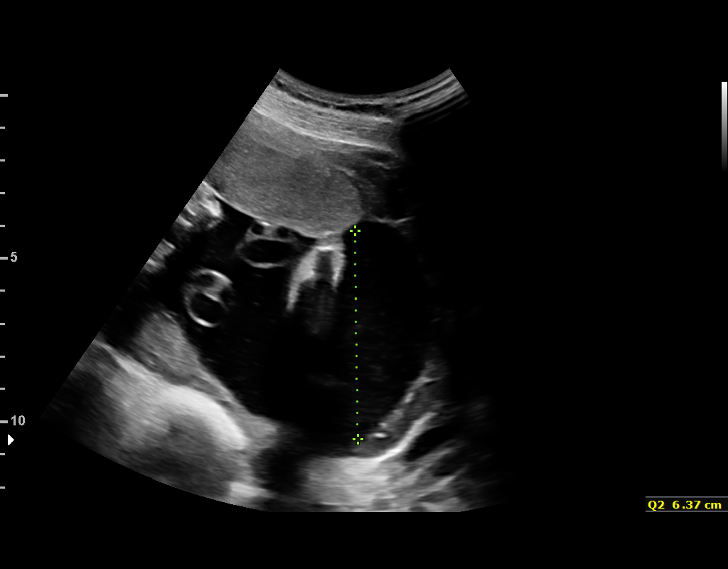
[im 72/81]
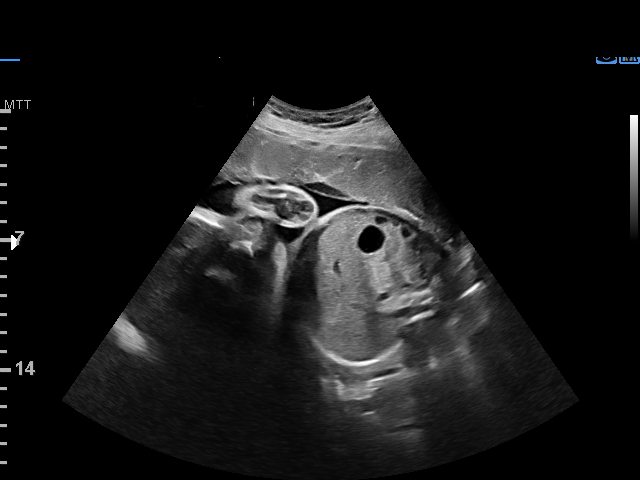
[im 78/81]
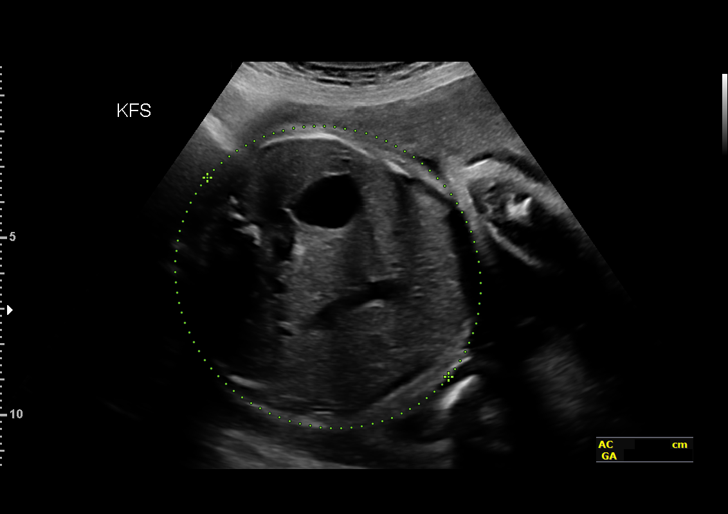

[13 of 28 positions shown; findings below may reference images not displayed]

([HOSPITAL])

1  GAMOUSA JUMEAU           [id number]      [id number]     [id number]
Indications

30 weeks gestation of pregnancy
Uterine size-date discrepancy, third trimester
Encounter for other antenatal screening
follow-up
Tobacco use complicating pregnancy, third
trimester
Drug use complicating pregnancy, third
trimester (+THC, Oxycodone)
OB History

Blood Type:            Height:  5'7"   Weight (lb):  148      BMI:
Gravidity:    2         Term:   0        Prem:   0        SAB:   1
TOP:          0       Ectopic:  0        Living: 0
Fetal Evaluation

Num Of Fetuses:     1
Fetal Heart         161
Rate(bpm):
Cardiac Activity:   Observed
Presentation:       Transverse, head to maternal right
Placenta:           Anterior, above cervical os
P. Cord Insertion:  Previously Visualized
Amniotic Fluid
AFI FV:      Subjectively within normal limits

AFI Sum(cm)     %Tile       Largest Pocket(cm)
17.41           65

RUQ(cm)       RLQ(cm)       LUQ(cm)        LLQ(cm)
4.71
Biometry

BPD:      76.3  mm     G. Age:  30w 4d         58  %    CI:        74.59   %   70 - 86
FL/HC:      19.8   %   19.2 -
HC:      280.4  mm     G. Age:  30w 5d         35  %    HC/AC:      1.00       0.99 -
AC:      281.6  mm     G. Age:  32w 1d         94  %    FL/BPD:     72.6   %   71 - 87
FL:       55.4  mm     G. Age:  29w 1d         17  %    FL/AC:      19.7   %   20 - 24
HUM:      49.1  mm     G. Age:  29w 0d         29  %
CER:      37.6  mm     G. Age:  32w 2d         79  %
CM:        6.3  mm

Est. FW:    0616  gm    3 lb 11 oz      72  %
Gestational Age

U/S Today:     30w 5d                                        EDD:   08/28/16
Best:          30w 0d    Det. By:   Previous Ultrasound      EDD:   09/02/16
(01/25/16)
Anatomy

Cranium:               Appears normal         Aortic Arch:            Appears normal
Cavum:                 Previously seen        Ductal Arch:            Appears normal
Ventricles:            Appears normal         Diaphragm:              Appears normal
Choroid Plexus:        Previously seen        Stomach:                Appears normal, left
sided
Cerebellum:            Appears normal         Abdomen:                Previously seen
Posterior Fossa:       Previously seen        Abdominal Wall:         Previously seen
Nuchal Fold:           Previously seen        Cord Vessels:           Previously seen
Face:                  Orbits and profile     Kidneys:                Appear normal
previously seen
Lips:                  Previously seen        Bladder:                Appears normal
Thoracic:              Previously seen        Spine:                  Previously seen
Heart:                 Appears normal         Upper Extremities:      Previously seen
(4CH, axis, and situs
RVOT:                  Appears normal         Lower Extremities:      Previously seen
LVOT:                  Appears normal

Other:  Female gender previously seen. Heels and 5th digit previously seen.
Technically difficult due to advanced GA and fetal position.
Cervix Uterus Adnexa

Cervix
Not visualized (advanced GA >87wks)

Uterus
No abnormality visualized.

Left Ovary
Not visualized. No adnexal mass visualized.
Right Ovary
Size(cm)     1.92  x    1.88   x  1.44      Vol(ml):
Within normal limits. No adnexal mass visualized.

Cul De Sac:   No free fluid seen.

Adnexa:       No abnormality visualized.
Impression

Singleton intrauterine pregnancy at 30+0 here for size-dates
discrepancy and substance abuse
Review of the anatomy shows no sonographic markers for
aneuploidy or structural anomalies
Amniotic fluid volume is normal with an AFI of 17.4cm
Estimated fetal weight is 1682g which is growth in the 72nd
percentile
Recommendations

Normal growth and development. Follow-up ultrasounds as
clinically indicated.

## 2018-11-17 ENCOUNTER — Encounter (HOSPITAL_COMMUNITY): Payer: Self-pay | Admitting: Emergency Medicine

## 2018-11-17 ENCOUNTER — Other Ambulatory Visit: Payer: Self-pay

## 2018-11-17 ENCOUNTER — Emergency Department (HOSPITAL_COMMUNITY)
Admission: EM | Admit: 2018-11-17 | Discharge: 2018-11-19 | Disposition: A | Discharge status: Other Acute Inpatient Hospital | Payer: Self-pay | Attending: Emergency Medicine | Admitting: Emergency Medicine

## 2018-11-17 DIAGNOSIS — F329 Major depressive disorder, single episode, unspecified: Secondary | ICD-10-CM | POA: Insufficient documentation

## 2018-11-17 DIAGNOSIS — T1491XA Suicide attempt, initial encounter: Secondary | ICD-10-CM | POA: Insufficient documentation

## 2018-11-17 DIAGNOSIS — T401X2A Poisoning by heroin, intentional self-harm, initial encounter: Secondary | ICD-10-CM | POA: Insufficient documentation

## 2018-11-17 DIAGNOSIS — Z8709 Personal history of other diseases of the respiratory system: Secondary | ICD-10-CM | POA: Insufficient documentation

## 2018-11-17 DIAGNOSIS — Y939 Activity, unspecified: Secondary | ICD-10-CM | POA: Insufficient documentation

## 2018-11-17 DIAGNOSIS — Z20828 Contact with and (suspected) exposure to other viral communicable diseases: Secondary | ICD-10-CM | POA: Insufficient documentation

## 2018-11-17 DIAGNOSIS — Y929 Unspecified place or not applicable: Secondary | ICD-10-CM | POA: Insufficient documentation

## 2018-11-17 DIAGNOSIS — Z6379 Other stressful life events affecting family and household: Secondary | ICD-10-CM | POA: Insufficient documentation

## 2018-11-17 DIAGNOSIS — X58XXXA Exposure to other specified factors, initial encounter: Secondary | ICD-10-CM | POA: Insufficient documentation

## 2018-11-17 DIAGNOSIS — Y999 Unspecified external cause status: Secondary | ICD-10-CM | POA: Insufficient documentation

## 2018-11-17 DIAGNOSIS — F17210 Nicotine dependence, cigarettes, uncomplicated: Secondary | ICD-10-CM | POA: Insufficient documentation

## 2018-11-17 NOTE — ED Triage Notes (Signed)
Patient here reporting suicidal ideation. Plan to OD on heroin. Last use today. AAO x4, ambulatory. Patient is yelling and cursing in triage. Patient threatening to leave.

## 2018-11-18 ENCOUNTER — Other Ambulatory Visit: Payer: Self-pay

## 2018-11-18 ENCOUNTER — Encounter (HOSPITAL_COMMUNITY): Payer: Self-pay | Admitting: Registered Nurse

## 2018-11-18 LAB — COMPREHENSIVE METABOLIC PANEL
ALT: 26 U/L (ref 0–44)
AST: 33 U/L (ref 15–41)
Albumin: 3.9 g/dL (ref 3.5–5.0)
Alkaline Phosphatase: 96 U/L (ref 38–126)
Anion gap: 12 (ref 5–15)
BUN: 7 mg/dL (ref 6–20)
CO2: 23 mmol/L (ref 22–32)
Calcium: 9.5 mg/dL (ref 8.9–10.3)
Chloride: 104 mmol/L (ref 98–111)
Creatinine, Ser: 0.62 mg/dL (ref 0.44–1.00)
GFR calc Af Amer: >60 mL/min (ref >60)
GFR calc non Af Amer: >60 mL/min (ref >60)
Glucose, Bld: 77 mg/dL (ref 70–99)
Potassium: 4.6 mmol/L (ref 3.5–5.1)
Sodium: 139 mmol/L (ref 135–145)
Total Bilirubin: 0.4 mg/dL (ref 0.3–1.2)
Total Protein: 8.2 g/dL — ABNORMAL HIGH (ref 6.5–8.1)

## 2018-11-18 LAB — CBC
HCT: 45.2 % (ref 36.0–46.0)
Hemoglobin: 15.6 g/dL — ABNORMAL HIGH (ref 12.0–15.0)
MCH: 33.1 pg (ref 26.0–34.0)
MCHC: 34.5 g/dL (ref 30.0–36.0)
MCV: 96 fL (ref 80.0–100.0)
Platelets: 256 10*3/uL (ref 150–400)
RBC: 4.71 MIL/uL (ref 3.87–5.11)
RDW: 13.2 % (ref 11.5–15.5)
WBC: 9.6 10*3/uL (ref 4.0–10.5)
nRBC: 0 % (ref 0.0–0.2)

## 2018-11-18 LAB — RAPID URINE DRUG SCREEN, HOSP PERFORMED
Amphetamines: NOT DETECTED
Barbiturates: NOT DETECTED
Benzodiazepines: NOT DETECTED
Cocaine: NOT DETECTED
Opiates: POSITIVE — AB
Tetrahydrocannabinol: POSITIVE — AB

## 2018-11-18 LAB — SARS CORONAVIRUS 2 BY RT PCR (HOSPITAL ORDER, PERFORMED IN ~~LOC~~ HOSPITAL LAB): SARS Coronavirus 2: NEGATIVE

## 2018-11-18 LAB — I-STAT BETA HCG BLOOD, ED (MC, WL, AP ONLY): I-stat hCG, quantitative: <5 m[IU]/mL (ref <5)

## 2018-11-18 LAB — ACETAMINOPHEN LEVEL: Acetaminophen (Tylenol), Serum: <10 ug/mL — ABNORMAL LOW (ref 10–30)

## 2018-11-18 LAB — SALICYLATE LEVEL: Salicylate Lvl: <7 mg/dL (ref 2.8–30.0)

## 2018-11-18 LAB — ETHANOL: Alcohol, Ethyl (B): 69 mg/dL — ABNORMAL HIGH (ref <10)

## 2018-11-18 MED ORDER — HYDROXYZINE HCL 25 MG PO TABS: 25.0000 mg | ORAL_TABLET | Freq: Four times a day (QID) | ORAL | Status: DC | PRN

## 2018-11-18 MED ORDER — CLONIDINE HCL 0.1 MG PO TABS: 0.1000 mg | ORAL_TABLET | Freq: Every day | ORAL | Status: DC

## 2018-11-18 MED ORDER — CLONIDINE HCL 0.1 MG PO TABS
0.1000 mg | ORAL_TABLET | Freq: Four times a day (QID) | ORAL | Status: DC
  Administered 2018-11-19: 0.1 mg via ORAL
  Filled 2018-11-18 (×3): qty 1

## 2018-11-18 MED ORDER — SODIUM CHLORIDE 0.9 % IV BOLUS
500.0000 mL | Freq: Once | INTRAVENOUS | Status: AC
  Administered 2018-11-18: 08:00:00 500 mL via INTRAVENOUS

## 2018-11-18 MED ORDER — ZIPRASIDONE MESYLATE 20 MG IM SOLR
10.0000 mg | Freq: Once | INTRAMUSCULAR | Status: AC
  Administered 2018-11-18: 02:00:00 10 mg via INTRAMUSCULAR
  Filled 2018-11-18: qty 20

## 2018-11-18 MED ORDER — LORAZEPAM 2 MG/ML IJ SOLN
0.0000 mg | Freq: Four times a day (QID) | INTRAMUSCULAR | Status: DC
  Administered 2018-11-18: 2 mg via INTRAVENOUS
  Filled 2018-11-18: qty 1

## 2018-11-18 MED ORDER — LORAZEPAM 1 MG PO TABS
0.0000 mg | ORAL_TABLET | Freq: Four times a day (QID) | ORAL | Status: DC
  Administered 2018-11-18 – 2018-11-19 (×4): 1 mg via ORAL
  Filled 2018-11-18 (×2): qty 1
  Filled 2018-11-18: qty 2
  Filled 2018-11-18 (×3): qty 1

## 2018-11-18 MED ORDER — ONDANSETRON HCL 4 MG/2ML IJ SOLN
4.0000 mg | Freq: Once | INTRAMUSCULAR | Status: AC
  Administered 2018-11-18: 4 mg via INTRAVENOUS
  Filled 2018-11-18: qty 2

## 2018-11-18 MED ORDER — NALOXONE HCL 2 MG/2ML IJ SOSY
PREFILLED_SYRINGE | INTRAMUSCULAR | Status: AC
  Filled 2018-11-18: qty 2

## 2018-11-18 MED ORDER — LOPERAMIDE HCL 2 MG PO CAPS
2.0000 mg | ORAL_CAPSULE | ORAL | Status: DC | PRN
  Administered 2018-11-19: 4 mg via ORAL
  Filled 2018-11-18: qty 2

## 2018-11-18 MED ORDER — LORAZEPAM 2 MG/ML IJ SOLN: 0.0000 mg | Freq: Two times a day (BID) | INTRAMUSCULAR | Status: DC

## 2018-11-18 MED ORDER — NAPROXEN 500 MG PO TABS
500.0000 mg | ORAL_TABLET | Freq: Two times a day (BID) | ORAL | Status: DC | PRN
  Administered 2018-11-19: 500 mg via ORAL
  Filled 2018-11-18: qty 1

## 2018-11-18 MED ORDER — ONDANSETRON 4 MG PO TBDP: 4.0000 mg | ORAL_TABLET | Freq: Four times a day (QID) | ORAL | Status: DC | PRN

## 2018-11-18 MED ORDER — LORAZEPAM 1 MG PO TABS: 0.0000 mg | ORAL_TABLET | Freq: Two times a day (BID) | ORAL | Status: DC

## 2018-11-18 MED ORDER — STERILE WATER FOR INJECTION IJ SOLN
INTRAMUSCULAR | Status: AC
  Administered 2018-11-18: 1.2 mL
  Filled 2018-11-18: qty 10

## 2018-11-18 MED ORDER — METHOCARBAMOL 500 MG PO TABS
500.0000 mg | ORAL_TABLET | Freq: Three times a day (TID) | ORAL | Status: DC | PRN
  Administered 2018-11-19: 500 mg via ORAL
  Filled 2018-11-18: qty 1

## 2018-11-18 MED ORDER — VITAMIN B-1 100 MG PO TABS
100.0000 mg | ORAL_TABLET | Freq: Every day | ORAL | Status: DC
  Administered 2018-11-18 – 2018-11-19 (×3): 100 mg via ORAL
  Filled 2018-11-18 (×2): qty 1

## 2018-11-18 MED ORDER — CLONIDINE HCL 0.1 MG PO TABS
0.1000 mg | ORAL_TABLET | ORAL | Status: DC
  Administered 2018-11-18: 0.1 mg via ORAL

## 2018-11-18 MED ORDER — DICYCLOMINE HCL 20 MG PO TABS
20.0000 mg | ORAL_TABLET | Freq: Four times a day (QID) | ORAL | Status: DC | PRN
  Administered 2018-11-19: 20 mg via ORAL
  Filled 2018-11-18: qty 1

## 2018-11-18 MED ORDER — THIAMINE HCL 100 MG/ML IJ SOLN: 100.0000 mg | Freq: Every day | INTRAMUSCULAR | Status: DC

## 2018-11-18 NOTE — [facility] Counselor (Signed)
Attempted assessment.  Cart not picking up/no answer.

## 2018-11-18 NOTE — ED Notes (Signed)
Unable to obtain orthostatic at this time. Will try again later

## 2018-11-18 NOTE — ED Notes (Signed)
Pt put back on cardiac monitor and vitals restarted.

## 2018-11-18 NOTE — BH Assessment (Signed)
Tele Assessment Note   Patient Name: Kathryn Medina MRN: [id number] Referring Physician: Gari Crown Location of Patient: [facility] Location of Provider: [place] is an 28 y.o. female present to [place] after an initial overdose of heroin. TTS writer seen patient with Dr. Mariea Clonts and Delphia Grates, NP. Patient report she attempted suicide via overdose of IV Heroin usage. Report she attempted suicide after she was released from jail. Report she was in jail for about 3 hours for larceny charge. Patient did not provide detail information on stressors in her life which triggered her suicide attempt. Denied suicide intent in the past. Denied past history of inpatient psychiatric care. Denied homicidal ideations. Denied auditory / visual hallucination. Denied feelings are paranoia.   Disposition: Dr. 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 Asthma    does not use inhaler    Past Surgical History:  Procedure Laterality Date  . DILATION AND EVACUATION  03/22/2012   Procedure: DILATATION AND EVACUATION;  Surgeon: Shelly Bombard, MD;  Location: [facility];  Service: Gynecology;  Laterality: N/A;    Family History:  Family History  Problem Relation Age of Onset  . Cancer Paternal Grandfather   . Cancer Paternal Grandmother   . Emphysema Maternal Grandmother   . Hypertension Father   . Cancer Father     Social History:  reports that she has been smoking cigarettes. She has a 5.00 pack-year smoking history. She has never used smokeless tobacco. She reports current drug use. Drugs: Oxycodone and Marijuana. She reports that she does not drink alcohol.  Additional Social History:  Alcohol / Drug Use Pain Medications: see MAR Prescriptions: see MAR Over the  Counter: see MAR History of alcohol / drug use?: Yes Substance #1 Name of Substance 1: Heroin 1 - Age of First Use: 64 (report started using a few months ago) 1 - Amount (size/oz): unknowns 1 - Frequency: daily 1 - Duration: ongoing 1 - Last Use / Amount: 11/17/2018  CIWA: CIWA-Ar BP: 124/82 Pulse Rate: (!) 56 COWS: Clinical Opiate Withdrawal Scale (COWS) Resting Pulse Rate: Pulse Rate 80 or below Sweating: Subjective report of chills or flushing Restlessness: Reports difficulty sitting still, but is able to do so Pupil Size: Pupils pinned or normal size for room light Bone or Joint Aches: Mild diffuse discomfort Runny Nose or Tearing: Not present GI Upset: Vomiting or diarrhea Tremor: Tremor can be felt, but not observed Yawning: No yawning Anxiety or Irritability: None Gooseflesh Skin: Skin is smooth COWS Total Score: 7  Allergies:  Allergies  Allergen Reactions  . Other Hives    pork    Home Medications: (Not in a hospital admission)   OB/GYN Status:  No LMP recorded.  General Assessment Data Assessment unable to be completed: Yes Reason for not completing assessment: Numerous attempts to set up cart; no response(in [facility]) Location of Assessment: [facility] TTS Assessment: In system Is this a Tele or Face-to-Face Assessment?: Tele Assessment Is this an Initial Assessment or a Re-assessment for this encounter?: Initial Assessment Patient Accompanied by:: N/A Language Other than English: No Living Arrangements: Other (Comment)(lives with bf and child ) What gender do you identify as?: Female Marital status: Single Living Arrangements: Spouse/significant other Can pt return to current living arrangement?: Yes Admission Status: Involuntary  Petitioner: ED Attending Is patient capable of signing voluntary admission?: No Referral Source: Self/Family/Friend Insurance type: self-pay      Crisis Care Plan Living Arrangements: Spouse/significant other Name of  Psychiatrist: none report  Name of Therapist: none report   Education Status Is patient currently in school?: No Is the patient employed, unemployed or receiving disability?: Unemployed  Risk to self with the past 6 months Suicidal Ideation: Yes-Currently Present Has patient been a risk to self within the past 6 months prior to admission? : No Suicidal Intent: Yes-Currently Present Has patient had any suicidal intent within the past 6 months prior to admission? : No Is patient at risk for suicide?: Yes Suicidal Plan?: Yes-Currently Present Has patient had any suicidal plan within the past 6 months prior to admission? : No Specify Current Suicidal Plan: overdose on heroin  Access to Means: Yes Specify Access to Suicidal Means: purchase heroin off the street  What has been your use of drugs/alcohol within the last 12 months?: heroin  Previous Attempts/Gestures: No(pt denied ) How many times?: 1(pt report this is her 1st attempt ) Other Self Harm Risks: IV drug use (heroin)  Triggers for Past Attempts: None known Intentional Self Injurious Behavior: Damaging(IV drug use (heroin) ) Comment - Self Injurious Behavior: IV drug use  Family Suicide History: No Recent stressful life event(s): Other (Comment)(pt did not provide detail information ) Persecutory voices/beliefs?: No Depression: Yes Depression Symptoms: Feeling worthless/self pity, Loss of interest in usual pleasures, Isolating Substance abuse history and/or treatment for substance abuse?: No Suicide prevention information given to non-admitted patients: Not applicable  Risk to Others within the past 6 months Homicidal Ideation: No Does patient have any lifetime risk of violence toward others beyond the six months prior to admission? : No Thoughts of Harm to Others: No Current Homicidal Intent: No Current Homicidal Plan: No Access to Homicidal Means: No Identified Victim: n/a History of harm to others?: No Assessment of  Violence: None Noted Violent Behavior Description: None noted Does patient have access to weapons?: No Criminal Charges Pending?: No Does patient have a court date: No Is patient on probation?: No  Psychosis Hallucinations: None noted Delusions: None noted  Mental Status Report Appearance/Hygiene: In scrubs Eye Contact: Fair Motor Activity: Freedom of movement Speech: Logical/coherent Level of Consciousness: Alert Mood: Labile, Depressed Affect: Labile, Depressed Anxiety Level: None Thought Processes: Coherent, Relevant Judgement: Impaired Orientation: Person, Place, Time, Situation, Appropriate for developmental age Obsessive Compulsive Thoughts/Behaviors: None  Cognitive Functioning Concentration: Good Memory: Recent Intact, Remote Intact Is patient IDD: No Insight: Poor Impulse Control: Poor Appetite: Fair Have you had any weight changes? : No Change Sleep: Decreased Total Hours of Sleep: 3 Vegetative Symptoms: None  ADLScreening [facility] Assessment Services) Patient's cognitive ability adequate to safely complete daily activities?: Yes Patient able to express need for assistance with ADLs?: Yes Independently performs ADLs?: Yes (appropriate for developmental age)  Prior Inpatient Therapy Prior Inpatient Therapy: No  Prior Outpatient Therapy Prior Outpatient Therapy: No Does patient have an ACCT team?: No Does patient have Intensive In-House Services?  : No Does patient have [facility] services? : No Does patient have P4CC services?: No  ADL Screening (condition at time of admission) Patient's cognitive ability adequate to safely complete daily activities?: Yes Is the patient deaf or have difficulty hearing?: No Does the patient have difficulty seeing, even when wearing glasses/contacts?: No Does the patient have difficulty concentrating, remembering, or making decisions?: No Patient able to express need for assistance with ADLs?:  Yes Does the patient have  difficulty dressing or bathing?: No Independently performs ADLs?: Yes (appropriate for developmental age) Does the patient have difficulty walking or climbing stairs?: No       Abuse/Neglect Assessment (Assessment to be complete while patient is alone) Abuse/Neglect Assessment Can Be Completed: Yes Physical Abuse: Denies Verbal Abuse: Denies Sexual Abuse: Denies Exploitation of patient/patient's resources: Denies Self-Neglect: Denies     Merchant navy officerAdvance Directives (For Healthcare) Does Patient Have a Medical Advance Directive?: No Would patient like information on creating a medical advance directive?: No - Patient declined          Disposition:  Disposition Initial Assessment Completed for this Encounter: Yes(Dr. Rod CanNorman & Shuvon, NP, recommend inpt tx )   Kathryn Medina 11/18/2018 1:03 PM

## 2018-11-18 NOTE — ED Notes (Addendum)
Patient attempting to leave the department, verbal redirection attempted, unsuccessfully. Patient verbally aggressive, verbal orders given to another RN. Security at bedside, attempting to get patient to stay in room.

## 2018-11-18 NOTE — BH Assessment (Signed)
[facility] Assessment Progress Note  Patient was seen by two TTS Clinicians, Delvondria Dubse and Cinthya Bors.    Addendum to Assessment:  Patient states that she was incarcerated on an outstanding warrant yesterday and was placed in jail.  She has been charged with [employer] of [employer], music.  She states that she was in jail for several hours and released. Patient states that she is facing time if convicted of this charge.  Patient states that she is stressed out and feeling overwhelmed.  Two months ago, patient states that she lost four years of sobriety and states that she has been using a gram of heroin daily for the past two months.  Patient states that she has been using heroin since the age of 88.  Patient states that she is living with her boyfriend, who is not a drug user, and her two year old child.  She states that her relationship is strained due to her use, but states that her boyfriend is still supportive. She states that she is not currently working and finances are tight especially because of her using and not contributing to the household income.   Patient states that she is not clear of all the events that occurred yesterday, but she cannot rule out that she purposely overdosed to kill herself. She denies any prior suicide attempts and states that she has never pursued any mental health or substance abuse treatment. Patient states that she has never been homicidal and denies any history of psychosis.  Patient states that she is currently sleeping less than five hours a night and she states that she feels tired all of the time.  She states that she has experienced a loss of appetite and when asked if she has lost weight, she states, "probably."  Patient was very drowsy during her assessment process and did not provide much detailed information.

## 2018-11-18 NOTE — ED Provider Notes (Signed)
[facility]-EMERGENCY DEPT Provider Note   CSN: [id number] Arrival date & time: 11/17/18  2245    History   Chief Complaint Chief Complaint  Patient presents with  . Suicidal    HPI Kathryn Medina is a 28 y.o. female history of asthma and substance abuse.  Initial evaluation Called to triage room #4 as patient found on the ground having "passed out".  By the time I arrived in the room patient was sitting in chair.  She denies any preceding chest pain or shortness of breath. She denies any pain reports doing an unknown amount of heroin approximately 3 hours ago in order to take her own life.  No focal neuro deficits, fully alert and oriented no acute distress.  Denies any pain. Tachycardic, vitals otherwise wnl. - Reassessment Patient reports increased stress with life, will not go into specific details regarding this reports that she wants to kill herself with overdosing on heroin, she has no homicidal ideations or hallucinations.  She denies any fever/chills, injury, ingestions, self-harm or pain.   HPI  Past Medical History:  Diagnosis Date  . Asthma    does not use inhaler    Patient Active Problem List   Diagnosis Date Noted  . Non-reassuring fetal heart tones complicating pregnancy, antepartum 08/27/2016  . Back pain affecting pregnancy 04/16/2016  . Drug abuse during pregnancy (HCC) 03/19/2016  . Supervision of normal first pregnancy 01/23/2016    Past Surgical History:  Procedure Laterality Date  . DILATION AND EVACUATION  03/22/2012   Procedure: DILATATION AND EVACUATION;  Surgeon: Brock Badharles A Harper, MD;  Location: [facility];  Service: Gynecology;  Laterality: N/A;     OB History    Gravida  2   Para  1   Term  1   Preterm  0   AB  1   Living  1     SAB  1   TAB  0   Ectopic  0   Multiple  0   Live Births  1            Home Medications    Prior to Admission medications   Medication Sig Start Date End Date Taking?  Authorizing Provider  ferrous sulfate 325 (65 FE) MG EC tablet Take 325 mg by mouth daily with breakfast.     [provider]  HYDROcodone-acetaminophen (NORCO/VICODIN) 5-325 MG tablet Take 1 tablet by mouth every 6 (six) hours as needed for moderate pain or severe pain. 04/04/18   Mardella LaymanHagler, Brian, MD  ibuprofen (ADVIL,MOTRIN) 600 MG tablet Take 1 tablet (600 mg total) by mouth every 6 (six) hours. Patient not taking: Reported on 01/14/2017 08/31/16   Howard PouchFeng, Lauren, MD  ibuprofen (ADVIL,MOTRIN) 800 MG tablet Take 1 tablet (800 mg total) by mouth every 8 (eight) hours as needed. Patient not taking: Reported on 01/14/2017 01/08/17   Brock BadHarper, Charles A, MD  medroxyPROGESTERone (DEPO-PROVERA) 150 MG/ML injection Inject 1 mL (150 mg total) into the muscle every 3 (three) months. Patient not taking: Reported on 04/04/2018 01/08/17   Brock BadHarper, Charles A, MD  ondansetron (ZOFRAN ODT) 4 MG disintegrating tablet Take 1 tablet (4 mg total) by mouth every 8 (eight) hours as needed for nausea or vomiting. Patient not taking: Reported on 04/04/2018 05/05/17   Gerhard MunchLockwood, Robert, MD  prenatal vitamin w/FE, FA (PRENATAL 1 + 1) 27-1 MG TABS tablet Take 1 tablet by mouth daily at 12 noon. Patient not taking: Reported on 04/04/2018 06/04/16   Clearance CootsHarper,  Bing Neighborsharles A, MD  senna-docusate (SENOKOT-S) 8.6-50 MG tablet Take 1 tablet by mouth at bedtime as needed for mild constipation. Patient not taking: Reported on 01/14/2017 08/31/16   Howard PouchFeng, Lauren, MD    Family History Family History  Problem Relation Age of Onset  . Cancer Paternal Grandfather   . Cancer Paternal Grandmother   . Emphysema Maternal Grandmother   . Hypertension Father   . Cancer Father     Social History Social History   Tobacco Use  . Smoking status: Current Every Day Smoker    Packs/day: 0.50    Years: 10.00    Pack years: 5.00    Types: Cigarettes  . Smokeless tobacco: Never Used  . Tobacco comment: pt is trying to use the electronic cigarette   Substance Use Topics  . Alcohol use: No    Comment: social  . Drug use: Yes    Types: Oxycodone, Marijuana    Comment: heroin      Allergies   Other   Review of Systems Review of Systems Ten systems are reviewed and are negative for acute change except as noted in the HPI  Physical Exam Updated Vital Signs BP 110/79 (BP Location: Right Arm)   Pulse 65   Temp 98.5 F (36.9 C) (Oral)   Resp (!) 21   SpO2 97%   Physical Exam Constitutional:      General: She is not in acute distress.    Appearance: Normal appearance. She is well-developed. She is not ill-appearing or diaphoretic.  HENT:     Head: Normocephalic and atraumatic. No raccoon eyes or Battle's sign.     Jaw: There is normal jaw occlusion.     Right Ear: External ear normal.     Left Ear: External ear normal.     Nose: Nose normal. No rhinorrhea.     Right Nostril: No epistaxis.     Left Nostril: No epistaxis.     Mouth/Throat:     Mouth: Mucous membranes are moist.     Pharynx: Oropharynx is clear.  Eyes:     General: Vision grossly intact. Gaze aligned appropriately.     Extraocular Movements: Extraocular movements intact.     Conjunctiva/sclera: Conjunctivae normal.     Pupils: Pupils are equal, round, and reactive to light.  Neck:     Musculoskeletal: Full passive range of motion without pain, normal range of motion and neck supple.     Trachea: Trachea and phonation normal. No tracheal deviation.  Cardiovascular:     Rate and Rhythm: Normal rate and regular rhythm.     Heart sounds: Normal heart sounds.  Pulmonary:     Effort: Pulmonary effort is normal. No accessory muscle usage or respiratory distress.     Breath sounds: Normal breath sounds and air entry.  Abdominal:     General: There is no distension.     Palpations: Abdomen is soft.     Tenderness: There is no abdominal tenderness. There is no guarding or rebound.  Musculoskeletal: Normal range of motion.     Comments: Moves extremities x4  without difficulty  Skin:    General: Skin is warm and dry.  Neurological:     Mental Status: She is alert.     GCS: GCS eye subscore is 4. GCS verbal subscore is 5. GCS motor subscore is 6.     Comments: Speech is clear and goal oriented, follows commands Major Cranial nerves without deficit, no facial droop Moves extremities without ataxia, coordination  intact  Psychiatric:        Behavior: Behavior normal.    ED Treatments / Results  Labs (all labs ordered are listed, but only abnormal results are displayed) Labs Reviewed  COMPREHENSIVE METABOLIC PANEL - Abnormal; Notable for the following components:      Result Value   Total Protein 8.2 (*)    All other components within normal limits  ETHANOL - Abnormal; Notable for the following components:   Alcohol, Ethyl (B) 69 (*)    All other components within normal limits  ACETAMINOPHEN LEVEL - Abnormal; Notable for the following components:   Acetaminophen (Tylenol), Serum <10 (*)    All other components within normal limits  CBC - Abnormal; Notable for the following components:   Hemoglobin 15.6 (*)    All other components within normal limits  RAPID URINE DRUG SCREEN, HOSP PERFORMED - Abnormal; Notable for the following components:   Opiates POSITIVE (*)    Tetrahydrocannabinol POSITIVE (*)    All other components within normal limits  SALICYLATE LEVEL  I-STAT BETA HCG BLOOD, ED ([facility], [facility], [facility] ONLY)    EKG EKG Interpretation  Date/Time:  Wednesday November 18 2018 01:13:35 EDT Ventricular Rate:  59 PR Interval:    QRS Duration: 90 QT Interval:  458 QTC Calculation: 454 R Axis:   72 Text Interpretation:  Sinus rhythm Borderline T abnormalities, anterior leads improved rate Otherwise no significant change Confirmed by Addison Lank ([id number]) on 11/18/2018 3:08:14 AM   Radiology No results found.  Procedures Procedures (including critical care time)  Medications Ordered in ED Medications  sodium chloride 0.9 % bolus  500 mL (has no administration in time range)  ziprasidone (GEODON) injection 10 mg (10 mg Intramuscular Given 11/18/18 0136)  sterile water (preservative free) injection (1.2 mLs  Given 11/18/18 0137)     Initial Impression / Assessment and Plan / ED Course  I have reviewed the triage vital signs and the nursing notes.  Pertinent labs & imaging results that were available during my care of the patient were reviewed by me and considered in my medical decision making (see chart for details).    UDS positive for opiates and THC Beta-hCG negative Tylenol negative Salicylate negative Ethanol 69 CBC nonacute, hemoglobin 15.6, suspect hemoconcentration CMP nonacute EKG:  Sinus rhythm Borderline T abnormalities, anterior leads improved rate Otherwise no significant change Confirmed by Addison Lank [phone]) on 11/18/2018 3:08:14 AM - Suspect patient possible syncopal episode earlier secondary to dehydration/orthostasis.  Low suspicion for acute cardiopulmonary process or other emergent pathologies at this time. She has been given fluid bolus with improvement of tachycardia.  Shortly after receiving fluid bolus patient became agitated with staff and was given Geodon.  She has been resting comfortably since that time.  On reevaluation patient sleeping, easily arousable to voice she is calm and cooperative, fully alert and oriented and in no acute distress.  She denies any pain, she still will not discuss with me the events leading to her attempted suicide today.  Vital signs have remained stable.  At this time there does not appear to be any evidence of an acute emergency medical condition and the patient is medically cleared for psychiatric evaluation.  Patient's case discussed with Dr. Leonette Monarch during this visit who agrees with medical clearance at this time and psychiatric evaluation.  Note: Portions of this report may have been transcribed using voice recognition software. Every effort was made to  ensure accuracy; however, inadvertent computerized transcription errors may still be  present. Final Clinical Impressions(s) / ED Diagnoses   Final diagnoses:  Suicide attempt by heroin overdose [facility](HCC)    ED Discharge Orders    None       Elizabeth PalauMorelli, Brandon A, PA-C 11/18/18 0426    Nira Connardama, Pedro Eduardo, MD 11/20/18 [id number]

## 2018-11-18 NOTE — BHH Counselor (Signed)
Dr. Mariea Clonts and Delphia Grates, NP, recommend inpatient treatment

## 2018-11-18 NOTE — BHH Counselor (Signed)
Have attempted to reach to begin TTS several times without success.

## 2018-11-18 NOTE — Progress Notes (Signed)
Received Kathryn Medina asleep in her bed with the sitter at the bedside. Her boyfriend was allowed to visit briefly. She got upset and wanted to leave after he refused to bring her an item back to the hospital. She was redirected and security was notified. Her boyfriend's behavior was appropriate throughout the visit. Later she was medicated with Catapres per order. She was extremely drowsy and difficult to arouse in the late Pm and early AM. She slept at intervals throughout the night.

## 2018-11-18 NOTE — ED Notes (Addendum)
Pt got out of her chair in triage room despite multiple staff asking pt to not get up. Pt was found on the floor after saying she "passed out" Staff assisted pt back up into chair. Provider at bedside

## 2018-11-18 NOTE — ED Notes (Signed)
Pt states that she is ready to go home. Pt made aware that she is IVC at this time, and needs to speak with council to be evaluated. Pt verbalized understanding, pt was offered meal.  Pt was able to ambulated to bathroom independently.

## 2018-11-19 ENCOUNTER — Other Ambulatory Visit: Payer: Self-pay

## 2018-11-19 ENCOUNTER — Encounter (HOSPITAL_COMMUNITY): Payer: Self-pay

## 2018-11-19 ENCOUNTER — Inpatient Hospital Stay (HOSPITAL_COMMUNITY)
Admission: AD | Admit: 2018-11-19 | Discharge: 2018-11-22 | DRG: 897 | Disposition: A | Discharge status: Home / Self Care | Payer: Federal, State, Local not specified - Other | Source: Intra-hospital | Attending: Psychiatry | Admitting: Psychiatry

## 2018-11-19 DIAGNOSIS — Z91018 Allergy to other foods: Secondary | ICD-10-CM | POA: Diagnosis not present

## 2018-11-19 DIAGNOSIS — G4700 Insomnia, unspecified: Secondary | ICD-10-CM | POA: Diagnosis present

## 2018-11-19 DIAGNOSIS — F17210 Nicotine dependence, cigarettes, uncomplicated: Secondary | ICD-10-CM | POA: Diagnosis present

## 2018-11-19 DIAGNOSIS — F1210 Cannabis abuse, uncomplicated: Secondary | ICD-10-CM | POA: Diagnosis present

## 2018-11-19 DIAGNOSIS — Z560 Unemployment, unspecified: Secondary | ICD-10-CM | POA: Diagnosis not present

## 2018-11-19 DIAGNOSIS — R45851 Suicidal ideations: Secondary | ICD-10-CM | POA: Diagnosis present

## 2018-11-19 DIAGNOSIS — F1124 Opioid dependence with opioid-induced mood disorder: Principal | ICD-10-CM | POA: Diagnosis present

## 2018-11-19 DIAGNOSIS — F410 Panic disorder [episodic paroxysmal anxiety] without agoraphobia: Secondary | ICD-10-CM | POA: Diagnosis present

## 2018-11-19 DIAGNOSIS — Z8249 Family history of ischemic heart disease and other diseases of the circulatory system: Secondary | ICD-10-CM | POA: Diagnosis not present

## 2018-11-19 DIAGNOSIS — Z825 Family history of asthma and other chronic lower respiratory diseases: Secondary | ICD-10-CM

## 2018-11-19 DIAGNOSIS — F1123 Opioid dependence with withdrawal: Secondary | ICD-10-CM | POA: Diagnosis present

## 2018-11-19 DIAGNOSIS — F1024 Alcohol dependence with alcohol-induced mood disorder: Secondary | ICD-10-CM | POA: Diagnosis present

## 2018-11-19 DIAGNOSIS — F329 Major depressive disorder, single episode, unspecified: Secondary | ICD-10-CM | POA: Diagnosis present

## 2018-11-19 DIAGNOSIS — F1094 Alcohol use, unspecified with alcohol-induced mood disorder: Secondary | ICD-10-CM | POA: Diagnosis not present

## 2018-11-19 MED ORDER — METHOCARBAMOL 500 MG PO TABS
500.0000 mg | ORAL_TABLET | Freq: Three times a day (TID) | ORAL | Status: DC | PRN
  Administered 2018-11-19 – 2018-11-21 (×7): 500 mg via ORAL
  Filled 2018-11-19 (×8): qty 1

## 2018-11-19 MED ORDER — HYDROXYZINE HCL 25 MG PO TABS
25.0000 mg | ORAL_TABLET | Freq: Four times a day (QID) | ORAL | Status: DC | PRN
  Administered 2018-11-20 – 2018-11-21 (×4): 25 mg via ORAL
  Filled 2018-11-19 (×4): qty 1

## 2018-11-19 MED ORDER — THIAMINE HCL 100 MG/ML IJ SOLN: 100.0000 mg | Freq: Once | INTRAMUSCULAR | Status: DC

## 2018-11-19 MED ORDER — NICOTINE 14 MG/24HR TD PT24
14.0000 mg | MEDICATED_PATCH | Freq: Every day | TRANSDERMAL | Status: DC
  Administered 2018-11-19 – 2018-11-22 (×4): 14 mg via TRANSDERMAL
  Filled 2018-11-19 (×7): qty 1

## 2018-11-19 MED ORDER — ADULT MULTIVITAMIN W/MINERALS CH
1.0000 | ORAL_TABLET | Freq: Every day | ORAL | Status: DC
  Administered 2018-11-19 – 2018-11-22 (×4): 1 via ORAL
  Filled 2018-11-19 (×7): qty 1

## 2018-11-19 MED ORDER — VITAMIN B-1 100 MG PO TABS
100.0000 mg | ORAL_TABLET | Freq: Every day | ORAL | Status: DC
  Administered 2018-11-20 – 2018-11-22 (×3): 100 mg via ORAL
  Filled 2018-11-19 (×5): qty 1

## 2018-11-19 MED ORDER — DICYCLOMINE HCL 20 MG PO TABS
20.0000 mg | ORAL_TABLET | Freq: Four times a day (QID) | ORAL | Status: DC | PRN
  Administered 2018-11-19: 20 mg via ORAL
  Filled 2018-11-19 (×2): qty 1

## 2018-11-19 MED ORDER — NAPROXEN 500 MG PO TABS
500.0000 mg | ORAL_TABLET | Freq: Two times a day (BID) | ORAL | Status: DC | PRN
  Administered 2018-11-19 – 2018-11-22 (×6): 500 mg via ORAL
  Filled 2018-11-19 (×7): qty 1

## 2018-11-19 MED ORDER — ONDANSETRON 4 MG PO TBDP: 4.0000 mg | ORAL_TABLET | Freq: Four times a day (QID) | ORAL | Status: DC | PRN

## 2018-11-19 MED ORDER — TRAZODONE HCL 100 MG PO TABS
100.0000 mg | ORAL_TABLET | Freq: Every evening | ORAL | Status: DC | PRN
  Administered 2018-11-19 – 2018-11-21 (×3): 100 mg via ORAL
  Filled 2018-11-19 (×3): qty 1

## 2018-11-19 MED ORDER — LOPERAMIDE HCL 2 MG PO CAPS: 2.0000 mg | ORAL_CAPSULE | ORAL | Status: DC | PRN

## 2018-11-19 MED ORDER — LORAZEPAM 1 MG PO TABS
1.0000 mg | ORAL_TABLET | Freq: Four times a day (QID) | ORAL | Status: DC | PRN
  Administered 2018-11-19 – 2018-11-20 (×2): 1 mg via ORAL
  Filled 2018-11-19 (×2): qty 1

## 2018-11-19 NOTE — Progress Notes (Signed)
Psychoeducational Group Note  Date:  11/19/2018 Time:  2114  Group Topic/Focus:  Wrap-Up Group:   The focus of this group is to help patients review their daily goal of treatment and discuss progress on daily workbooks.  Participation Level: Did Not Attend  Participation Quality:  Not Applicable  Affect:  Not Applicable  Cognitive:  Not Applicable  Insight:  Not Applicable  Engagement in Group: Not Applicable  Additional Comments:  The patient did not attend group this evening.   Archie Balboa S 11/19/2018, 9:14 PM

## 2018-11-19 NOTE — BHH Suicide Risk Assessment (Signed)
[facility] Admission Suicide Risk Assessment   Nursing information obtained from:  Patient Demographic factors:  Caucasian, Unemployed, Adolescent or young adult, Low socioeconomic status Current Mental Status:  Suicidal ideation indicated by patient, Plan includes specific time, place, or method, Intention to act on suicide plan, Self-harm thoughts, Belief that plan would result in death, Suicide plan, Self-harm behaviors Loss Factors:  Legal issues Historical Factors:  Impulsivity Risk Reduction Factors:  Sense of responsibility to family, Positive social support, Responsible for children under 28 years of age, Living with another person, especially a relative, Positive therapeutic relationship  Total Time spent with patient: 45 minutes Principal Problem:  Opiate Dependence, Alcohol Dependence, Opiate Induced Mood Disorder  Diagnosis:  Opiate Dependence, Alcohol Dependence, Opiate Induced Mood Disorder  Subjective Data:  Continued Clinical Symptoms:  Alcohol Use Disorder Identification Test Final Score (AUDIT): 4 The "Alcohol Use Disorders Identification Test", Guidelines for Use in Primary Care, Second Edition.  World Pharmacologist [facility]). Score between 0-7:  no or low risk or alcohol related problems. Score between 8-15:  moderate risk of alcohol related problems. Score between 16-19:  high risk of alcohol related problems. Score 20 or above:  warrants further diagnostic evaluation for alcohol dependence and treatment.   CLINICAL FACTORS:  28 y old female, presented to hospital reporting opiate ( IV heroin), alcohol dependencies ( reports she has been using for about 3-4 months ) , depression ( which she reports is mainly substance induced ) and some suicidal ideations of overdosing on heroin.    Psychiatric Specialty Exam: Physical Exam  ROS  Height 5\' 8"  (1.727 m), weight 55.3 kg.Body mass index is 18.55 kg/m.  See admit note MSE   COGNITIVE FEATURES THAT CONTRIBUTE TO RISK:   Closed-mindedness and Loss of executive function    SUICIDE RISK:   Moderate:  Frequent suicidal ideation with limited intensity, and duration, some specificity in terms of plans, no associated intent, good self-control, limited dysphoria/symptomatology, some risk factors present, and identifiable protective factors, including available and accessible social support.  PLAN OF CARE: Patient will be admitted to inpatient psychiatric unit for stabilization and safety. Will provide and encourage milieu participation. Provide medication management and maked adjustments as needed. Will also provide medication management to address withdrawal symptoms as needed.   Will follow daily.    I certify that inpatient services furnished can reasonably be expected to improve the patient's condition.   Jenne Campus, MD 11/19/2018, 1:32 PM

## 2018-11-19 NOTE — Progress Notes (Signed)
Patient ID: Kathryn Medina, female   DOB: May 16, 1990, 28 y.o.   MRN: [id number] Admission Note  Pt is a 28 yo female that presents IVC'd to [facility] after an intentional overdose on heroin. Pt states she has been using heroin and drinking socially. Pt Was arrested recently after being charged with larceny. Pt states that this was her boyfriends work truck and a misunderstanding. Pt spent the night in jail and states she came to hospital right after that because she felt suicidal. Pt lives with her boyfriend and is questioning discharge back home in her assessment. Pt states she has a 28 yo, and is tearful with finding out they can't visit. Pt is anxious, fidgety, sad, sullen and guarded in her assessment. Pt's skin assessment was unremarkable, with various tattoos throughout and 1 dermal piercing on her sternum. Pt denies having a pcp or taking Rx medications. Pt denies other drug/Rx abuse/use. Pt states she smokes and was also upset because she will have to refrain while admitted. Pt states she drinks 2 drinks per day of alcohol. Pt states her boyfriend is her only support. Pt denies having a job. Pt denies past/present verbal/physical/sexual abuse. Pt denies self neglect but this Probation officer is concerned. Pt states she has lost weight recently. Pt denies si/hi/ah/vh at this time and verbally agrees to approach staff if these become apparent or before harming herself/others while at [facility].   From a previous assessment:  Kathryn Medina is an 28 y.o. female present to [facility] after an initial overdose of heroin. TTS writer seen patient with Dr. Mariea Clonts and Delphia Grates, NP. Patient report she attempted suicide via overdose of IV Heroin usage. Report she attempted suicide after she was released from jail. Report she was in jail for about 3 hours for larceny charge. Patient did not provide detail information on stressors in her life which triggered her suicide attempt. Denied suicide intent in the past. Denied past history of  inpatient psychiatric care. Denied homicidal ideations. Denied auditory / visual hallucination. Denied feelings are paranoia.   Addendum to Assessment:  Patient states that she was incarcerated on an outstanding warrant yesterday and was placed in jail.  She has been charged with [employer], music.  She states that she was in jail for several hours and released. Patient states that she is facing time if convicted of this charge.  Patient states that she is stressed out and feeling overwhelmed.  Two months ago, patient states that she lost four years of sobriety and states that she has been using a gram of heroin daily for the past two months.  Patient states that she has been using heroin since the age of 70.  Patient states that she is living with her boyfriend, who is not a drug user, and her two year old child.  She states that her relationship is strained due to her use, but states that her boyfriend is still supportive. She states that she is not currently working and finances are tight especially because of her using and not contributing to the household income.   Patient states that she is not clear of all the events that occurred yesterday, but she cannot rule out that she purposely overdosed to kill herself. She denies any prior suicide attempts and states that she has never pursued any mental health or substance abuse treatment. Patient states that she has never been homicidal and denies any history of psychosis.  Patient states that she is currently sleeping less than  five hours a night and she states that she feels tired all of the time.  She states that she has experienced a loss of appetite and when asked if she has lost weight, she states, "probably."  Patient was very drowsy during her assessment process and did not provide much detailed information.  Consents signed, skin/belongings search completed and patient oriented to unit. Patient stable at this time. Patient given the  opportunity to express concerns and ask questions. Patient given toiletries. Will continue to monitor.

## 2018-11-19 NOTE — BHH Group Notes (Signed)
Type of Therapy and Topic:  Group Therapy:  Trust and Honesty 11/19/2018  Participation Level:  Did Not Attend   Description of Group:     In this group patients will be asked to explore the value of being honest.  Patients will be guided to discuss their thoughts, feelings, and behaviors related to honesty and trusting in others. Patients will process together how trust and honesty relate to forming relationships with peers, family members, and self. Each patient will be challenged to identify and express feelings of being vulnerable. Patients will discuss reasons why people are dishonest and identify alternative outcomes if one was truthful (to self or others). This group will be process-oriented, with patients participating in exploration of their own experiences, giving and receiving support, and processing challenge from other group members.    Therapeutic Goals:  1.  Patient will identify why honesty is important to relationships and how honesty overall affects relationships.  2.  Patient will identify a situation where they lied or were lied too and the  feelings, thought process, and behaviors surrounding the situation  3.  Patient will identify the meaning of being vulnerable, how that feels, and how that correlates to being honest with self and others.  4.  Patient will identify situations where they could have told the truth, but instead lied and explain reasons of dishonesty.     Summary of Patient Progress:   n/a  Therapeutic Modalities:   Cognitive Behavioral Therapy Solution Focused Therapy Motivational Interviewing Brief Therapy 

## 2018-11-19 NOTE — H&P (Signed)
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 Reports she has been using daily, up to one gram of heroin per day. Last used 1-2 days ago. In addition to opiates, also endorses daily alcohol consumption, and states she had been drinking up to 5 beers per day. She also reports depression, particularly over the last 2 weeks or so. On admission endorsed SI by overdosing on heroin.  Attributes depression at least partially to substance abuse, and states her mood was better before drug use . Of note, she reports opiate abuse started about four months ago, and denies prior history of opiate or other drug abuse . Attributes onset of drug abuse to  " hanging out with people who were using", and does not currently endorse other stressors or triggers  Admission UDS positive for Opiates and Cannabis, admission BAL (+) at 69. Endorses neuro-vegetative symptoms of depression as below. Reports symptoms of opiate WDL- feels anxious, muscular aches, loose stools, lacrimation, " feeling hot and cold".    Associated Signs/Symptoms: Depression Symptoms:  depressed mood, anhedonia, insomnia, suicidal thoughts with specific plan, anxiety, loss of energy/fatigue, decreased appetite, reports she has lost about 20 lbs over the last three months (Hypo) Manic Symptoms:  Does not endorse or present with Anxiety Symptoms:  Reports occasional panic attacks and increased anxiety recently Psychotic Symptoms:  Denies  PTSD Symptoms: Denies  Total Time spent with  patient: 45 minutes  Past Psychiatric History: denies prior psychiatric admissions, denies history of suicide attempts or history of self injurious behaviors, denies history of psychosis, currently denies history of significant depression prior to onset of substance abuse, and does not endorse history of mania or hypomania. Does not endorse history of PTSD, denies panic,agoraphobia or excessive worrying . Denies history of violence .  Is the patient at risk to self? Yes.    Has the patient been a risk to self in the past 6 months? Yes.    Has the patient been a risk to self within the distant past? No.  Is the patient a risk to others? No.  Has the patient been a risk to others in the past 6 months? No.  Has the patient been a risk to others within the distant past? No.   Prior Inpatient Therapy:  denies  Prior Outpatient Therapy:  denies   Alcohol Screening:   Substance Abuse History in the last 12 months:  IV opiate ( heroin) use disorder as above, which she reports started about three months ago, also endorses daily alcohol use, up to several beers per day. She denies other drug abuse history, other than cannabis abuse in the past. Denies alcohol or BZD abuse  Consequences of Substance Abuse: Does not endorse history of blackouts or of seizures, denies history of DUIs Previous Psychotropic Medications: reports she was not taking any medications prior to admission, states she has never been on any psychiatric medications in the past  Psychological Evaluations:  No  Past Medical History: denies medical illnesses . NKDA. Past Medical History:  Diagnosis Date  . Asthma  does not use inhaler    Past Surgical History:  Procedure Laterality Date  . DILATION AND EVACUATION  03/22/2012   Procedure: DILATATION AND EVACUATION;  Surgeon: Brock Bad, MD;  Location: [facility];  Service: Gynecology;  Laterality: N/A;   Family History:  Parents alive. No biological siblings . Family History   Problem Relation Age of Onset  . Cancer Paternal Grandfather   . Cancer Paternal Grandmother   . Emphysema Maternal Grandmother   . Hypertension Father   . Cancer Father    Family Psychiatric  History: denies history of mental illness in family, no history of suicides in family, denies history of alcohol or substance abuse in family Tobacco Screening:  smokes 1 PPD  Social History: 16, single, unemployed , lives with BF, has a 39 year old daughter, currently with the child's grandmother Social History   Substance and Sexual Activity  Alcohol Use No   Comment: social     Social History   Substance and Sexual Activity  Drug Use Yes  . Types: Oxycodone, Marijuana   Comment: heroin     Additional Social History:  Allergies:   Allergies  Allergen Reactions  . Other Hives    pork   Lab Results:  Results for orders placed or performed during the hospital encounter of 11/17/18 (from the past 48 hour(s))  Comprehensive metabolic panel     Status: Abnormal   Collection Time: 11/18/18 12:21 AM  Result Value Ref Range   Sodium 139 135 - 145 mmol/L   Potassium 4.6 3.5 - 5.1 mmol/L   Chloride 104 98 - 111 mmol/L   CO2 23 22 - 32 mmol/L   Glucose, Bld 77 70 - 99 mg/dL   BUN 7 6 - 20 mg/dL   Creatinine, Ser 0.98 0.44 - 1.00 mg/dL   Calcium 9.5 8.9 - 11.9 mg/dL   Total Protein 8.2 (H) 6.5 - 8.1 g/dL   Albumin 3.9 3.5 - 5.0 g/dL   AST 33 15 - 41 U/L   ALT 26 0 - 44 U/L   Alkaline Phosphatase 96 38 - 126 U/L   Total Bilirubin 0.4 0.3 - 1.2 mg/dL   GFR calc non Af Amer >60 >60 mL/min   GFR calc Af Amer >60 >60 mL/min   Anion gap 12 5 - 15    Comment: Performed at [facility], [address] [place], [place] [address]  Ethanol     Status: Abnormal   Collection Time: 11/18/18 12:21 AM  Result Value Ref Range   Alcohol, Ethyl (B) 69 (H) <10 mg/dL    Comment: (NOTE) Lowest detectable limit for serum alcohol is 10 mg/dL. For medical purposes only. Performed  at [facility], [address], [place], [place] [address]   Salicylate level     Status: None   Collection Time: 11/18/18 12:21 AM  Result Value Ref Range   Salicylate Lvl <7.0 2.8 - 30.0 mg/dL    Comment: Performed at [facility], [address], [place], [place] [address]  Acetaminophen level     Status: Abnormal   Collection Time: 11/18/18 12:21 AM  Result Value Ref Range   Acetaminophen (Tylenol), Serum <10 (L) 10 - 30 ug/mL    Comment: (NOTE) Therapeutic concentrations vary significantly. A range of 10-30 ug/mL  may be an effective concentration for many patients. However, some  are best treated at concentrations outside of this range. Acetaminophen concentrations >150 ug/mL at 4 hours after  ingestion  and >50 ug/mL at 12 hours after ingestion are often associated with  toxic reactions. Performed at [facility], [place] [address] [place], [place] [address]   cbc     Status: Abnormal   Collection Time: 11/18/18 12:21 AM  Result Value Ref Range   WBC 9.6 4.0 - 10.5 K/uL   RBC 4.71 3.87 - 5.11 MIL/uL   Hemoglobin 15.6 (H) 12.0 - 15.0 g/dL   HCT 45.2 36.0 - 46.0 %   MCV 96.0 80.0 - 100.0 fL   MCH 33.1 26.0 - 34.0 pg   MCHC 34.5 30.0 - 36.0 g/dL   RDW 13.2 11.5 - 15.5 %   Platelets 256 150 - 400 K/uL   nRBC 0.0 0.0 - 0.2 %    Comment: Performed at [facility], [place] [address] [place], [place] [address]  I-Stat beta hCG blood, ED     Status: None   Collection Time: 11/18/18 12:28 AM  Result Value Ref Range   I-stat hCG, quantitative <5.0 <5 mIU/mL   Comment 3            Comment:   GEST. AGE      CONC.  (mIU/mL)   <=1 WEEK        5 - 50     2 WEEKS       50 - 500     3 WEEKS       100 - 10,000     4 WEEKS     1,000 - 30,000        FEMALE AND NON-PREGNANT FEMALE:     LESS THAN 5 mIU/mL   Rapid urine drug screen (hospital performed)     Status: Abnormal   Collection Time: 11/18/18  1:30 AM   Result Value Ref Range   Opiates POSITIVE (A) NONE DETECTED   Cocaine NONE DETECTED NONE DETECTED   Benzodiazepines NONE DETECTED NONE DETECTED   Amphetamines NONE DETECTED NONE DETECTED   Tetrahydrocannabinol POSITIVE (A) NONE DETECTED   Barbiturates NONE DETECTED NONE DETECTED    Comment: (NOTE) DRUG SCREEN FOR MEDICAL PURPOSES ONLY.  IF CONFIRMATION IS NEEDED FOR ANY PURPOSE, NOTIFY LAB WITHIN 5 DAYS. LOWEST DETECTABLE LIMITS FOR URINE DRUG SCREEN Drug Class                     Cutoff (ng/mL) Amphetamine and metabolites    1000 Barbiturate and metabolites    200 Benzodiazepine                 010 Tricyclics and metabolites     300 Opiates and metabolites        300 Cocaine and metabolites        300 THC                            50 Performed at [facility], [place] [address] [place], [place] [address]   SARS Coronavirus 2 [facility] order, Performed in [facility] hospital lab) Nasopharyngeal Nasopharyngeal Swab     Status: None   Collection Time: 11/18/18  3:10 PM   Specimen: Nasopharyngeal Swab  Result Value Ref Range   SARS Coronavirus 2 NEGATIVE NEGATIVE    Comment: (NOTE) If result is NEGATIVE SARS-CoV-2 target nucleic acids are NOT DETECTED. The SARS-CoV-2 RNA is generally detectable in upper and lower  respiratory specimens during the acute phase of infection. The lowest  concentration  of SARS-CoV-2 viral copies this assay can detect is 250  copies / mL. A negative result does not preclude SARS-CoV-2 infection  and should not be used as the sole basis for treatment or other  patient management decisions.  A negative result may occur with  improper specimen collection / handling, submission of specimen other  than nasopharyngeal swab, presence of viral mutation(s) within the  areas targeted by this assay, and inadequate number of viral copies  (<250 copies / mL). A negative result must be combined with clinical  observations, patient history,  and epidemiological information. If result is POSITIVE SARS-CoV-2 target nucleic acids are DETECTED. The SARS-CoV-2 RNA is generally detectable in upper and lower  respiratory specimens dur ing the acute phase of infection.  Positive  results are indicative of active infection with SARS-CoV-2.  Clinical  correlation with patient history and other diagnostic information is  necessary to determine patient infection status.  Positive results do  not rule out bacterial infection or co-infection with other viruses. If result is PRESUMPTIVE POSTIVE SARS-CoV-2 nucleic acids MAY BE PRESENT.   A presumptive positive result was obtained on the submitted specimen  and confirmed on repeat testing.  While 2019 novel coronavirus  (SARS-CoV-2) nucleic acids may be present in the submitted sample  additional confirmatory testing may be necessary for epidemiological  and / or clinical management purposes  to differentiate between  SARS-CoV-2 and other Sarbecovirus currently known to infect humans.  If clinically indicated additional testing with an alternate test  methodology [phone]) is advised. The SARS-CoV-2 RNA is generally  detectable in upper and lower respiratory sp ecimens during the acute  phase of infection. The expected result is Negative. Fact Sheet for Patients:  BoilerBrush.com.cy Fact Sheet for Healthcare Providers: https://pope.com/ This test is not yet approved or cleared by the [place] FDA and has been authorized for detection and/or diagnosis of SARS-CoV-2 by FDA under an Emergency Use Authorization (EUA).  This EUA will remain in effect (meaning this test can be used) for the duration of the COVID-19 declaration under Section 564(b)(1) of the Act, 21 U.S.C. section 360bbb-3(b)(1), unless the authorization is terminated or revoked sooner. Performed at [facility], [address], [place], [place]  [address]     Blood Alcohol level:  Lab Results  Component Value Date   ETH 69 (H) 11/18/2018   ETH <11 02/16/2013    Metabolic Disorder Labs:  Lab Results  Component Value Date   HGBA1C 4.9 02/20/2016   No results found for: PROLACTIN No results found for: CHOL, TRIG, HDL, CHOLHDL, VLDL, LDLCALC  Current Medications: No current facility-administered medications for this encounter.    PTA Medications: Medications Prior to Admission  Medication Sig Dispense Refill Last Dose  . HYDROcodone-acetaminophen (NORCO/VICODIN) 5-325 MG tablet Take 1 tablet by mouth every 6 (six) hours as needed for moderate pain or severe pain. (Patient not taking: Reported on 11/18/2018) 8 tablet 0   . ibuprofen (ADVIL,MOTRIN) 600 MG tablet Take 1 tablet (600 mg total) by mouth every 6 (six) hours. (Patient not taking: Reported on 01/14/2017) 30 tablet 0   . ibuprofen (ADVIL,MOTRIN) 800 MG tablet Take 1 tablet (800 mg total) by mouth every 8 (eight) hours as needed. (Patient not taking: Reported on 01/14/2017) 30 tablet 5   . medroxyPROGESTERone (DEPO-PROVERA) 150 MG/ML injection Inject 1 mL (150 mg total) into the muscle every 3 (three) months. (Patient not taking: Reported on 04/04/2018) 1 mL 4   . ondansetron (ZOFRAN  ODT) 4 MG disintegrating tablet Take 1 tablet (4 mg total) by mouth every 8 (eight) hours as needed for nausea or vomiting. (Patient not taking: Reported on 04/04/2018) 20 tablet 0   . prenatal vitamin w/FE, FA (PRENATAL 1 + 1) 27-1 MG TABS tablet Take 1 tablet by mouth daily at 12 noon. (Patient not taking: Reported on 04/04/2018) 30 each 6   . senna-docusate (SENOKOT-S) 8.6-50 MG tablet Take 1 tablet by mouth at bedtime as needed for mild constipation. (Patient not taking: Reported on 01/14/2017) 30 tablet 0     Musculoskeletal: Strength & Muscle Tone: within normal limits ( +) tremors and mildly restless Gait & Station: normal Patient leans: N/A  Psychiatric Specialty Exam: Physical Exam   Review of Systems  Constitutional: Positive for chills and weight loss. Negative for fever.  HENT: Negative.        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   Respiratory: Negative.  Negative for cough and shortness of breath.   Cardiovascular: Negative.  Negative for chest pain.  Gastrointestinal: Positive for diarrhea and nausea.  Genitourinary: Negative.   Musculoskeletal: Positive for myalgias.  Skin: Negative for rash.  Neurological: Positive for headaches. Negative for seizures.  Endo/Heme/Allergies: Negative.   Psychiatric/Behavioral: Positive for depression, substance abuse and suicidal ideas.  All other systems reviewed and are negative.   There were no vitals taken for this visit.There is no height or weight on file to calculate BMI.  General Appearance: Fairly Groomed  Eye Contact:  Good  Speech:  Normal Rate  Volume:  Normal  Mood:  Depressed and Dysphoric  Affect:  Congruent  Thought Process:  Linear and Descriptions of Associations: Intact  Orientation:  Full (Time, Place, and Person)  Thought Content:  no hallucinations, no delusions, not internally preoccupied   Suicidal Thoughts:  No denies suicidal or self injurious ideations, denies homicidal or violent ideations, contracts for safety on unit  Homicidal Thoughts:  No  Memory:  recent and remote grossly intact   Judgement:  Fair  Insight:  Fair  Psychomotor Activity:  mild distal tremors  Concentration:  Concentration: Fair and Attention Span: Fair  Recall:  Good  Fund of Knowledge:  Good  Language:  Good  Akathisia:  Negative  Handed:  Left  AIMS (if indicated):     Assets:  Desire for Improvement Resilience  ADL's:  Intact  Cognition:  WNL  Sleep:       Treatment Plan Summary: Daily contact with patient to assess and evaluate symptoms and progress in treatment, Medication management, Plan inpatient treatment and medications as below  Observation Level/Precautions:  15 minute  checks  Laboratory:  as needed   Psychotherapy:  Milieu, group therapy  Medications:  Opiate detox protocol with Clonidine and medications for symptomatic relief. Alcohol detox protocol with Ativan PRN for alcohol WDL if needed . Will monitor mood, and start an antidepressant if mood does not promptly improve with abstinence  Consultations:  As needed   Discharge Concerns:  -   Estimated LOS: 4 days   Other:     Physician Treatment Plan for Primary Diagnosis:  Opiate Use Disorder, Alcohol Use Disorder, Substance Induced Mood Disorder versus MDD Long Term Goal(s): Improvement in symptoms so as ready for discharge  Short Term Goals: Ability to identify changes in lifestyle to reduce recurrence of condition will improve, Ability to verbalize feelings will improve, Ability to disclose and discuss suicidal ideas, Ability to demonstrate self-control will improve, Ability to  identify and develop effective coping behaviors will improve and Ability to maintain clinical measurements within normal limits will improve  Physician Treatment Plan for Secondary Diagnosis: Opiate Use Disorder, Opiate WDL  Long Term Goal(s): Improvement in symptoms so as ready for discharge  Short Term Goals: Ability to identify changes in lifestyle to reduce recurrence of condition will improve and Ability to identify triggers associated with substance abuse/mental health issues will improve  I certify that inpatient services furnished can reasonably be expected to improve the patient's condition.    Craige CottaFernando A , MD 8/20/202012:54 PM

## 2018-11-19 NOTE — Progress Notes (Signed)
Pt accepted to [facility]   Dr. Parke Poisson is the attending provider.    Call report to [phone]    Pt may arrive as soon as transportation is arranged.  Audree Camel, LCSW, [place] Disposition [facility]/TTS [phone]

## 2018-11-19 NOTE — Tx Team (Signed)
Initial Treatment Plan 11/19/2018 1:11 PM [name] [id number]    PATIENT STRESSORS: Health problems Medication change or noncompliance Substance abuse Traumatic event   PATIENT STRENGTHS: Average or above average intelligence General fund of knowledge Physical Health Supportive family/friends   PATIENT IDENTIFIED PROBLEMS: "depression"  "substance abuse"  "anxiety"                 DISCHARGE CRITERIA:  Ability to meet basic life and health needs Adequate post-discharge living arrangements Improved stabilization in mood, thinking, and/or behavior Medical problems require only outpatient monitoring  PRELIMINARY DISCHARGE PLAN: Attend 12-step recovery group Participate in family therapy Return to previous living arrangement  PATIENT/FAMILY INVOLVEMENT: This treatment plan has been presented to and reviewed with the patient, Kathryn Medina.  The patient and family have been given the opportunity to ask questions and make suggestions.  Baron Sane, RN 11/19/2018, 1:11 PM

## 2018-11-20 MED ORDER — CLONIDINE HCL 0.1 MG PO TABS
0.1000 mg | ORAL_TABLET | Freq: Every day | ORAL | Status: DC
  Filled 2018-11-20: qty 1

## 2018-11-20 MED ORDER — CLONIDINE HCL 0.1 MG PO TABS: 0.1000 mg | ORAL_TABLET | Freq: Four times a day (QID) | ORAL | Status: DC

## 2018-11-20 MED ORDER — CLONIDINE HCL 0.1 MG PO TABS: 0.1000 mg | ORAL_TABLET | Freq: Every day | ORAL | Status: DC

## 2018-11-20 MED ORDER — CLONIDINE HCL 0.1 MG PO TABS
0.1000 mg | ORAL_TABLET | ORAL | Status: DC
  Administered 2018-11-22: 08:00:00 0.1 mg via ORAL
  Filled 2018-11-20 (×4): qty 1

## 2018-11-20 MED ORDER — CLONIDINE HCL 0.1 MG PO TABS
0.1000 mg | ORAL_TABLET | Freq: Three times a day (TID) | ORAL | Status: AC
  Administered 2018-11-20 – 2018-11-21 (×3): 0.1 mg via ORAL
  Filled 2018-11-20 (×4): qty 1

## 2018-11-20 MED ORDER — CLONIDINE HCL 0.1 MG PO TABS: 0.1000 mg | ORAL_TABLET | ORAL | Status: DC

## 2018-11-20 NOTE — [facility] Group Notes (Signed)
11/20/2018 8:45am Type of Group and Topic: Psychoeducational Group: Discharge Planning  Participation Level: Active  Description of Group Discharge planning group reviews patient's anticipated discharge plans and assists patients to anticipate and address any barriers to wellness/recovery in the community. Suicide prevention education is reviewed with patients in group. Therapeutic Goals 1. Patients will state their anticipated discharge plan and mental health aftercare 2. Patients will identify potential barriers to wellness in the community setting 3. Patients will engage in problem solving, solution focused discussion of ways to anticipate and address barriers to wellness/recovery   Summary of Patient Progress Plan for Discharge/Comments:  Kathryn Medina reports that she plans to discharge home with her husband and two children. Braley agreed to follow up with [facility] in [place] for medication management services. [name] states that she plans to "get her life together".    Transportation Means:  She reports her husband or her mother will pick her up at discharge.    Supports:  Husband, mother, step-father  Therapeutic Modalities: Motivational Interviewing     Radonna Ricker, MSW, [place] Worker [facility]  Phone: [phone] 11/20/2018 2:01 PM

## 2018-11-20 NOTE — Progress Notes (Signed)
Patient shares with CSW that her mother suffered a heart attack and is currently at [facility]. She states it is imperative that she discharges tomorrow.  CSW aware that patient is a tentative discharge for tomorrow and reminded patient that psychiatry will meet with her again tomorrow to finalize a discharge plan.   Stephanie Acre, LCSW-A Clinical Social Worker

## 2018-11-20 NOTE — Progress Notes (Signed)
[facility] MD Progress Note  11/20/2018 12:32 PM Kathryn Medina  MRN:  [id number] Subjective: Patient continues to report some symptoms of opiate withdrawal but acknowledges she is feeling better today.  Denies suicidal ideations at this time and presents future oriented, states she hopes to be discharged by Sunday as does not want to miss her daughter's birthday. Objective: I have reviewed chart notes and I met with patient. 2 y old female, presented to hospital reporting opiate ( IV heroin), alcohol dependencies ( reports she has been using for about 3-4 months ) , depression ( which she reports is mainly substance induced ) and some suicidal ideations of overdosing on heroin. Currently patient is alert, attentive, does not appear to be in any acute distress or discomfort.  Remains vaguely anxious but improved compared to initial presentation and affect is noted to be more reactive. Describes lingering symptoms of withdrawal, mainly cramping/myalgias and nausea but acknowledges partial improvement.  Denies vomiting.  No current distal tremors or diaphoresis noted.  Does not appear to be in any acute distress or discomfort.  BP 110/82, pulse 80. No disruptive or agitated behaviors on unit, some group participation.  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 Asthma    does not use inhaler    Past Surgical History:  Procedure Laterality Date  . DILATION AND EVACUATION  03/22/2012   Procedure: DILATATION AND EVACUATION;  Surgeon: Shelly Bombard, MD;  Location: [facility];  Service: Gynecology;  Laterality: N/A;   Family History:  Family History  Problem Relation Age of Onset  . Cancer Paternal Grandfather   . Cancer Paternal Grandmother   . Emphysema Maternal Grandmother   . Hypertension Father   . Cancer  Father    Family Psychiatric  History:  Social History:  Social History   Substance and Sexual Activity  Alcohol Use Yes   Comment: social, 2 drinks/day     Social History   Substance and Sexual Activity  Drug Use Yes  . Types: Oxycodone, Marijuana, Heroin    Social History   Socioeconomic History  . Marital status: Single    Spouse name: Not on file  . Number of children: Not on file  . Years of education: Not on file  . Highest education level: Not on file  Occupational History  . Not on file  Social Needs  . Financial resource strain: Not on file  . Food insecurity    Worry: Not on file    Inability: Not on file  . Transportation needs    Medical: Not on file    Non-medical: Not on file  Tobacco Use  . Smoking status: Current Every Day Smoker    Packs/day: 0.50    Years: 10.00    Pack years: 5.00    Types: Cigarettes  . Smokeless tobacco: Never Used  . Tobacco comment: pt is trying to use the electronic cigarette  Substance and Sexual Activity  . Alcohol use: Yes    Comment: social, 2 drinks/day  . Drug use: Yes    Types: Oxycodone, Marijuana, Heroin  . Sexual activity: Yes    Partners: Male    Birth control/protection: None  Lifestyle  . Physical activity    Days per week: Not on file    Minutes per session: Not on file  .  Stress: Not on file  Relationships  . Social Herbalist on phone: Not on file    Gets together: Not on file    Attends religious service: Not on file    Active member of club or organization: Not on file    Attends meetings of clubs or organizations: Not on file    Relationship status: Not on file  Other Topics Concern  . Not on file  Social History Narrative  . Not on file   Additional Social History:   Sleep: Fair  Appetite:  Fair/improving  Current Medications: Current Facility-Administered Medications  Medication Dose Route Frequency Provider Last Rate Last Dose  . dicyclomine (BENTYL) tablet 20 mg  20  mg Oral Q6H PRN , Myer Peer, MD   20 mg at 11/19/18 1723  . hydrOXYzine (ATARAX/VISTARIL) tablet 25 mg  25 mg Oral Q6H PRN , Myer Peer, MD   25 mg at 11/20/18 1126  . loperamide (IMODIUM) capsule 2-4 mg  2-4 mg Oral PRN , Myer Peer, MD      . LORazepam (ATIVAN) tablet 1 mg  1 mg Oral Q6H PRN , Myer Peer, MD   1 mg at 11/20/18 0814  . methocarbamol (ROBAXIN) tablet 500 mg  500 mg Oral Q8H PRN , Myer Peer, MD   500 mg at 11/20/18 0524  . multivitamin with minerals tablet 1 tablet  1 tablet Oral Daily , Myer Peer, MD   1 tablet at 11/20/18 0815  . naproxen (NAPROSYN) tablet 500 mg  500 mg Oral BID PRN , Myer Peer, MD   500 mg at 11/20/18 1129  . nicotine (NICODERM CQ - dosed in mg/24 hours) patch 14 mg  14 mg Transdermal Daily , Myer Peer, MD   14 mg at 11/20/18 0813  . ondansetron (ZOFRAN-ODT) disintegrating tablet 4 mg  4 mg Oral Q6H PRN , Myer Peer, MD      . thiamine (B-1) injection 100 mg  100 mg Intramuscular Once ,  A, MD      . thiamine (VITAMIN B-1) tablet 100 mg  100 mg Oral Daily , Myer Peer, MD   100 mg at 11/20/18 0815  . traZODone (DESYREL) tablet 100 mg  100 mg Oral QHS PRN Deloria Lair, NP   100 mg at 11/19/18 2229    Lab Results:  Results for orders placed or performed during the hospital encounter of 11/17/18 (from the past 48 hour(s))  SARS Coronavirus 2 [facility] order, Performed in [facility] hospital lab) Nasopharyngeal Nasopharyngeal Swab     Status: None   Collection Time: 11/18/18  3:10 PM   Specimen: Nasopharyngeal Swab  Result Value Ref Range   SARS Coronavirus 2 NEGATIVE NEGATIVE    Comment: (NOTE) If result is NEGATIVE SARS-CoV-2 target nucleic acids are NOT DETECTED. The SARS-CoV-2 RNA is generally detectable in upper and lower  respiratory specimens during the acute phase of infection. The lowest  concentration of SARS-CoV-2 viral copies this assay can detect is 250  copies / mL. A  negative result does not preclude SARS-CoV-2 infection  and should not be used as the sole basis for treatment or other  patient management decisions.  A negative result may occur with  improper specimen collection / handling, submission of specimen other  than nasopharyngeal swab, presence of viral mutation(s) within the  areas targeted by this assay, and inadequate number of viral copies  (<250 copies / mL). A negative result must be combined with clinical  observations, patient history, and epidemiological information. If result is POSITIVE SARS-CoV-2 target nucleic acids are DETECTED. The SARS-CoV-2 RNA is generally detectable in upper and lower  respiratory specimens dur ing the acute phase of infection.  Positive  results are indicative of active infection with SARS-CoV-2.  Clinical  correlation with patient history and other diagnostic information is  necessary to determine patient infection status.  Positive results do  not rule out bacterial infection or co-infection with other viruses. If result is PRESUMPTIVE POSTIVE SARS-CoV-2 nucleic acids MAY BE PRESENT.   A presumptive positive result was obtained on the submitted specimen  and confirmed on repeat testing.  While 2019 novel coronavirus  (SARS-CoV-2) nucleic acids may be present in the submitted sample  additional confirmatory testing may be necessary for epidemiological  and / or clinical management purposes  to differentiate between  SARS-CoV-2 and other Sarbecovirus currently known to infect humans.  If clinically indicated additional testing with an alternate test  methodology [phone]) is advised. The SARS-CoV-2 RNA is generally  detectable in upper and lower respiratory sp ecimens during the acute  phase of infection. The expected result is Negative. Fact Sheet for Patients:  StrictlyIdeas.no Fact Sheet for Healthcare Providers: BankingDealers.co.za This test is not  yet approved or cleared by the [place] FDA and has been authorized for detection and/or diagnosis of SARS-CoV-2 by FDA under an Emergency Use Authorization (EUA).  This EUA will remain in effect (meaning this test can be used) for the duration of the COVID-19 declaration under Section 564(b)(1) of the Act, 21 U.S.C. section 360bbb-3(b)(1), unless the authorization is terminated or revoked sooner. Performed at [facility], [place] [address], [place], [place] [address]     Blood Alcohol level:  Lab Results  Component Value Date   ETH 69 (H) 11/18/2018   ETH <11 41/96/2229    Metabolic Disorder Labs: Lab Results  Component Value Date   HGBA1C 4.9 02/20/2016   No results found for: PROLACTIN No results found for: CHOL, TRIG, HDL, CHOLHDL, VLDL, LDLCALC  Physical Findings: AIMS: Facial and Oral Movements Muscles of Facial Expression: None, normal Lips and Perioral Area: None, normal Jaw: None, normal Tongue: None, normal,Extremity Movements Upper (arms, wrists, hands, fingers): None, normal Lower (legs, knees, ankles, toes): None, normal, Trunk Movements Neck, shoulders, hips: None, normal, Overall Severity Severity of abnormal movements (highest score from questions above): None, normal Incapacitation due to abnormal movements: None, normal Patient's awareness of abnormal movements (rate only patient's report): No Awareness, Dental Status Current problems with teeth and/or dentures?: No Does patient usually wear dentures?: No  CIWA:  CIWA-Ar Total: 1 COWS:  COWS Total Score: 3  Musculoskeletal: Strength & Muscle Tone: within normal limits-no tremors or diaphoresis Gait & Station: normal Patient leans: N/A  Psychiatric Specialty Exam: Physical Exam  ROS endorses lingering cramping, myalgias, nausea  Blood pressure 110/82, pulse 80, temperature 97.9 F (36.6 C), temperature source Oral, resp. rate 16, height 5' 8"  (1.727 m), weight 55.3 kg, SpO2  99 %.Body mass index is 18.55 kg/m.  General Appearance: Improved grooming  Eye Contact:  Improved eye contact  Speech:  Normal Rate  Volume:  Normal  Mood:  Less depressed  Affect:  Still vaguely anxious but overall improved, smiles at times appropriately during session  Thought Process:  Linear and Descriptions of Associations: Intact  Orientation:  Full (Time, Place, and Person)  Thought Content:  Denies hallucinations , no delusions expressed, currently does not appear internally preoccupied  Suicidal  Thoughts:  No currently denies suicidal ideations and contracts for safety on unit  Homicidal Thoughts:  No  Memory:  Recent and remote grossly intact  Judgement:  Other:  Fair/improving  Insight:  Fair  Psychomotor Activity:  Normal-no restlessness or agitation at this time, no distal tremors noted  Concentration:  Concentration: Improving and Attention Span: Improving  Recall:  Good  Fund of Knowledge:  Good  Language:  Good  Akathisia:  Negative  Handed:  Right  AIMS (if indicated):     Assets:  Communication Skills Desire for Improvement Resilience  ADL's:  Intact  Cognition:  WNL  Sleep:      Assessment: 24 y old female, presented to hospital reporting opiate ( IV heroin), alcohol dependencies ( reports she has been using for about 3-4 months ) , depression ( which she reports is mainly substance induced ) and some suicidal ideations of overdosing on heroin.  Currently patient presents with partially improved mood/range of affect, although remains vaguely anxious.  Today denies SI.  Describes lingering/persistent symptoms of opiate withdrawal but these appear to be improving compared to yesterday.  Currently does not appear to be in any acute distress.  No significant alcohol withdrawal symptoms and vitals are currently stable.  At this time she is future oriented and states she plans to go live with her mother after discharge.  Not currently interested in rehab.   Treatment  Plan Summary: Daily contact with patient to assess and evaluate symptoms and progress in treatment, Medication management, Plan Inpatient treatment and Medications as below Encourage group and milieu participation Encourage efforts to work on sobriety and relapse prevention Continue clonidine detox protocol for acute withdrawal Continue Ativan as needed for alcohol withdrawal as needed We will continue to monitor mood and affect-patient has reported she feels her depression is mainly substance-induced and today is presenting with improving mood.  We will continue to monitor and consider antidepressant if depression persists in spite of abstinence. Continue trazodone 100 mg nightly PRN for insomnia as needed Treatment team working on disposition options. Jenne Campus, MD 11/20/2018, 12:32 PM

## 2018-11-20 NOTE — Progress Notes (Signed)
DAR NOTE: Patient presents with anxious affect and mood.  Denies suicidal thoughts, auditory and visual hallucinations.  Described energy level as normal and concentration as good.  Report withdrawal symptoms of tremors, cramping, nausea, and agitation on self inventory form.  Rates depression at 8, hopelessness at 9, and anxiety at 8.  Maintained on routine safety checks.  Medications given as prescribed.  Support and encouragement offered as needed.  Attended group and participated.  States goal for today is "make it through the day the best way I can."  Patient observed socializing with peers in the dayroom.  Requested and received Naproxen and Vistaril for pain and anxiety with good effect.  Patient is safe on and off the unit.

## 2018-11-20 NOTE — B[facility] Counselor (Signed)
Adult Comprehensive Assessment  Patient ID: Kathryn Medina, female   DOB: 12/08/1990, 28 y.o.   MRN: [id number]  Information Source: Information source: Patient  Current Stressors:  Patient states their primary concerns and needs for treatment are:: "I was experimenting with drugs (heroin)" Patient states their goals for this hospitilization and ongoing recovery are:: "I want to stay postivie and clean for my daughter" Educational / Learning stressors: N/A Employment / Job issues: Unemployed Family Relationships: Denies any current Chief Technology Officerstressors Financial / Lack of resources (include bankruptcy): No income; Reports she is dependent on her boyfriend's income currently Housing / Lack of housing: Lives [place] boyfriend [place] 28 year old [place]; Denies any current stressors Physical health (include injuries & life threatening diseases): Denies any current stressors Social relationships: Reports isolating from others. She states that she has trust issues, which causes her to avoid establishing social relationships Substance abuse: Endorsed using heroin frequently. She reports "experimenting" with heroin "every, other day". States she uses at least $30 worth each use. Bereavement / Loss: Denies any currens stressors  Living/Environment/Situation:  Living Arrangements: Spouse/significant other, Children Living conditions (as described by patient or guardian): "Good" Who else lives in the home?: Boyfriend and daughter How long has patient lived in current situation?: 1 1/2 years What is atmosphere in current home: Comfortable, ParamedicLoving  Family History:  Marital status: Long term relationship Long term relationship, how long?: 5 years What types of issues is patient dealing with in the relationship?: Denies any issues Additional relationship information: No Are you sexually active?: Yes What is your sexual orientation?: Heterosexual Has your sexual activity been affected by  drugs, alcohol, medication, or emotional stress?: No Does patient have children?: Yes How many children?: 1 How is patient's relationship with their children?: Reports having a close and good relationship with her three year old daughter.  Childhood History:  By whom was/is the patient raised?: Mother/father and step-parent, Mother Description of patient's relationship with caregiver when they were a child: Reports having a good relationship with her mother and stepfather during her childhood. Patient's description of current relationship with people who raised him/her: Reports she continues to have a good relationship with her mother and stepfather currently. How were you disciplined when you got in trouble as a child/adolescent?: Whoopings, verbally, restrictions Does patient have siblings?: Yes Number of Siblings: 4 Description of patient's current relationship with siblings: Reports not having a relationship with her oldest brother, however she reports being close to her remaining three siblings. Did patient suffer any verbal/emotional/physical/sexual abuse as a child?: No Did patient suffer from severe childhood neglect?: No Has patient ever been sexually abused/assaulted/raped as an adolescent or adult?: No Was the patient ever a victim of a crime or a disaster?: No Witnessed domestic violence?: No Has patient been effected by domestic violence as an adult?: No  Education:  Highest grade of school patient has completed: 12th grade Currently a student?: No Learning disability?: No  Employment/Work Situation:   Employment situation: Unemployed Patient's job has been impacted by current illness: No What is the longest time patient has a held a job?: 3 years Where was the patient employed at that time?: Waitress Did You Receive Any Psychiatric Treatment/Services While in [employer]?: No Are There Guns or Other Weapons in Your Home?: No  Financial Resources:   Financial  resources: No income, Income from spouse Does patient have a Lawyerrepresentative payee or guardian?: No  Alcohol/Substance Abuse:   What has been your use  of drugs/alcohol within the last 12 months?: Endorsed using heroin frequently. She reports "experimenting" with heroin "every, other day". States she uses at least $30 worth each use. If attempted suicide, did drugs/alcohol play a role in this?: No Alcohol/Substance Abuse Treatment Hx: Denies past history Has alcohol/substance abuse ever caused legal problems?: No  Social Support System:   Patient's Community Support System: Good Describe Community Support System: "My boyfriend, my parents and my siblings" Type of faith/religion: None How does patient's faith help to cope with current illness?: N/A  Leisure/Recreation:   Leisure and Hobbies: Drawing  Strengths/Needs:   What is the patient's perception of their strengths?: "I'm outgoing and funny" Patient states they can use these personal strengths during their treatment to contribute to their recovery: Yes Patient states these barriers may affect/interfere with their treatment: No Patient states these barriers may affect their return to the community: No Other important information patient would like considered in planning for their treatment: No  Discharge Plan:   Currently receiving community mental health services: No Patient states concerns and preferences for aftercare planning are: Expressed interest in outpatient medication management and substance abuse therapy services. 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 She presented to the hospital seeking treatment for worsening depression, subtance abuse and a suicide attempt by overdosing on heroin. During the assessment, Lashonta was pleasant and cooperative with providing information. Ashely reports that after she was released from jail, she began experiencing suicidal ideation. She shared that she has used heroin on a daily basis and that she wanted to "get clean for my daughter". Shondrea states that she would like to be referred to an outpatient provider for medication management and substance abuse therapy at discharge. Abbegayle can benefit from crisis stabilization, medication management, therapeutic milieu and referral services.  Marylee Floras. 11/20/2018

## 2018-11-20 NOTE — Tx Team (Signed)
Interdisciplinary Treatment and Diagnostic Plan Update  11/20/2018 Time of Session: 9:00am Kathryn Medina MRN: [id number]  Principal Diagnosis: <principal problem not specified>  Secondary Diagnoses: Active Problems:   * No active hospital problems. *   Current Medications:  Current Facility-Administered Medications  Medication Dose Route Frequency Provider Last Rate Last Dose  . dicyclomine (BENTYL) tablet 20 mg  20 mg Oral Q6H PRN Cobos, Myer Peer, MD   20 mg at 11/19/18 1723  . hydrOXYzine (ATARAX/VISTARIL) tablet 25 mg  25 mg Oral Q6H PRN Cobos, Myer Peer, MD   25 mg at 11/20/18 0524  . loperamide (IMODIUM) capsule 2-4 mg  2-4 mg Oral PRN Cobos, Myer Peer, MD      . LORazepam (ATIVAN) tablet 1 mg  1 mg Oral Q6H PRN Cobos, Myer Peer, MD   1 mg at 11/20/18 0814  . methocarbamol (ROBAXIN) tablet 500 mg  500 mg Oral Q8H PRN Cobos, Myer Peer, MD   500 mg at 11/20/18 0524  . multivitamin with minerals tablet 1 tablet  1 tablet Oral Daily Cobos, Myer Peer, MD   1 tablet at 11/20/18 0815  . naproxen (NAPROSYN) tablet 500 mg  500 mg Oral BID PRN Cobos, Myer Peer, MD   500 mg at 11/19/18 2229  . nicotine (NICODERM CQ - dosed in mg/24 hours) patch 14 mg  14 mg Transdermal Daily Cobos, Myer Peer, MD   14 mg at 11/20/18 0813  . ondansetron (ZOFRAN-ODT) disintegrating tablet 4 mg  4 mg Oral Q6H PRN Cobos, Myer Peer, MD      . thiamine (B-1) injection 100 mg  100 mg Intramuscular Once Cobos, Fernando A, MD      . thiamine (VITAMIN B-1) tablet 100 mg  100 mg Oral Daily Cobos, Myer Peer, MD   100 mg at 11/20/18 0815  . traZODone (DESYREL) tablet 100 mg  100 mg Oral QHS PRN Deloria Lair, NP   100 mg at 11/19/18 2229   PTA Medications: Medications Prior to Admission  Medication Sig Dispense Refill Last Dose  . HYDROcodone-acetaminophen (NORCO/VICODIN) 5-325 MG tablet Take 1 tablet by mouth every 6 (six) hours as needed for moderate pain or severe pain. (Patient not taking: Reported on  11/18/2018) 8 tablet 0   . ibuprofen (ADVIL,MOTRIN) 600 MG tablet Take 1 tablet (600 mg total) by mouth every 6 (six) hours. (Patient not taking: Reported on 01/14/2017) 30 tablet 0   . ibuprofen (ADVIL,MOTRIN) 800 MG tablet Take 1 tablet (800 mg total) by mouth every 8 (eight) hours as needed. (Patient not taking: Reported on 01/14/2017) 30 tablet 5   . medroxyPROGESTERone (DEPO-PROVERA) 150 MG/ML injection Inject 1 mL (150 mg total) into the muscle every 3 (three) months. (Patient not taking: Reported on 04/04/2018) 1 mL 4   . ondansetron (ZOFRAN ODT) 4 MG disintegrating tablet Take 1 tablet (4 mg total) by mouth every 8 (eight) hours as needed for nausea or vomiting. (Patient not taking: Reported on 04/04/2018) 20 tablet 0   . prenatal vitamin w/FE, FA (PRENATAL 1 + 1) 27-1 MG TABS tablet Take 1 tablet by mouth daily at 12 noon. (Patient not taking: Reported on 04/04/2018) 30 each 6   . senna-docusate (SENOKOT-S) 8.6-50 MG tablet Take 1 tablet by mouth at bedtime as needed for mild constipation. (Patient not taking: Reported on 01/14/2017) 30 tablet 0     Patient Stressors: Health problems Medication change or noncompliance Substance abuse Traumatic event  Patient Strengths: Average or above average intelligence  General fund of knowledge Physical Health Supportive family/friends  Treatment Modalities: Medication Management, Group therapy, Case management,  1 to 1 session with clinician, Psychoeducation, Recreational therapy.   Physician Treatment Plan for Primary Diagnosis: <principal problem not specified> Long Term Goal(s): Improvement in symptoms so as ready for discharge Improvement in symptoms so as ready for discharge   Short Term Goals: Ability to identify changes in lifestyle to reduce recurrence of condition will improve Ability to verbalize feelings will improve Ability to disclose and discuss suicidal ideas Ability to demonstrate self-control will improve Ability to identify and  develop effective coping behaviors will improve Ability to maintain clinical measurements within normal limits will improve Ability to identify changes in lifestyle to reduce recurrence of condition will improve Ability to identify triggers associated with substance abuse/mental health issues will improve  Medication Management: Evaluate patient's response, side effects, and tolerance of medication regimen.  Therapeutic Interventions: 1 to 1 sessions, Unit Group sessions and Medication administration.  Evaluation of Outcomes: Not Met  Physician Treatment Plan for Secondary Diagnosis: Active Problems:   * No active hospital problems. *  Long Term Goal(s): Improvement in symptoms so as ready for discharge Improvement in symptoms so as ready for discharge   Short Term Goals: Ability to identify changes in lifestyle to reduce recurrence of condition will improve Ability to verbalize feelings will improve Ability to disclose and discuss suicidal ideas Ability to demonstrate self-control will improve Ability to identify and develop effective coping behaviors will improve Ability to maintain clinical measurements within normal limits will improve Ability to identify changes in lifestyle to reduce recurrence of condition will improve Ability to identify triggers associated with substance abuse/mental health issues will improve     Medication Management: Evaluate patient's response, side effects, and tolerance of medication regimen.  Therapeutic Interventions: 1 to 1 sessions, Unit Group sessions and Medication administration.  Evaluation of Outcomes: Not Met   RN Treatment Plan for Primary Diagnosis: <principal problem not specified> Long Term Goal(s): Knowledge of disease and therapeutic regimen to maintain health will improve  Short Term Goals: Ability to demonstrate self-control, Ability to verbalize feelings will improve, Ability to disclose and discuss suicidal ideas and Ability to  identify and develop effective coping behaviors will improve  Medication Management: RN will administer medications as ordered by provider, will assess and evaluate patient's response and provide education to patient for prescribed medication. RN will report any adverse and/or side effects to prescribing provider.  Therapeutic Interventions: 1 on 1 counseling sessions, Psychoeducation, Medication administration, Evaluate responses to treatment, Monitor vital signs and CBGs as ordered, Perform/monitor CIWA, COWS, AIMS and Fall Risk screenings as ordered, Perform wound care treatments as ordered.  Evaluation of Outcomes: Progressing   LCSW Treatment Plan for Primary Diagnosis: <principal problem not specified> Long Term Goal(s): Safe transition to appropriate next level of care at discharge, Engage patient in therapeutic group addressing interpersonal concerns.  Short Term Goals: Engage patient in aftercare planning with referrals and resources, Increase social support, Increase emotional regulation, Identify triggers associated with mental health/substance abuse issues and Increase skills for wellness and recovery  Therapeutic Interventions: Assess for all discharge needs, 1 to 1 time with Social worker, Explore available resources and support systems, Assess for adequacy in community support network, Educate family and significant other(s) on suicide prevention, Complete Psychosocial Assessment, Interpersonal group therapy.  Evaluation of Outcomes: Progressing   Progress in Treatment: Attending groups: No. Participating in groups: No. Taking medication as prescribed: Yes. Toleration medication: Yes. Family/Significant  other contact made: No, will contact:  supports if consents are granted. Patient understands diagnosis: No. Discussing patient identified problems/goals with staff: Yes. Medical problems stabilized or resolved: Yes. Denies suicidal/homicidal ideation: Yes. Issues/concerns  per patient self-inventory: Yes.  New problem(s) identified: Yes, Describe:  legal issues, limited supports  New Short Term/Long Term Goal(s): detox, medication management for mood stabilization; elimination of SI thoughts; development of comprehensive mental wellness/sobriety plan.  Patient Goals:  Wants to discharge, as her daughter's birthday is this weekend  Discharge Plan or Barriers: CSW continuing to assess for appropriate referrals  Reason for Continuation of Hospitalization: Anxiety Depression Suicidal ideation Withdrawal symptoms  Estimated Length of Stay: 1-3 days  Attendees: Patient: Kathryn Medina 11/20/2018 9:39 AM  Physician: Larene Beach 11/20/2018 9:39 AM  Nursing: Benjamine Mola, Anna 11/20/2018 9:39 AM  RN Care Manager: 11/20/2018 9:39 AM  Social Worker: Stephanie Acre, Latanya Presser 11/20/2018 9:39 AM  Recreational Therapist:  11/20/2018 9:39 AM  Other:  11/20/2018 9:39 AM  Other:  11/20/2018 9:39 AM  Other: 11/20/2018 9:39 AM    Scribe for Treatment Team: Joellen Jersey, [place] 11/20/2018 9:39 AM

## 2018-11-20 NOTE — [facility] Group Notes (Signed)
[facility] Group Notes:  (Nursing/MHT/Case Management/Adjunct)  Date:  11/20/2018  Time:  10:00 AM  Type of Therapy:  Nurse Education  Participation Level:  Active  Participation Quality:  Appropriate, Attentive, Sharing and Supportive  Affect:  Anxious and Appropriate  Cognitive:  Alert, Appropriate and Lacking  Insight:  Appropriate, Improving and Lacking  Engagement in Group:  Developing/Improving, Engaged, Improving and Supportive  Modes of Intervention:  Discussion, Education and Exploration  Summary of Progress/Problems: Pt's were asked what some of the set-backs in their recovery have been. Pt's discussed alternatives and/or ways to combat these set-backs if these arise again. Examples were provided of coping skills and how to relate these to future situations. Pt was alert and appropriate in group. A few answers or comments makes this writer continue to feel as though the pt is dismissing the recovery she needs.  Summer Shade 11/20/2018, 11:24 AM

## 2018-11-20 NOTE — Progress Notes (Signed)
Recreation Therapy Notes  Date:  8.21.20 Time: 0930 Location: [address] Dayroom  Group Topic: Stress Management  Goal Area(s) Addresses:  Patient will identify positive stress management techniques. Patient will identify benefits of using stress management post d/c.  Behavioral Response:  Engaged  Intervention:  Stress Management  Activity : Guided Imagery.  LRT introduced the stress management technique of guided imagery.  LRT read a script that took patients on a journey to enjoy the peaceful waves at the beach.  Education:  Stress Management, Discharge Planning.   Education Outcome: Acknowledges Education  Clinical Observations/Feedback: Pt attended and participated in group session.     Victorino Sparrow, LRT/CTRS         Victorino Sparrow A 11/20/2018 10:25 AM

## 2018-11-21 NOTE — [facility] Suicide Risk Assessment (Signed)
[facility] INPATIENT:  Family/Significant Other Suicide Prevention Education  Suicide Prevention Education:  Contact Attempts: Pt's boyfriend, Leane Call, has been identified by the patient as the family member/significant other with whom the patient will be residing, and identified as the person(s) who will aid the patient in the event of a mental health crisis.  With written consent from the patient, two attempts were made to provide suicide prevention education, prior to and/or following the patient's discharge.  We were unsuccessful in providing suicide prevention education.  A suicide education pamphlet was given to the patient to share with family/significant other.  Date and time of first attempt: 11/21/2018 @ 9:13am Date and time of second attempt:  Trecia Rogers 11/21/2018, 9:14 AM

## 2018-11-21 NOTE — [facility] Group Notes (Signed)
[facility] Group Notes:  (Nursing/MHT/Case Management/Adjunct)  Date:  11/21/2018  Time:  1300  Type of Therapy:  Nurse Education  Participation Level:  Active  Participation Quality:  Intrusive  Affect:  Anxious  Cognitive:  Alert  Insight:  Improving  Engagement in Group:  Limited  Modes of Intervention:  Discussion and Education  Summary of Progress/Problems:  Marissa Calamity 11/21/2018

## 2018-11-21 NOTE — Progress Notes (Signed)
[facility] NOVEL CORONAVIRUS (COVID-19) DAILY CHECK-OFF SYMPTOMS - answer yes or no to each - every day NO YES  Have you had a fever in the past 24 hours?  . Fever (Temp > 37.80C / 100F) X   Have you had any of these symptoms in the past 24 hours? . New Cough .  Sore Throat  .  Shortness of Breath .  Difficulty Breathing .  Unexplained Body Aches   X   Have you had any one of these symptoms in the past 24 hours not related to allergies?   . Runny Nose .  Nasal Congestion .  Sneezing   X   If you have had runny nose, nasal congestion, sneezing in the past 24 hours, has it worsened?  X   EXPOSURES - check yes or no X   Have you traveled outside the state in the past 14 days?  X   Have you been in contact with someone with a confirmed diagnosis of COVID-19 or PUI in the past 14 days without wearing appropriate PPE?  X   Have you been living in the same home as a person with confirmed diagnosis of COVID-19 or a PUI (household contact)?    X   Have you been diagnosed with COVID-19?    X              What to do next: Answered NO to all: Answered YES to anything:   Proceed with unit schedule Follow the [facility] Inpatient Flowsheet.   

## 2018-11-21 NOTE — Progress Notes (Signed)
D.  Pt pleasant on approach, no complaints voiced.  Pt states that she is to discharge tomorrow and is looking forward to this.  Pt was positive for evening wrap up group, observed appropriately engaged with peers on the unit.  Pt denies SI/HI/AVH at this time.  A. Support and encouragement offered, medication given as ordered  R. Pt remains safe on the unit, will continue to monitor.

## 2018-11-21 NOTE — Progress Notes (Signed)
[facility] MD Progress Note  11/21/2018 11:06 AM Kathryn Medina  MRN:  [id number] Subjective: Reports she is feeling better.  Remains focused on being discharged soon.  At this time describes improving symptoms of withdrawal .  Objective: I have reviewed chart notes and I met with patient. 28 y old female, presented to hospital reporting opiate ( IV heroin), alcohol dependencies ( reports she has been using for about 3-4 months ) , depression ( which she reports is mainly substance induced ) and some suicidal ideations of overdosing on heroin.  Patient is presenting with improving mood and range of affect.  Denies suicidal ideations and presents future oriented.  Currently focused on being discharged soon.  States she does not want to miss her daughter's birthday which is tomorrow. She currently minimizes depression or neurovegetative symptoms. Describes improving symptoms of opiate withdrawal and currently does not endorse vomiting or diarrhea, has tolerated p.o. intake well.  Her vitals are stable. With her expressed consent and in her presence I contacted her mother-mother acknowledges that patient is improving, but expresses worry about patient's substance abuse and is hopeful that patient would agree to go to rehab after discharge.  I reviewed this with patient, currently she is not interested in rehab/residential setting following discharge, but does express interest in outpatient follow-up and in Suboxone maintenance/management referral. 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 Asthma    does not use inhaler    Past Surgical History:  Procedure Laterality Date  . DILATION AND EVACUATION  03/22/2012   Procedure:  DILATATION AND EVACUATION;  Surgeon: Shelly Bombard, MD;  Location: [facility];  Service: Gynecology;  Laterality: N/A;   Family History:  Family History  Problem Relation Age of Onset  . Cancer Paternal Grandfather   . Cancer Paternal Grandmother   . Emphysema Maternal Grandmother   . Hypertension Father   . Cancer Father    Family Psychiatric  History:  Social History:  Social History   Substance and Sexual Activity  Alcohol Use Yes   Comment: social, 2 drinks/day     Social History   Substance and Sexual Activity  Drug Use Yes  . Types: Oxycodone, Marijuana, Heroin    Social History   Socioeconomic History  . Marital status: Single    Spouse name: Not on file  . Number of children: Not on file  . Years of education: Not on file  . Highest education level: Not on file  Occupational History  . Not on file  Social Needs  . Financial resource strain: Not on file  . Food insecurity    Worry: Not on file    Inability: Not on file  . Transportation needs    Medical: Not on file    Non-medical: Not on file  Tobacco Use  . Smoking status: Current Every Day Smoker    Packs/day: 0.50    Years: 10.00    Pack years: 5.00    Types: Cigarettes  . Smokeless tobacco: Never Used  . Tobacco comment: pt is trying to use the electronic cigarette  Substance and Sexual Activity  . Alcohol use: Yes    Comment: social, 2 drinks/day  . Drug use: Yes  Types: Oxycodone, Marijuana, Heroin  . Sexual activity: Yes    Partners: Male    Birth control/protection: None  Lifestyle  . Physical activity    Days per week: Not on file    Minutes per session: Not on file  . Stress: Not on file  Relationships  . Social Herbalist on phone: Not on file    Gets together: Not on file    Attends religious service: Not on file    Active member of club or organization: Not on file    Attends meetings of clubs or organizations: Not on file    Relationship status: Not on file   Other Topics Concern  . Not on file  Social History Narrative  . Not on file   Additional Social History:   Sleep: Improving  Appetite:  Fair/improving  Current Medications: Current Facility-Administered Medications  Medication Dose Route Frequency Provider Last Rate Last Dose  . cloNIDine (CATAPRES) tablet 0.1 mg  0.1 mg Oral TID Oakleigh Hesketh, Myer Peer, MD   0.1 mg at 11/21/18 0835   Followed by  . [START ON 11/22/2018] cloNIDine (CATAPRES) tablet 0.1 mg  0.1 mg Oral BH-qamhs Kandra Graven, Myer Peer, MD       Followed by  . [START ON 11/24/2018] cloNIDine (CATAPRES) tablet 0.1 mg  0.1 mg Oral QAC breakfast Roxene Alviar A, MD      . dicyclomine (BENTYL) tablet 20 mg  20 mg Oral Q6H PRN Latrecia Capito, Myer Peer, MD   20 mg at 11/19/18 1723  . hydrOXYzine (ATARAX/VISTARIL) tablet 25 mg  25 mg Oral Q6H PRN Jlyn Cerros, Myer Peer, MD   25 mg at 11/20/18 2218  . loperamide (IMODIUM) capsule 2-4 mg  2-4 mg Oral PRN Girtha Kilgore, Myer Peer, MD      . LORazepam (ATIVAN) tablet 1 mg  1 mg Oral Q6H PRN Domonic Hiscox, Myer Peer, MD   1 mg at 11/20/18 0814  . methocarbamol (ROBAXIN) tablet 500 mg  500 mg Oral Q8H PRN Scout Gumbs, Myer Peer, MD   500 mg at 11/21/18 0636  . multivitamin with minerals tablet 1 tablet  1 tablet Oral Daily Judea Riches, Myer Peer, MD   1 tablet at 11/21/18 0835  . naproxen (NAPROSYN) tablet 500 mg  500 mg Oral BID PRN Kandra Graven, Myer Peer, MD   500 mg at 11/21/18 0835  . nicotine (NICODERM CQ - dosed in mg/24 hours) patch 14 mg  14 mg Transdermal Daily Sunil Hue, Myer Peer, MD   14 mg at 11/21/18 0836  . ondansetron (ZOFRAN-ODT) disintegrating tablet 4 mg  4 mg Oral Q6H PRN Abou Sterkel, Myer Peer, MD      . thiamine (B-1) injection 100 mg  100 mg Intramuscular Once Jacquel Redditt A, MD      . thiamine (VITAMIN B-1) tablet 100 mg  100 mg Oral Daily Maxemiliano Riel, Myer Peer, MD   100 mg at 11/21/18 0835  . traZODone (DESYREL) tablet 100 mg  100 mg Oral QHS PRN Deloria Lair, NP   100 mg at 11/20/18 2218    Lab Results:  No  results found for this or any previous visit (from the past 48 hour(s)).  Blood Alcohol level:  Lab Results  Component Value Date   ETH 69 (H) 11/18/2018   ETH <11 22/63/3354    Metabolic Disorder Labs: Lab Results  Component Value Date   HGBA1C 4.9 02/20/2016   No results found for: PROLACTIN No results found for: CHOL, TRIG, HDL, CHOLHDL, VLDL, LDLCALC  Physical Findings: AIMS: Facial and Oral Movements Muscles of Facial Expression: None, normal Lips and Perioral Area: None, normal Jaw: None, normal Tongue: None, normal,Extremity Movements Upper (arms, wrists, hands, fingers): None, normal Lower (legs, knees, ankles, toes): None, normal, Trunk Movements Neck, shoulders, hips: None, normal, Overall Severity Severity of abnormal movements (highest score from questions above): None, normal Incapacitation due to abnormal movements: None, normal Patient's awareness of abnormal movements (rate only patient's report): No Awareness, Dental Status Current problems with teeth and/or dentures?: No Does patient usually wear dentures?: No  CIWA:  CIWA-Ar Total: 2 COWS:  COWS Total Score: 3  Musculoskeletal: Strength & Muscle Tone: within normal limits-no tremors or diaphoresis Gait & Station: normal Patient leans: N/A  Psychiatric Specialty Exam: Physical Exam  ROS no shortness of breath, no cough, no vomiting, no diarrhea, describes improving myalgias/aches  Blood pressure 98/70, pulse 69, temperature 97.9 F (36.6 C), temperature source Oral, resp. rate 16, height 5' 8"  (1.727 m), weight 55.3 kg, SpO2 99 %.Body mass index is 18.55 kg/m.  General Appearance: Improved grooming  Eye Contact:  Good  Speech:  Normal Rate  Volume:  Normal  Mood:  Improving mood  Affect:  More reactive affect, remains vaguely anxious  Thought Process:  Linear and Descriptions of Associations: Intact  Orientation:  Full (Time, Place, and Person)  Thought Content:  Denies hallucinations , no  delusions expressed, currently does not appear internally preoccupied  Suicidal Thoughts:  No currently denies suicidal ideations and contracts for safety on unit  Homicidal Thoughts:  No  Memory:  Recent and remote grossly intact  Judgement:  Other:  Improving  Insight:  Fair  Psychomotor Activity:  Normal-no restlessness or agitation at this time, no distal tremors noted  Concentration:  Concentration: Improving and Attention Span: Improving  Recall:  Good  Fund of Knowledge:  Good  Language:  Good  Akathisia:  Negative  Handed:  Right  AIMS (if indicated):     Assets:  Communication Skills Desire for Improvement Resilience  ADL's:  Intact  Cognition:  WNL  Sleep:  Number of Hours: 6.75   Assessment: 34 y old female, presented to hospital reporting opiate ( IV heroin), alcohol dependencies ( reports she has been using for about 3-4 months ) , depression ( which she reports is mainly substance induced ) and some suicidal ideations of overdosing on heroin.  Patient is presenting with improving mood and range of affect.  Describes improving symptoms of opiate withdrawal and currently does not appear to be in any acute distress or discomfort.  As she improves she is focusing on discharge planning and hoping for discharge soon in order to reunite with her daughter/loved ones.  Mother, who I spoke with with patient's expressed consent, corroborates improvement but expresses hope that patient would go to a residential rehab at discharge.  At this time patient is not wanting to go to rehab/has expressed some interest in referral for outpatient Suboxone management. As mood presents significantly improved in the context of abstinence, and currently denies significant neurovegetative symptoms, no current indication for antidepressant medication. Treatment Plan Summary: Daily contact with patient to assess and evaluate symptoms and progress in treatment, Medication management, Plan Inpatient  treatment and Medications as below  Treatment plan reviewed as below today 8/22 Encourage group and milieu participation Encourage efforts to work on sobriety and relapse prevention Continue clonidine detox protocol for acute withdrawal Continue Ativan as needed for alcohol withdrawal as needed Continue Trazodone 100 mg nightly PRN  for insomnia as needed Treatment team working on disposition options.  Patient is planning on going to live with her mother after discharge. Jenne Campus, MD 11/21/2018, 11:06 AM   Patient ID: Nickolas Madrid, female   DOB: [birth year], 28 y.o.   MRN: [id number]

## 2018-11-21 NOTE — [facility] Group Notes (Signed)
LCSW Group Therapy Note  Date and Time: 11/21/2018 @ 10:00am  Type of Therapy and Topic: Group Therapy: Feelings Around Returning Home & Establishing a Supportive Framework and Supporting Oneself When Supports Not Available  Participation Level: [facility] PARTICIPATION LEVEL: Active  Mood: Pleasant    Description of Group:  Patients first processed thoughts and feelings about upcoming discharge. These included fears of upcoming changes, lack of change, new living environments, judgements and expectations from others and overall stigma of mental health issues. The group then discussed the definition of a supportive framework, what that looks and feels like, and how do to discern it from an unhealthy non-supportive network. The group identified different types of supports as well as what to do when your family/friends are less than helpful or unavailable     Therapeutic Goals   1.  Patient will identify one healthy supportive network that they can use at discharge.  2.  Patient will identify one factor of a supportive framework and how to tell it from an unhealthy network.  3.  Patient able to identify one coping skill to use when they do not have positive supports from others.  4.  Patient will demonstrate ability to communicate their needs through discussion and/or role plays.     Summary of Patient Progress:       Patient was active and engaged throughout group therapy. Patient was able to identify her dog and her daughter as a support once she discharges. Patient stated that her mother is an unhealthy network and the coping skill she is going to have to use is distancing herself from her mother and setting clear boundaries.      Therapeutic Modalities  Cognitive Behavioral Therapy  Motivational Interviewing   Ardelle Anton, MSW, [place]

## 2018-11-21 NOTE — Progress Notes (Signed)
D. Pt is anxious, but friendly upon approach- reports that she is wanting to be discharged, stating, "it's my daughter's birthday and my mother is in the hospital for a mini heart attack". Per pt's self inventory, pt rates her depression, hopelessness and anxiety all 0's. Pt writes that her most important goal to work on today is "getting out of here asap for my daughter", and writes that she will "stay positive" to help her to meet that goal. Pt writes on her self inventory that " I'm ready to go home. I know I'm ready, I feel 100% better".Pt currently denies withdrawal symptoms, SI/HI and AVH A. Labs and vitals monitored. Pt compliant with medications. Pt supported emotionally and encouraged to express concerns and ask questions.   R. Pt remains safe with 15 minute checks. Will continue POC.

## 2018-11-22 DIAGNOSIS — F1124 Opioid dependence with opioid-induced mood disorder: Secondary | ICD-10-CM

## 2018-11-22 DIAGNOSIS — F1094 Alcohol use, unspecified with alcohol-induced mood disorder: Secondary | ICD-10-CM

## 2018-11-22 MED ORDER — TRAZODONE HCL 100 MG PO TABS: 100.0000 mg | ORAL_TABLET | Freq: Every evening | ORAL | 0 refills | Status: DC | PRN

## 2018-11-22 NOTE — Progress Notes (Signed)
Adult Psychoeducational Group Note  Date:  11/22/2018 Time:  12:36 AM  Group Topic/Focus:  Wrap-Up Group:   The focus of this group is to help patients review their daily goal of treatment and discuss progress on daily workbooks.  Participation Level:  Active  Participation Quality:  Appropriate  Affect:  Appropriate  Cognitive:  Appropriate  Insight: Appropriate  Engagement in Group:  Engaged  Modes of Intervention:  Discussion  Additional Comments:  Pt stated her goal was to find out when she will be discharged.  Pt stated she is being discharged tomorrow for her daughter's birthday.  Pt rated the day at a 8/10.  [name] 11/22/2018, 12:36 AM

## 2018-11-22 NOTE — [facility] Suicide Risk Assessment (Signed)
[facility] INPATIENT:  Family/Significant Other Suicide Prevention Education  Suicide Prevention Education:  Education Completed;  boyfriend, Leane Call [phone]) has been identified by the patient as the family member/significant other with whom the patient will be residing, and identified as the person(s) who will aid the patient in the event of a mental health crisis (suicidal ideations/suicide attempt).  With written consent from the patient, the family member/significant other has been provided the following suicide prevention education, prior to the and/or following the discharge of the patient.  The suicide prevention education provided includes the following:  Suicide risk factors  Suicide prevention and interventions  National Suicide Hotline telephone number  [facility] assessment telephone number  [facility] Emergency Assistance [place] and/or Residential Mobile Crisis Unit telephone number  Request made of family/significant other to:  Remove weapons (e.g., guns, rifles, knives), all items previously/currently identified as safety concern.    Remove drugs/medications (over-the-counter, prescriptions, illicit drugs), all items previously/currently identified as a safety concern.  The family member/significant other verbalizes understanding of the suicide prevention education information provided.  The family member/significant other agrees to remove the items of safety concern listed above.  No real safety concerns from boyfriend, but we did review crisis information and discussed voluntary admission vs IVC. Boyfriend reports patient can have a short temper.  No additional questions or concerns for CSW.  Joellen Jersey 11/22/2018, 8:39 AM

## 2018-11-22 NOTE — Discharge Summary (Addendum)
Physician Discharge Summary Note  Patient:  Kathryn Medina is an 28 y.o., female MRN:  [id number] DOB:  07/22/1990 Patient phone:  [phone] (home)  Patient address:   [address] [address] [place] [id number],  Total Time spent with patient: 15 minutes  Date of Admission:  11/19/2018 Date of Discharge: 11/22/18  Reason for Admission:  Heroin and alcohol dependence  Principal Problem: <principal problem not specified> Discharge Diagnoses: Active Problems:   Opioid dependence with opioid-induced mood disorder (HCC)   Alcohol-induced mood disorder (HCC)   Past Psychiatric History: denies prior psychiatric admissions, denies history of suicide attempts or history of self injurious behaviors, denies history of psychosis, currently denies history of significant depression prior to onset of substance abuse, and does not endorse history of mania or hypomania. Does not endorse history of PTSD, denies panic,agoraphobia or excessive worrying. Denies history of violence.  Past Medical History:  Past Medical History:  Diagnosis Date  . Asthma    does not use inhaler    Past Surgical History:  Procedure Laterality Date  . DILATION AND EVACUATION  03/22/2012   Procedure: DILATATION AND EVACUATION;  Surgeon: Brock Badharles A Harper, MD;  Location: [facility];  Service: Gynecology;  Laterality: N/A;   Family History:  Family History  Problem Relation Age of Onset  . Cancer Paternal Grandfather   . Cancer Paternal Grandmother   . Emphysema Maternal Grandmother   . Hypertension Father   . Cancer Father    Family Psychiatric  History: denies history of mental illness in family, no history of suicides in family, denies history of alcohol or substance abuse in family Social History:  Social History   Substance and Sexual Activity  Alcohol Use Yes   Comment: social, 2 drinks/day     Social History   Substance and Sexual Activity  Drug Use Yes  . Types: Oxycodone, Marijuana, Heroin     Social History   Socioeconomic History  . Marital status: Single    Spouse name: Not on file  . Number of children: Not on file  . Years of education: Not on file  . Highest education level: Not on file  Occupational History  . Not on file  Social Needs  . Financial resource strain: Not on file  . Food insecurity    Worry: Not on file    Inability: Not on file  . Transportation needs    Medical: Not on file    Non-medical: Not on file  Tobacco Use  . Smoking status: Current Every Day Smoker    Packs/day: 0.50    Years: 10.00    Pack years: 5.00    Types: Cigarettes  . Smokeless tobacco: Never Used  . Tobacco comment: pt is trying to use the electronic cigarette  Substance and Sexual Activity  . Alcohol use: Yes    Comment: social, 2 drinks/day  . Drug use: Yes    Types: Oxycodone, Marijuana, Heroin  . Sexual activity: Yes    Partners: Male    Birth control/protection: None  Lifestyle  . Physical activity    Days per week: Not on file    Minutes per session: Not on file  . Stress: Not on file  Relationships  . Social Musicianconnections    Talks on phone: Not on file    Gets together: Not on file    Attends religious service: Not on file    Active member of club or organization: Not on file    Attends meetings  of clubs or organizations: Not on file    Relationship status: Not on file  Other Topics Concern  . Not on file  Social History Narrative  . Not on file    Hospital Course:  From admission H&P: 28 year old female, presented voluntarily to ED on 8/19. 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 Reports she has been using daily, up to one gram of heroin per day. Last used 1-2 days ago. In addition to opiates, also endorses daily alcohol consumption, and states she had been drinking up to 5 beers per day. She also reports depression, particularly over the last 2 weeks or so. On admission endorsed SI by overdosing on  heroin.  Attributes depression at least partially to substance abuse, and states her mood was better before drug use. Of note, she reports opiate abuse started about four months ago, and denies prior history of opiate or other drug abuse . Attributes onset of drug abuse to  " hanging out with people who were using", and does not currently endorse other stressors or triggers. Admission UDS positive for Opiates and Cannabis, admission BAL (+) at 69. Endorses neuro-vegetative symptoms of depression as below. Reports symptoms of opiate WDL- feels anxious, muscular aches, loose stools, lacrimation, " feeling hot and cold".   Ms. Kathryn Medina was admitted for heroin and alcohol dependence. [facility] She was started on clonidine COWS protocol for heroin withdrawal and CIWA protocol with Ativan PRN CIWA>10 for alcohol withdrawal. She participated in group therapy on the unit. She responded well to treatment with no adverse effects reported. She has shown improved mood, affect, sleep, and interaction. She denied depression or mood instability outside of drug/alcohol use. On day of discharge, she presents with euthymic affect and reports good mood. She states she is eager to return home for her daughter's birthday. She denies any SI/HI/AVH and contracts for safety. She denies withdrawal symptoms. She is discharging on the medications listed below. [facility] (see below). She is provided with prescription for medication upon discharge. Her boyfriend is picking her up for discharge home.  Physical Findings: AIMS: Facial and Oral Movements Muscles of Facial Expression: None, normal Lips and Perioral Area: None, normal Jaw: None, normal Tongue: None, normal,Extremity Movements Upper (arms, wrists, hands, fingers): None, normal Lower (legs, knees, ankles, toes): None, normal, Trunk Movements Neck, shoulders, hips: None, normal, Overall  Severity Severity of abnormal movements (highest score from questions above): None, normal Incapacitation due to abnormal movements: None, normal Patient's awareness of abnormal movements (rate only patient's report): No Awareness, Dental Status Current problems with teeth and/or dentures?: No Does patient usually wear dentures?: No  CIWA:  CIWA-Ar Total: 0 COWS:  COWS Total Score: 3  Musculoskeletal: Strength & Muscle Tone: within normal limits Gait & Station: normal Patient leans: N/A  Psychiatric Specialty Exam: Physical Exam  Nursing note and vitals reviewed. Constitutional: She is oriented to person, place, and time. She appears well-developed and well-nourished.  Cardiovascular: Normal rate.  Respiratory: Effort normal.  Neurological: She is alert and oriented to person, place, and time.    Review of Systems  Constitutional: Negative.   Respiratory: Negative for cough and shortness of breath.   Cardiovascular: Negative for chest pain.  Gastrointestinal: Negative for abdominal pain, diarrhea, nausea and vomiting.  Neurological: Negative for tremors, sensory change and headaches.  Psychiatric/Behavioral:  Positive for substance abuse. Negative for depression, hallucinations and suicidal ideas. The patient is not nervous/anxious and does not have insomnia.     Blood pressure 124/79, pulse 84, temperature 98.1 F (36.7 C), resp. rate 20, height 5\' 8"  (1.727 m), weight 55.3 kg, SpO2 100 %.Body mass index is 18.55 kg/m.  See MD's discharge SRA        Has this patient used any form of tobacco in the last 30 days? (Cigarettes, Smokeless Tobacco, Cigars, and/or Pipes)  Yes, A prescription for an FDA-approved tobacco cessation medication was offered at discharge and the patient refused  Blood Alcohol level:  Lab Results  Component Value Date   ETH 69 (H) 11/18/2018   ETH <11 84/16/6063    Metabolic Disorder Labs:  Lab Results  Component Value Date   HGBA1C 4.9 02/20/2016    No results found for: PROLACTIN No results found for: CHOL, TRIG, HDL, CHOLHDL, VLDL, LDLCALC  See Psychiatric Specialty Exam and Suicide Risk Assessment completed by Attending Physician prior to discharge.  Discharge destination:  Home  Is patient on multiple antipsychotic therapies at discharge:  No   Has Patient had three or more failed trials of antipsychotic monotherapy by history:  No  Recommended Plan for Multiple Antipsychotic Therapies: NA  Discharge Instructions    Discharge instructions   Complete by: As directed    Patient is instructed to take all prescribed medications as recommended. Report any side effects or adverse reactions to your outpatient psychiatrist. Patient is instructed to abstain from alcohol and illegal drugs while on prescription medications. In the event of worsening symptoms, patient is instructed to call the crisis hotline, 911, or go to the nearest emergency department for evaluation and treatment.     Allergies as of 11/22/2018      Reactions   Other Hives   pork      Medication List    STOP taking these medications   HYDROcodone-acetaminophen 5-325 MG tablet Commonly known as: NORCO/VICODIN   ibuprofen 600 MG tablet Commonly known as: ADVIL   ibuprofen 800 MG tablet Commonly known as: ADVIL   medroxyPROGESTERone 150 MG/ML injection Commonly known as: DEPO-PROVERA   ondansetron 4 MG disintegrating tablet Commonly known as: Zofran ODT   prenatal vitamin w/FE, FA 27-1 MG Tabs tablet   senna-docusate 8.6-50 MG tablet Commonly known as: Senokot-S     TAKE these medications     Indication  traZODone 100 MG tablet Commonly known as: DESYREL Take 1 tablet (100 mg total) by mouth at bedtime as needed for sleep.  Indication: Trouble Sleeping      Follow-up Information    [facility] Follow up on 11/24/2018.   Why: Telephonic hospital follow up appointment is Tuesday, 8/25 at 9:00a.  The provider will contact you for appointment.   Contact information: [address] [place] [address] [phone]        Services, [facility] Call.   Specialty: Behavioral Health Why: Office prefers for you to contact them regarding therapy services and medication assisted treatment. Please call office after discharge to schedule a therapy appointment.  Contact information: [address] [place]  [address] [phone]           Follow-up recommendations: Activity as tolerated. Diet as recommended by primary care physician. Keep all scheduled follow-up appointments as recommended.   Comments:   Patient is instructed to take all prescribed medications as recommended. Report any side effects or adverse reactions to your outpatient psychiatrist.  Patient is instructed to abstain from alcohol and illegal drugs while on prescription medications. In the event of worsening symptoms, patient is instructed to call the crisis hotline, 911, or go to the nearest emergency department for evaluation and treatment.  Signed: Aldean BakerJanet E Sykes, NP 11/22/2018, 11:23 AM   Patient seen, Suicide Assessment Completed.  Disposition Plan Reviewed

## 2018-11-22 NOTE — Progress Notes (Signed)
  [facility] Adult Case Management Discharge Plan :  Will you be returning to the same living situation after discharge:  Yes,  home At discharge, do you have transportation home?: Yes,  boyfriend will pick up. Do you have the ability to pay for your medications: No. Referred to [facility].   Release of information consent forms completed and in the chart. Letter and outpatient Suboxone provider list on chart.  Patient to Follow up at: Follow-up Information    [facility] Follow up on 11/24/2018.   Why: Telephonic hospital follow up appointment is Tuesday, 8/25 at 9:00a.  The provider will contact you for appointment.  Contact information: [address] [place] [address] [phone]        Services, [facility] Call.   Specialty: Behavioral Health Why: Office prefers for you to contact them regarding therapy services and medication assisted treatment. Please call office after discharge to schedule a therapy appointment.  Contact information: [place] [address] [phone]           Next level of care provider has access to [facility] and Suicide Prevention discussed: Yes,  with boyfriend, Joe   Has patient been referred to the Quitline?: Patient refused referral  Patient has been referred for addiction treatment: Yes  Joellen Jersey, [place] 11/22/2018, 9:32 AM

## 2018-11-22 NOTE — Progress Notes (Signed)
Pt discharged to lobby. Pt was stable and appreciative at that time. All papers  were given and valuables returned. Verbal understanding expressed. Denies SI/HI and A/VH. Pt given opportunity to express concerns and ask questions. °

## 2018-11-22 NOTE — Progress Notes (Signed)
D. Pt friendly upon approach-calm and cooperative behavior- observed in the milieu interacting well with peers and staff. Pt reports that she is looking forward to discharge and being able to see her daughter on her birthday. Per pt's self inventory, pt rates her depression, hopelessness and anxiety all 0's. Pt currently denies withdrawal symptoms, SI/HI and AVH A. Labs and vitals monitored. Pt compliant with medications. Pt supported emotionally and encouraged to express concerns and ask questions.   R. Pt remains safe with 15 minute checks. Will continue POC.

## 2018-11-22 NOTE — Progress Notes (Signed)
[facility] NOVEL CORONAVIRUS (COVID-19) DAILY CHECK-OFF SYMPTOMS - answer yes or no to each - every day NO YES  Have you had a fever in the past 24 hours?  . Fever (Temp > 37.80C / 100F) X   Have you had any of these symptoms in the past 24 hours? . New Cough .  Sore Throat  .  Shortness of Breath .  Difficulty Breathing .  Unexplained Body Aches   X   Have you had any one of these symptoms in the past 24 hours not related to allergies?   . Runny Nose .  Nasal Congestion .  Sneezing   X   If you have had runny nose, nasal congestion, sneezing in the past 24 hours, has it worsened?  X   EXPOSURES - check yes or no X   Have you traveled outside the state in the past 14 days?  X   Have you been in contact with someone with a confirmed diagnosis of COVID-19 or PUI in the past 14 days without wearing appropriate PPE?  X   Have you been living in the same home as a person with confirmed diagnosis of COVID-19 or a PUI (household contact)?    X   Have you been diagnosed with COVID-19?    X              What to do next: Answered NO to all: Answered YES to anything:   Proceed with unit schedule Follow the [facility] Inpatient Flowsheet.   

## 2018-11-22 NOTE — [facility] Suicide Risk Assessment (Signed)
[facility]   Principal Problem: Substance Induced Mood Disorder / Opiate Use Disorder Discharge Diagnoses: Substance Induced Mood Disorder / Opiate Use Disorder  Total Time spent with patient: 30 minutes  Musculoskeletal: Strength & Muscle Tone: within normal limits Gait & Station: normal Patient leans: N/A  Psychiatric Specialty Exam: ROS no headache, no chest pain, no shortness of breath, no cough, no vomiting, no fever or chills   Blood pressure 124/79, pulse 84, temperature 98.1 F (36.7 C), resp. rate 20, height 5\' 8"  (1.727 m), weight 55.3 kg, SpO2 100 %.Body mass index is 18.55 kg/m.  General Appearance: Well Groomed  Eye Contact::  Good  Speech:  Normal Rate409  Volume:  Normal  Mood:  improved, reports her mood is " a lot better",presents euthymic   Affect:  appropriate, more reactive   Thought Process:  Linear and Descriptions of Associations: Intact  Orientation:  Other:  fully alert and attentive   Thought Content:  no hallucinations, no delusions , not internally preoccupied   Suicidal Thoughts:  No denies suicidal or self injurious ideations, denies homicidal or violent ideations  Homicidal Thoughts:  No  Memory:  recent and remote grossly intact   Judgement:  Other:  improving  Insight:  fair/improving  Psychomotor Activity:  Normal  Concentration:  Good  Recall:  Good  Fund of Knowledge:Good  Language: Good  Akathisia:  Negative  Handed:  Right  AIMS (if indicated):     Assets:  Desire for Improvement Resilience  Sleep:  Number of Hours: 6.5  Cognition: WNL  ADL's:  Intact   Mental Status Per Nursing Assessment::   On Admission:  Suicidal ideation indicated by patient, Plan includes specific time, place, or method, Intention to act on suicide plan, Self-harm thoughts, Belief that plan would result in death, Suicide plan, Self-harm behaviors  Demographic Factors:  28, single, unemployed , lives with BF, has a 28 year old  daughter, currently with the child's grandmother  Loss Factors: Substance abuse   Historical Factors: History of substance abuse, reports history of alcohol and opiate use disorder . Denies prior psychiatric admissions, no prior history of suicide attempts   Risk Reduction Factors:   Sense of responsibility to family, Living with another person, especially a relative and Positive coping skills or problem solving skills  Continued Clinical Symptoms:  At this time patient is alert, attentive, calm, pleasant, mood improved, presents euthymic at this time, affect appropriate, no thought disorder, no suicidal or self injurious ideations, no homicidal ideations, no psychotic symptoms, future oriented.  Currently does not present with or endorse symptoms of WDL. Vitals stable, presents calm, comfortable. Denies medication side effects. Currently is not on any standing psychiatric medications .  Has been visible in day room, interactive with peers, pleasant on approach. We reviewed benefits of sobriety, abstinence and dangers associated with substance abuse , including risk of accidental overdose . We reviewed importance of avoiding people, places and things she associates with drug use , and encouraged her to consider NA or AA attendance . She reports she is also considering the option of going to an outpatient clinic for suboxone management.   Cognitive Features That Contribute To Risk:  No gross cognitive deficits noted upon discharge. Is alert , attentive, and oriented x 3   Suicide Risk:  Mild  Follow-up Information    [facility] Follow up on 11/24/2018.   Why: Telephonic hospital follow up appointment is Tuesday, 8/25 at 9:00a.  The provider will contact  you for appointment.  Contact information: [address] [place] [address] [phone]        [facility] Call.   Specialty: Behavioral Health Why: Office prefers for you to contact them regarding therapy  services.  Please call office after discharge to schedule a therapy appointment.  Contact information: [address] [place] [address] [phone]           Plan Of Care/Follow-up recommendations:  Activity:  as tolerated  Diet:  regular Tests:  NA Other:  See below  Patient is expressing readiness for discharge and is leaving unit in good spirits No grounds for involuntary commitment at this time. Follow up as above .  Jenne Campus, MD 11/22/2018, 8:50 AM

## 2018-11-22 NOTE — [facility] Group Notes (Signed)
LCSW Group Therapy Note  11/22/2018 9:07 AM  Type of Therapy/Topic: Group Therapy: Feelings about Diagnosis  Participation Level: Minimal   Description of Group:  This group will allow patients to explore their thoughts and feelings about diagnoses they have received. Patients will be guided to explore their level of understanding and acceptance of these diagnoses. Facilitator will encourage patients to process their thoughts and feelings about the reactions of others to their diagnosis and will guide patients in identifying ways to discuss their diagnosis with significant others in their lives. This group will be process-oriented, with patients participating in exploration of their own experiences, giving and receiving support, and processing challenge from other group members.  Therapeutic Goals: 1. Patient will demonstrate understanding of diagnosis as evidenced by identifying two or more symptoms of the disorder 2. Patient will be able to express two feelings regarding the diagnosis 3. Patient will demonstrate their ability to communicate their needs through discussion and/or role play  Summary of Patient Progress: Patient remained present throughout group. She was rude and intrusive at times and spoke off topic about politics and how she was dissatisfied with the food offerings at [facility].  Therapeutic Modalities:  Cognitive Behavioral Therapy Brief Therapy Feelings Identification   Stephanie Acre, MSW, [place] Social Worker

## 2019-03-23 ENCOUNTER — Other Ambulatory Visit: Payer: Self-pay

## 2019-03-23 ENCOUNTER — Emergency Department (HOSPITAL_COMMUNITY)
Admission: EM | Admit: 2019-03-23 | Discharge: 2019-03-23 | Discharge status: Against Medical Advice | Payer: Self-pay | Attending: Emergency Medicine | Admitting: Emergency Medicine

## 2019-03-23 ENCOUNTER — Emergency Department (HOSPITAL_COMMUNITY): Payer: Self-pay

## 2019-03-23 ENCOUNTER — Encounter (HOSPITAL_COMMUNITY): Payer: Self-pay | Admitting: Emergency Medicine

## 2019-03-23 DIAGNOSIS — Z5321 Procedure and treatment not carried out due to patient leaving prior to being seen by health care provider: Secondary | ICD-10-CM | POA: Insufficient documentation

## 2019-03-23 NOTE — ED Notes (Signed)
Pt returned from xray in a wheelchair after refusing to have xrays done. Pt then got up from the wheelchair just outside her room and began walking. This RN asked pt where she was going and she said "I'm getting the fuck up out of here". This RN notified the EDP immediately.

## 2019-03-23 NOTE — ED Notes (Signed)
Pt made aware of the need for urine.

## 2019-03-23 NOTE — ED Notes (Addendum)
Pt asking how long wait is and states, "I'm going to get up and walk out of this f--king place."  Explained to pt that I was taking her to a treatment room and she said the same thing again.

## 2019-03-23 NOTE — ED Notes (Signed)
Patient transported to X-ray 
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
## 2019-03-24 ENCOUNTER — Ambulatory Visit (HOSPITAL_COMMUNITY): Payer: Self-pay

## 2019-03-24 ENCOUNTER — Ambulatory Visit (HOSPITAL_COMMUNITY)
Admission: RE | Admit: 2019-03-24 | Discharge: 2019-03-24 | Disposition: A | Discharge status: Home / Self Care | Payer: Self-pay | Attending: Psychiatry | Admitting: Psychiatry

## 2019-03-24 DIAGNOSIS — F1110 Opioid abuse, uncomplicated: Secondary | ICD-10-CM | POA: Insufficient documentation

## 2019-03-24 DIAGNOSIS — F329 Major depressive disorder, single episode, unspecified: Secondary | ICD-10-CM | POA: Insufficient documentation

## 2019-03-24 DIAGNOSIS — F1010 Alcohol abuse, uncomplicated: Secondary | ICD-10-CM | POA: Insufficient documentation

## 2019-03-24 NOTE — Patient Outreach (Signed)
CPSS met with the patient and the patient's mother at the [facility] Observation Unit in order to provide substance use recovery support and provide information for substance use recovery resources. Patient reports a history of daily alcohol and heroin use. Patient requested help with getting connected to a detox treatment center to address the patient's current alcohol and heroin withdrawal symptoms. CPSS tried [facility] and they do not currently have a female detox bed available. [facility] will not be able to take the patient due to the patient's pending medicaid status. CPSS talked to the patient about a detox program located in [facility] and provided follow up information for this resource. The patient's mother is very supportive of the patient's substance use recovery process and is actively helping the patient get connected to substance use detox services. CPSS provided the patient and the patient's mom with information for several additional substance use recovery resources as well. Some of these resources include residential/outpatient substance use treatment center list, flier for [facility] of the [place] dual diagnosis treatment services, [place] [facility] [employer], and CPSS contact information. CPSS strongly encouraged both the patient and the patient's mother to follow up with CPSS if needed for any other questions or further help with getting connected to substance use recovery resources after discharge from the [facility] [place] Observation Unit.
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
## 2020-09-12 ENCOUNTER — Ambulatory Visit: Payer: Self-pay | Admitting: *Deleted

## 2020-09-12 NOTE — Telephone Encounter (Signed)
Reason for Disposition . Nursing judgment  Protocols used: No Guideline or Reference Available-A-AH  

## 2020-09-12 NOTE — Telephone Encounter (Signed)
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 Left 3am AMA. Calling to establish care with [facility] but I can't be seen until August." "So I can continue those antibiotics." Denies any pain, fever presently. Directed to ED. States will follow disposition.

## 2020-11-18 ENCOUNTER — Other Ambulatory Visit: Payer: Self-pay

## 2020-11-18 ENCOUNTER — Emergency Department (HOSPITAL_COMMUNITY): Payer: Self-pay

## 2020-11-18 ENCOUNTER — Inpatient Hospital Stay (HOSPITAL_COMMUNITY)
Admission: EM | Admit: 2020-11-18 | Discharge: 2020-11-19 | DRG: 871 | Discharge status: Against Medical Advice | Payer: Self-pay | Attending: Internal Medicine | Admitting: Internal Medicine

## 2020-11-18 ENCOUNTER — Emergency Department (HOSPITAL_COMMUNITY): Admission: EM | Admit: 2020-11-18 | Discharge: 2020-11-18 | Discharge status: Against Medical Advice | Payer: Self-pay

## 2020-11-18 DIAGNOSIS — B9561 Methicillin susceptible Staphylococcus aureus infection as the cause of diseases classified elsewhere: Secondary | ICD-10-CM | POA: Diagnosis present

## 2020-11-18 DIAGNOSIS — F1910 Other psychoactive substance abuse, uncomplicated: Secondary | ICD-10-CM | POA: Diagnosis present

## 2020-11-18 DIAGNOSIS — D649 Anemia, unspecified: Secondary | ICD-10-CM | POA: Diagnosis present

## 2020-11-18 DIAGNOSIS — U071 COVID-19: Secondary | ICD-10-CM | POA: Diagnosis present

## 2020-11-18 DIAGNOSIS — F17210 Nicotine dependence, cigarettes, uncomplicated: Secondary | ICD-10-CM | POA: Diagnosis present

## 2020-11-18 DIAGNOSIS — F1010 Alcohol abuse, uncomplicated: Secondary | ICD-10-CM | POA: Diagnosis present

## 2020-11-18 DIAGNOSIS — F1990 Other psychoactive substance use, unspecified, uncomplicated: Secondary | ICD-10-CM

## 2020-11-18 DIAGNOSIS — J189 Pneumonia, unspecified organism: Secondary | ICD-10-CM | POA: Diagnosis present

## 2020-11-18 DIAGNOSIS — B1920 Unspecified viral hepatitis C without hepatic coma: Secondary | ICD-10-CM | POA: Diagnosis present

## 2020-11-18 DIAGNOSIS — Z9151 Personal history of suicidal behavior: Secondary | ICD-10-CM

## 2020-11-18 DIAGNOSIS — Z86711 Personal history of pulmonary embolism: Secondary | ICD-10-CM

## 2020-11-18 DIAGNOSIS — Z7289 Other problems related to lifestyle: Secondary | ICD-10-CM

## 2020-11-18 DIAGNOSIS — L03116 Cellulitis of left lower limb: Secondary | ICD-10-CM | POA: Diagnosis present

## 2020-11-18 DIAGNOSIS — E871 Hypo-osmolality and hyponatremia: Secondary | ICD-10-CM | POA: Diagnosis present

## 2020-11-18 DIAGNOSIS — E876 Hypokalemia: Secondary | ICD-10-CM | POA: Diagnosis present

## 2020-11-18 DIAGNOSIS — A419 Sepsis, unspecified organism: Principal | ICD-10-CM | POA: Diagnosis present

## 2020-11-18 DIAGNOSIS — I330 Acute and subacute infective endocarditis: Secondary | ICD-10-CM | POA: Diagnosis present

## 2020-11-18 DIAGNOSIS — M5416 Radiculopathy, lumbar region: Secondary | ICD-10-CM | POA: Diagnosis present

## 2020-11-18 DIAGNOSIS — Z5329 Procedure and treatment not carried out because of patient's decision for other reasons: Secondary | ICD-10-CM | POA: Diagnosis present

## 2020-11-18 DIAGNOSIS — Z8249 Family history of ischemic heart disease and other diseases of the circulatory system: Secondary | ICD-10-CM

## 2020-11-18 LAB — CBC WITH DIFFERENTIAL/PLATELET
Abs Immature Granulocytes: 0.21 10*3/uL — ABNORMAL HIGH (ref 0.00–0.07)
Basophils Absolute: 0.1 10*3/uL (ref 0.0–0.1)
Basophils Relative: 0 %
Eosinophils Absolute: 0 10*3/uL (ref 0.0–0.5)
Eosinophils Relative: 0 %
HCT: 25.2 % — ABNORMAL LOW (ref 36.0–46.0)
Hemoglobin: 8.3 g/dL — ABNORMAL LOW (ref 12.0–15.0)
Immature Granulocytes: 1 %
Lymphocytes Relative: 16 %
Lymphs Abs: 3.6 10*3/uL (ref 0.7–4.0)
MCH: 26.3 pg (ref 26.0–34.0)
MCHC: 32.9 g/dL (ref 30.0–36.0)
MCV: 80 fL (ref 80.0–100.0)
Monocytes Absolute: 1.2 10*3/uL — ABNORMAL HIGH (ref 0.1–1.0)
Monocytes Relative: 5 %
Neutro Abs: 18.2 10*3/uL — ABNORMAL HIGH (ref 1.7–7.7)
Neutrophils Relative %: 78 %
Platelets: 541 10*3/uL — ABNORMAL HIGH (ref 150–400)
RBC: 3.15 MIL/uL — ABNORMAL LOW (ref 3.87–5.11)
RDW: 16.3 % — ABNORMAL HIGH (ref 11.5–15.5)
WBC: 23.3 10*3/uL — ABNORMAL HIGH (ref 4.0–10.5)
nRBC: 0 % (ref 0.0–0.2)

## 2020-11-18 MED ORDER — LACTATED RINGERS IV BOLUS (SEPSIS)
250.0000 mL | Freq: Once | INTRAVENOUS | Status: AC
  Administered 2020-11-19: 250 mL via INTRAVENOUS

## 2020-11-18 MED ORDER — LACTATED RINGERS IV BOLUS (SEPSIS)
1000.0000 mL | Freq: Once | INTRAVENOUS | Status: AC
  Administered 2020-11-19: 1000 mL via INTRAVENOUS

## 2020-11-18 MED ORDER — VANCOMYCIN HCL IN DEXTROSE 1-5 GM/200ML-% IV SOLN
1000.0000 mg | Freq: Once | INTRAVENOUS | Status: AC
  Administered 2020-11-19: 1000 mg via INTRAVENOUS
  Filled 2020-11-18: qty 200

## 2020-11-18 MED ORDER — LACTATED RINGERS IV BOLUS (SEPSIS)
500.0000 mL | Freq: Once | INTRAVENOUS | Status: AC
  Administered 2020-11-19: 500 mL via INTRAVENOUS

## 2020-11-18 MED ORDER — CEFEPIME HCL 2 G IJ SOLR
2.0000 g | Freq: Once | INTRAMUSCULAR | Status: AC
  Administered 2020-11-19: 2 g via INTRAVENOUS
  Filled 2020-11-18: qty 2

## 2020-11-18 MED ORDER — METRONIDAZOLE 500 MG/100ML IV SOLN
500.0000 mg | Freq: Once | INTRAVENOUS | Status: AC
  Administered 2020-11-19: 500 mg via INTRAVENOUS
  Filled 2020-11-18: qty 100

## 2020-11-18 MED ORDER — LACTATED RINGERS IV SOLN: INTRAVENOUS | Status: DC

## 2020-11-18 NOTE — ED Notes (Signed)
Patient states she is going to leave and go to [place], somewhere closer to home. Advised to stay as she could be septic, patient declined. Signed AMA form.

## 2020-11-18 NOTE — ED Triage Notes (Signed)
The pt reports that she is here after an infection in her heart shes all over the place   she reports that she was just in the hospital in [place] and around 1800 she was in the [facility] ed and she says that they are stupid there  she smoked a cigar earlielmp 2 weeksr that was laced with cocaine and maybe meth lmp 2 weeks ago

## 2020-11-18 NOTE — ED Triage Notes (Signed)
States she has an infection in her heart and it is very painful, states it hurts to breathe and her ribs hurt. Has other multiple complaints.

## 2020-11-18 NOTE — ED Notes (Signed)
Blood cultures x 2 sets drawn already in triage

## 2020-11-18 NOTE — ED Notes (Signed)
Pt care taken all labs have been drawn, just went to xray
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
## 2020-11-18 NOTE — ED Notes (Signed)
Now she is saying it was Thursday when she had the cigar laced with cocaine and maybe meth

## 2020-11-19 ENCOUNTER — Encounter: Payer: Self-pay | Admitting: Internal Medicine

## 2020-11-19 ENCOUNTER — Encounter (HOSPITAL_COMMUNITY): Payer: Self-pay | Admitting: Emergency Medicine

## 2020-11-19 DIAGNOSIS — F1910 Other psychoactive substance abuse, uncomplicated: Secondary | ICD-10-CM

## 2020-11-19 DIAGNOSIS — I330 Acute and subacute infective endocarditis: Secondary | ICD-10-CM

## 2020-11-19 DIAGNOSIS — U071 COVID-19: Secondary | ICD-10-CM | POA: Insufficient documentation

## 2020-11-19 DIAGNOSIS — A419 Sepsis, unspecified organism: Secondary | ICD-10-CM

## 2020-11-19 DIAGNOSIS — E876 Hypokalemia: Secondary | ICD-10-CM | POA: Insufficient documentation

## 2020-11-19 LAB — CBC WITH DIFFERENTIAL/PLATELET
Abs Immature Granulocytes: 0.16 10*3/uL — ABNORMAL HIGH (ref 0.00–0.07)
Basophils Absolute: 0.1 10*3/uL (ref 0.0–0.1)
Basophils Relative: 0 %
Eosinophils Absolute: 0 10*3/uL (ref 0.0–0.5)
Eosinophils Relative: 0 %
HCT: 21.1 % — ABNORMAL LOW (ref 36.0–46.0)
Hemoglobin: 6.8 g/dL — CL (ref 12.0–15.0)
Immature Granulocytes: 1 %
Lymphocytes Relative: 16 %
Lymphs Abs: 2.7 10*3/uL (ref 0.7–4.0)
MCH: 26.2 pg (ref 26.0–34.0)
MCHC: 32.2 g/dL (ref 30.0–36.0)
MCV: 81.2 fL (ref 80.0–100.0)
Monocytes Absolute: 0.9 10*3/uL (ref 0.1–1.0)
Monocytes Relative: 5 %
Neutro Abs: 12.6 10*3/uL — ABNORMAL HIGH (ref 1.7–7.7)
Neutrophils Relative %: 78 %
Platelets: 357 10*3/uL (ref 150–400)
RBC: 2.6 MIL/uL — ABNORMAL LOW (ref 3.87–5.11)
RDW: 16.1 % — ABNORMAL HIGH (ref 11.5–15.5)
WBC: 16.3 10*3/uL — ABNORMAL HIGH (ref 4.0–10.5)
nRBC: 0 % (ref 0.0–0.2)

## 2020-11-19 LAB — BLOOD CULTURE ID PANEL (REFLEXED) - BCID2

## 2020-11-19 LAB — LACTIC ACID, PLASMA
Lactic Acid, Venous: 0.9 mmol/L (ref 0.5–1.9)
Lactic Acid, Venous: 1.6 mmol/L (ref 0.5–1.9)
Lactic Acid, Venous: 3.2 mmol/L (ref 0.5–1.9)

## 2020-11-19 LAB — COMPREHENSIVE METABOLIC PANEL
ALT: 10 U/L (ref 0–44)
ALT: 8 U/L (ref 0–44)
AST: 12 U/L — ABNORMAL LOW (ref 15–41)
AST: 16 U/L (ref 15–41)
Albumin: 2 g/dL — ABNORMAL LOW (ref 3.5–5.0)
Albumin: 2.6 g/dL — ABNORMAL LOW (ref 3.5–5.0)
Alkaline Phosphatase: 56 U/L (ref 38–126)
Alkaline Phosphatase: 80 U/L (ref 38–126)
Anion gap: 11 (ref 5–15)
Anion gap: 7 (ref 5–15)
BUN: <5 mg/dL — ABNORMAL LOW (ref 6–20)
BUN: <5 mg/dL — ABNORMAL LOW (ref 6–20)
CO2: 25 mmol/L (ref 22–32)
CO2: 26 mmol/L (ref 22–32)
Calcium: 7.5 mg/dL — ABNORMAL LOW (ref 8.9–10.3)
Calcium: 8.3 mg/dL — ABNORMAL LOW (ref 8.9–10.3)
Chloride: 93 mmol/L — ABNORMAL LOW (ref 98–111)
Chloride: 96 mmol/L — ABNORMAL LOW (ref 98–111)
Creatinine, Ser: 0.76 mg/dL (ref 0.44–1.00)
Creatinine, Ser: 0.77 mg/dL (ref 0.44–1.00)
GFR, Estimated: >60 mL/min (ref >60)
GFR, Estimated: >60 mL/min (ref >60)
Glucose, Bld: 130 mg/dL — ABNORMAL HIGH (ref 70–99)
Glucose, Bld: 143 mg/dL — ABNORMAL HIGH (ref 70–99)
Potassium: 2.6 mmol/L — CL (ref 3.5–5.1)
Potassium: 2.8 mmol/L — ABNORMAL LOW (ref 3.5–5.1)
Sodium: 129 mmol/L — ABNORMAL LOW (ref 135–145)
Sodium: 129 mmol/L — ABNORMAL LOW (ref 135–145)
Total Bilirubin: 0.7 mg/dL (ref 0.3–1.2)
Total Bilirubin: 0.9 mg/dL (ref 0.3–1.2)
Total Protein: 5.4 g/dL — ABNORMAL LOW (ref 6.5–8.1)
Total Protein: 7.2 g/dL (ref 6.5–8.1)

## 2020-11-19 LAB — PROTIME-INR
INR: 1.3 — ABNORMAL HIGH (ref 0.8–1.2)
INR: 1.4 — ABNORMAL HIGH (ref 0.8–1.2)
Prothrombin Time: 15.8 seconds — ABNORMAL HIGH (ref 11.4–15.2)
Prothrombin Time: 16.9 seconds — ABNORMAL HIGH (ref 11.4–15.2)

## 2020-11-19 LAB — RAPID URINE DRUG SCREEN, HOSP PERFORMED
Amphetamines: POSITIVE — AB
Barbiturates: NOT DETECTED
Benzodiazepines: NOT DETECTED
Cocaine: POSITIVE — AB
Opiates: POSITIVE — AB
Tetrahydrocannabinol: POSITIVE — AB

## 2020-11-19 LAB — URINALYSIS, ROUTINE W REFLEX MICROSCOPIC
Bilirubin Urine: NEGATIVE
Glucose, UA: NEGATIVE mg/dL
Hgb urine dipstick: NEGATIVE
Ketones, ur: NEGATIVE mg/dL
Leukocytes,Ua: NEGATIVE
Nitrite: NEGATIVE
Protein, ur: NEGATIVE mg/dL
Specific Gravity, Urine: 1.015 (ref 1.005–1.030)
pH: 6 (ref 5.0–8.0)

## 2020-11-19 LAB — PREPARE RBC (CROSSMATCH)

## 2020-11-19 LAB — RESP PANEL BY RT-PCR (FLU A&B, COVID) ARPGX2
Influenza A by PCR: NEGATIVE
Influenza B by PCR: NEGATIVE
SARS Coronavirus 2 by RT PCR: POSITIVE — AB

## 2020-11-19 LAB — MAGNESIUM: Magnesium: 1.4 mg/dL — ABNORMAL LOW (ref 1.7–2.4)

## 2020-11-19 LAB — I-STAT BETA HCG BLOOD, ED (MC, WL, AP ONLY): I-stat hCG, quantitative: <5 m[IU]/mL (ref <5)

## 2020-11-19 LAB — APTT: aPTT: 38 seconds — ABNORMAL HIGH (ref 24–36)

## 2020-11-19 LAB — HIV ANTIBODY (ROUTINE TESTING W REFLEX): HIV Screen 4th Generation wRfx: NONREACTIVE

## 2020-11-19 MED ORDER — VANCOMYCIN HCL 750 MG/150ML IV SOLN
750.0000 mg | Freq: Two times a day (BID) | INTRAVENOUS | Status: DC
  Filled 2020-11-19: qty 150

## 2020-11-19 MED ORDER — POTASSIUM CHLORIDE CRYS ER 20 MEQ PO TBCR
40.0000 meq | EXTENDED_RELEASE_TABLET | Freq: Once | ORAL | Status: AC
  Administered 2020-11-19: 40 meq via ORAL
  Filled 2020-11-19: qty 2

## 2020-11-19 MED ORDER — VANCOMYCIN HCL IN DEXTROSE 1-5 GM/200ML-% IV SOLN
1000.0000 mg | Freq: Once | INTRAVENOUS | Status: DC
Start: 2020-11-19 — End: 2020-11-19
  Filled 2020-11-19: qty 200

## 2020-11-19 MED ORDER — SODIUM CHLORIDE 0.9 % IV SOLN: 2.0000 g | INTRAVENOUS | Status: DC

## 2020-11-19 MED ORDER — ONDANSETRON HCL 4 MG PO TABS: 4.0000 mg | ORAL_TABLET | Freq: Four times a day (QID) | ORAL | Status: DC | PRN

## 2020-11-19 MED ORDER — POTASSIUM CHLORIDE 10 MEQ/100ML IV SOLN
10.0000 meq | INTRAVENOUS | Status: AC
  Administered 2020-11-19 (×5): 10 meq via INTRAVENOUS
  Filled 2020-11-19 (×5): qty 100

## 2020-11-19 MED ORDER — ACETAMINOPHEN 325 MG PO TABS: 650.0000 mg | ORAL_TABLET | Freq: Four times a day (QID) | ORAL | Status: DC | PRN

## 2020-11-19 MED ORDER — SODIUM CHLORIDE 0.9% IV SOLUTION: Freq: Once | INTRAVENOUS | Status: DC

## 2020-11-19 MED ORDER — KETOROLAC TROMETHAMINE 30 MG/ML IJ SOLN
30.0000 mg | Freq: Once | INTRAMUSCULAR | Status: AC
  Administered 2020-11-19: 30 mg via INTRAVENOUS
  Filled 2020-11-19: qty 1

## 2020-11-19 MED ORDER — ONDANSETRON HCL 4 MG PO TABS: 4.0000 mg | ORAL_TABLET | Freq: Three times a day (TID) | ORAL | Status: DC | PRN

## 2020-11-19 MED ORDER — POLYETHYLENE GLYCOL 3350 17 G PO PACK: 17.0000 g | PACK | Freq: Every day | ORAL | Status: DC | PRN

## 2020-11-19 MED ORDER — ONDANSETRON HCL 4 MG/2ML IJ SOLN: 4.0000 mg | Freq: Four times a day (QID) | INTRAMUSCULAR | Status: DC | PRN

## 2020-11-19 MED ORDER — ACETAMINOPHEN 500 MG PO TABS
1000.0000 mg | ORAL_TABLET | Freq: Once | ORAL | Status: AC
  Administered 2020-11-19: 1000 mg via ORAL
  Filled 2020-11-19: qty 2

## 2020-11-19 MED ORDER — SODIUM CHLORIDE 0.9% FLUSH
3.0000 mL | Freq: Two times a day (BID) | INTRAVENOUS | Status: DC
  Administered 2020-11-19: 3 mL via INTRAVENOUS

## 2020-11-19 MED ORDER — ACETAMINOPHEN 650 MG RE SUPP: 650.0000 mg | Freq: Four times a day (QID) | RECTAL | Status: DC | PRN

## 2020-11-19 MED ORDER — PROSIGHT PO TABS
1.0000 | ORAL_TABLET | Freq: Every day | ORAL | Status: DC
  Filled 2020-11-19: qty 1

## 2020-11-19 MED ORDER — ONDANSETRON HCL 4 MG/2ML IJ SOLN: 4.0000 mg | Freq: Three times a day (TID) | INTRAMUSCULAR | Status: DC | PRN

## 2020-11-19 MED ORDER — ENOXAPARIN SODIUM 40 MG/0.4ML IJ SOSY: 40.0000 mg | PREFILLED_SYRINGE | INTRAMUSCULAR | Status: DC

## 2020-11-19 NOTE — ED Provider Notes (Signed)
[facility] EMERGENCY DEPARTMENT Provider Note   CSN: [id number] Arrival date & time: 11/18/20  2315     History Chief Complaint  Patient presents with   heart problems    Kathryn Medina is a 30 y.o. female.  The history is provided by the patient, a friend and medical records. The history is limited by the condition of the patient.  Illness Location:  Tricuspid valve, chest Quality:  Endocarditis Severity:  Severe Onset quality:  Gradual Duration:  3 months Timing:  Constant Progression:  Waxing and waning Chronicity:  New Context:  IVDU, heroin Relieved by:  Nothing Worsened by:  Time Ineffective treatments:  Short courses of antibiotics, has AMA from [place] and [facility] during treatment Associated symptoms: fever and shortness of breath   Associated symptoms: no congestion, no diarrhea, no vomiting and no wheezing   Risk factors:  IVDU     Past Medical History:  Diagnosis Date   Asthma    does not use inhaler    Patient Active Problem List   Diagnosis Date Noted   Opioid dependence with opioid-induced mood disorder (HCC)    Alcohol-induced mood disorder (HCC)    Non-reassuring fetal heart tones complicating pregnancy, antepartum 08/27/2016   Back pain affecting pregnancy 04/16/2016   Drug abuse during pregnancy (HCC) 03/19/2016   Supervision of normal first pregnancy 01/23/2016    Past Surgical History:  Procedure Laterality Date   DILATION AND EVACUATION  03/22/2012   Procedure: DILATATION AND EVACUATION;  Surgeon: Brock Bad, MD;  Location: [facility];  Service: Gynecology;  Laterality: N/A;     OB History     Gravida  2   Para  1   Term  1   Preterm  0   AB  1   Living  1      SAB  1   IAB  0   Ectopic  0   Multiple  0   Live Births  1           Family History  Problem Relation Age of Onset   Cancer Paternal Grandfather    Cancer Paternal Grandmother    Emphysema Maternal Grandmother    Hypertension  Father    Cancer Father     Social History   Tobacco Use   Smoking status: Every Day    Packs/day: 0.50    Years: 10.00    Pack years: 5.00    Types: Cigarettes   Smokeless tobacco: Never   Tobacco comments:    pt is trying to use the electronic cigarette  Vaping Use   Vaping Use: Never used  Substance Use Topics   Alcohol use: Yes    Comment: social, 2 drinks/day   Drug use: Yes    Types: Oxycodone, Marijuana, Heroin    Home Medications Prior to Admission medications   Medication Sig Start Date End Date Taking? Authorizing Provider  traZODone (DESYREL) 100 MG tablet Take 1 tablet (100 mg total) by mouth at bedtime as needed for sleep. 11/22/18   Aldean Baker, NP    Allergies    Other  Review of Systems   Review of Systems  Constitutional:  Positive for fever.  HENT:  Negative for congestion and facial swelling.   Eyes:  Negative for redness.  Respiratory:  Positive for shortness of breath. Negative for wheezing.   Cardiovascular:  Negative for leg swelling.  Gastrointestinal:  Negative for diarrhea and vomiting.  Genitourinary:  Negative for difficulty  urinating.  Musculoskeletal:  Negative for neck stiffness.  Skin:  Negative for wound.  Neurological:  Negative for facial asymmetry.  Psychiatric/Behavioral:  Negative for confusion.   All other systems reviewed and are negative.  Physical Exam Updated Vital Signs BP 105/67 (BP Location: Left Arm)   Pulse (!) 111   Temp (!) 103.1 F (39.5 C) (Oral)   Resp 20   Ht 5\' 8"  (1.727 m)   Wt 55.3 kg   LMP 11/16/2020   SpO2 93%   BMI 18.54 kg/m   Physical Exam Vitals and nursing note reviewed.  Constitutional:      Appearance: Normal appearance. She is not diaphoretic.  HENT:     Head: Normocephalic and atraumatic.     Nose: Nose normal.  Eyes:     Conjunctiva/sclera: Conjunctivae normal.     Pupils: Pupils are equal, round, and reactive to light.  Cardiovascular:     Rate and Rhythm: Regular rhythm.  Tachycardia present.     Pulses: Normal pulses.     Heart sounds: Murmur heard.  Pulmonary:     Effort: Pulmonary effort is normal.     Breath sounds: Normal breath sounds.  Abdominal:     General: Abdomen is flat. Bowel sounds are normal.     Palpations: Abdomen is soft.     Tenderness: There is no abdominal tenderness. There is no guarding.  Musculoskeletal:        General: Normal range of motion.     Cervical back: Normal range of motion and neck supple. No rigidity.     Right lower leg: No edema.     Left lower leg: No edema.  Lymphadenopathy:     Cervical: No cervical adenopathy.  Skin:    General: Skin is warm and dry.     Capillary Refill: Capillary refill takes less than 2 seconds.     Findings: No erythema.  Neurological:     General: No focal deficit present.     Mental Status: She is alert and oriented to person, place, and time.     Deep Tendon Reflexes: Reflexes normal.  Psychiatric:        Mood and Affect: Mood normal.        Behavior: Behavior normal.    ED Results / Procedures / Treatments   Labs (all labs ordered are listed, but only abnormal results are displayed) Results for orders placed or performed during the hospital encounter of 11/18/20  Comprehensive metabolic panel  Result Value Ref Range   Sodium 129 (L) 135 - 145 mmol/L   Potassium 2.6 (LL) 3.5 - 5.1 mmol/L   Chloride 93 (L) 98 - 111 mmol/L   CO2 25 22 - 32 mmol/L   Glucose, Bld 143 (H) 70 - 99 mg/dL   BUN <5 (L) 6 - 20 mg/dL   Creatinine, Ser 11/20/20 0.44 - 1.00 mg/dL   Calcium 8.3 (L) 8.9 - 10.3 mg/dL   Total Protein 7.2 6.5 - 8.1 g/dL   Albumin 2.6 (L) 3.5 - 5.0 g/dL   AST 16 15 - 41 U/L   ALT 10 0 - 44 U/L   Alkaline Phosphatase 80 38 - 126 U/L   Total Bilirubin 0.7 0.3 - 1.2 mg/dL   GFR, Estimated 2.44 >01 mL/min   Anion gap 11 5 - 15  Lactic acid, plasma  Result Value Ref Range   Lactic Acid, Venous 1.6 0.5 - 1.9 mmol/L  CBC with Differential  Result Value Ref Range   WBC  23.3  (H) 4.0 - 10.5 K/uL   RBC 3.15 (L) 3.87 - 5.11 MIL/uL   Hemoglobin 8.3 (L) 12.0 - 15.0 g/dL   HCT 40.925.2 (L) 81.136.0 - 91.446.0 %   MCV 80.0 80.0 - 100.0 fL   MCH 26.3 26.0 - 34.0 pg   MCHC 32.9 30.0 - 36.0 g/dL   RDW 78.216.3 (H) 95.611.5 - 21.315.5 %   Platelets 541 (H) 150 - 400 K/uL   nRBC 0.0 0.0 - 0.2 %   Neutrophils Relative % 78 %   Neutro Abs 18.2 (H) 1.7 - 7.7 K/uL   Lymphocytes Relative 16 %   Lymphs Abs 3.6 0.7 - 4.0 K/uL   Monocytes Relative 5 %   Monocytes Absolute 1.2 (H) 0.1 - 1.0 K/uL   Eosinophils Relative 0 %   Eosinophils Absolute 0.0 0.0 - 0.5 K/uL   Basophils Relative 0 %   Basophils Absolute 0.1 0.0 - 0.1 K/uL   Immature Granulocytes 1 %   Abs Immature Granulocytes 0.21 (H) 0.00 - 0.07 K/uL  Protime-INR  Result Value Ref Range   Prothrombin Time 15.8 (H) 11.4 - 15.2 seconds   INR 1.3 (H) 0.8 - 1.2  APTT  Result Value Ref Range   aPTT 38 (H) 24 - 36 seconds  I-Stat beta hCG blood, ED  Result Value Ref Range   I-stat hCG, quantitative <5.0 <5 mIU/mL   Comment 3           DG Chest 2 View  Result Date: 11/19/2020 CLINICAL DATA:  Sepsis. EXAM: CHEST - 2 VIEW COMPARISON:  Chest radiograph dated 05/05/2017. FINDINGS: Areas of airspace opacity involving the right middle lobe and to a lesser degree left upper lobe consistent with multilobar pneumonia. Clinical correlation and follow-up to resolution recommended. Trace left pleural effusion. No pneumothorax. The cardiac silhouette is within normal limits. No acute osseous pathology. IMPRESSION: Multifocal pneumonia. Electronically Signed   By: Elgie CollardArash  Radparvar M.D.   On: 11/19/2020 00:13    EKG None  Radiology DG Chest 2 View  Result Date: 11/19/2020 CLINICAL DATA:  Sepsis. EXAM: CHEST - 2 VIEW COMPARISON:  Chest radiograph dated 05/05/2017. FINDINGS: Areas of airspace opacity involving the right middle lobe and to a lesser degree left upper lobe consistent with multilobar pneumonia. Clinical correlation and follow-up to  resolution recommended. Trace left pleural effusion. No pneumothorax. The cardiac silhouette is within normal limits. No acute osseous pathology. IMPRESSION: Multifocal pneumonia. Electronically Signed   By: Elgie CollardArash  Radparvar M.D.   On: 11/19/2020 00:13    Procedures Procedures   Medications Ordered in ED Medications  lactated ringers infusion (has no administration in time range)  ceFEPIme (MAXIPIME) 2 g in sodium chloride 0.9 % 100 mL IVPB (2 g Intravenous New Bag/Given 11/19/20 0029)  metroNIDAZOLE (FLAGYL) IVPB 500 mg (has no administration in time range)  vancomycin (VANCOCIN) IVPB 1000 mg/200 mL premix (has no administration in time range)  lactated ringers bolus 1,000 mL (1,000 mLs Intravenous New Bag/Given 11/19/20 0029)    And  lactated ringers bolus 500 mL (has no administration in time range)    And  lactated ringers bolus 250 mL (has no administration in time range)  acetaminophen (TYLENOL) tablet 1,000 mg (has no administration in time range)  ketorolac (TORADOL) 30 MG/ML injection 30 mg (has no administration in time range)  potassium chloride 10 mEq in 100 mL IVPB (has no administration in time range)    ED Course  I have reviewed the triage vital signs  and the nursing notes.  Pertinent labs & imaging results that were available during my care of the patient were reviewed by me and considered in my medical decision making (see chart for details).   MDM Reviewed: previous chart, nursing note and vitals Interpretation: labs and x-ray Total time providing critical care: 75-105 minutes (sepsis antibiotics). This excludes time spent performing separately reportable procedures and services. Consults: admitting MD CRITICAL CARE Performed by: Aubree Doody K Felisia Balcom-Rasch Total critical care time: 75 minutes Critical care time was exclusive of separately billable procedures and treating other patients. Critical care was necessary to treat or prevent imminent or life-threatening  deterioration. Critical care was time spent personally by me on the following activities: development of treatment plan with patient and/or surrogate as well as nursing, discussions with consultants, evaluation of patient's response to treatment, examination of patient, obtaining history from patient or surrogate, ordering and performing treatments and interventions, ordering and review of laboratory studies, ordering and review of radiographic studies, pulse oximetry and re-evaluation of patient's condition.   Final Clinical Impression(s) / ED Diagnoses Final diagnoses:  Acute infective endocarditis, due to unspecified organism  Sepsis, due to unspecified organism, unspecified whether acute organ dysfunction present [facility])  IVDU (intravenous drug user)   Admit to medicine for sepsis from endocarditis  Rx / DC Orders ED Discharge Orders     None        Fenton Candee, MD 11/19/20 3086
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
## 2020-11-19 NOTE — ED Notes (Signed)
Pt started on potassium given a soda

## 2020-11-19 NOTE — Progress Notes (Signed)
Pt being followed by [facility] for Sepsis protocol. 

## 2020-11-19 NOTE — ED Notes (Signed)
Breakfast Ordered 
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
## 2020-11-19 NOTE — ED Notes (Signed)
Per admitting team, pt visibly more lethargic since visitor arrived. Two staff members witnessed visitor going to ER restroom multiple times and exhibiting bizarre behavior. Concern based on pt presentation, pt history, and visitor presentation for IVDU. Charge RN aware. This RN spoke with security and [employer] regarding searching belongings/asking visitor to leave. Admitting MD aware. Pt stated she would leave if her visitor left. While this RN, CN, NT, and security were at bedside, visitor was standing and visibly lethargic with eyes closed. Pt and visitor made aware of firm policy. Pt stated she was leaving. Pt made aware of risks of leaving AMA, pt verbalized understanding.

## 2020-11-19 NOTE — ED Notes (Signed)
Date and time results received: 11/19/20 0046  Test: potassium  Critical Value: 2.6  Name of Provider Notified: Palumbo   Orders Received? Or Actions Taken?: n/a

## 2020-11-19 NOTE — ED Notes (Signed)
Admitting team at bedside at this time.

## 2020-11-19 NOTE — Progress Notes (Signed)
Pharmacy Antibiotic Note  Kathryn Medina is a 30 y.o. female admitted on 11/18/2020 with bacteremia.  Pharmacy has been consulted for vancomycin dosing.  Plan: Vancomycin 1gm IV x 1 then 750 mg IV q12 hours F/u renal function, cultures and clinical course  Height: 5\' 8"  (172.7 cm) Weight: 55.3 kg (121 lb 14.6 oz) IBW/kg (Calculated) : 63.9  Temp (24hrs), Avg:102.1 F (38.9 C), Min:101.1 F (38.4 C), Max:103.1 F (39.5 C)  Recent Labs  Lab 11/18/20 2340 11/19/20 0032  WBC 23.3*  --   CREATININE 0.76  --   LATICACIDVEN 1.6 3.2*    Estimated Creatinine Clearance: 89.8 mL/min (by C-G formula based on SCr of 0.76 mg/dL).    Allergies  Allergen Reactions   Other Hives    pork   Thank you for allowing pharmacy to be a part of this patient's care.  11/21/20 [place] 11/19/2020 3:12 AM

## 2020-11-20 LAB — URINE CULTURE

## 2020-11-20 LAB — ABO/RH: ABO/RH(D): O POS

## 2020-11-21 ENCOUNTER — Encounter (HOSPITAL_COMMUNITY): Payer: Self-pay | Admitting: Emergency Medicine

## 2020-11-21 ENCOUNTER — Emergency Department (HOSPITAL_COMMUNITY): Payer: Self-pay

## 2020-11-21 ENCOUNTER — Inpatient Hospital Stay (HOSPITAL_COMMUNITY): Payer: Self-pay

## 2020-11-21 ENCOUNTER — Inpatient Hospital Stay (HOSPITAL_COMMUNITY)
Admission: EM | Admit: 2020-11-21 | Discharge: 2020-11-25 | DRG: 871 | Discharge status: Against Medical Advice | Payer: Self-pay | Attending: Internal Medicine | Admitting: Internal Medicine

## 2020-11-21 ENCOUNTER — Other Ambulatory Visit: Payer: Self-pay

## 2020-11-21 DIAGNOSIS — D509 Iron deficiency anemia, unspecified: Secondary | ICD-10-CM | POA: Diagnosis present

## 2020-11-21 DIAGNOSIS — J189 Pneumonia, unspecified organism: Secondary | ICD-10-CM | POA: Diagnosis present

## 2020-11-21 DIAGNOSIS — M5416 Radiculopathy, lumbar region: Secondary | ICD-10-CM | POA: Diagnosis present

## 2020-11-21 DIAGNOSIS — Z20822 Contact with and (suspected) exposure to covid-19: Secondary | ICD-10-CM | POA: Diagnosis present

## 2020-11-21 DIAGNOSIS — F1113 Opioid abuse with withdrawal: Secondary | ICD-10-CM | POA: Diagnosis not present

## 2020-11-21 DIAGNOSIS — Z8249 Family history of ischemic heart disease and other diseases of the circulatory system: Secondary | ICD-10-CM

## 2020-11-21 DIAGNOSIS — Z8616 Personal history of COVID-19: Secondary | ICD-10-CM

## 2020-11-21 DIAGNOSIS — F1910 Other psychoactive substance abuse, uncomplicated: Secondary | ICD-10-CM | POA: Diagnosis present

## 2020-11-21 DIAGNOSIS — M79672 Pain in left foot: Secondary | ICD-10-CM | POA: Diagnosis present

## 2020-11-21 DIAGNOSIS — I2690 Septic pulmonary embolism without acute cor pulmonale: Secondary | ICD-10-CM | POA: Diagnosis present

## 2020-11-21 DIAGNOSIS — I38 Endocarditis, valve unspecified: Secondary | ICD-10-CM

## 2020-11-21 DIAGNOSIS — B9561 Methicillin susceptible Staphylococcus aureus infection as the cause of diseases classified elsewhere: Secondary | ICD-10-CM

## 2020-11-21 DIAGNOSIS — I330 Acute and subacute infective endocarditis: Secondary | ICD-10-CM | POA: Diagnosis present

## 2020-11-21 DIAGNOSIS — R7881 Bacteremia: Secondary | ICD-10-CM

## 2020-11-21 DIAGNOSIS — A4101 Sepsis due to Methicillin susceptible Staphylococcus aureus: Principal | ICD-10-CM | POA: Diagnosis present

## 2020-11-21 DIAGNOSIS — Z5329 Procedure and treatment not carried out because of patient's decision for other reasons: Secondary | ICD-10-CM | POA: Diagnosis not present

## 2020-11-21 DIAGNOSIS — A419 Sepsis, unspecified organism: Secondary | ICD-10-CM | POA: Diagnosis present

## 2020-11-21 DIAGNOSIS — I081 Rheumatic disorders of both mitral and tricuspid valves: Secondary | ICD-10-CM | POA: Diagnosis present

## 2020-11-21 DIAGNOSIS — J45909 Unspecified asthma, uncomplicated: Secondary | ICD-10-CM | POA: Diagnosis present

## 2020-11-21 DIAGNOSIS — E876 Hypokalemia: Secondary | ICD-10-CM | POA: Diagnosis present

## 2020-11-21 DIAGNOSIS — B1920 Unspecified viral hepatitis C without hepatic coma: Secondary | ICD-10-CM | POA: Diagnosis present

## 2020-11-21 DIAGNOSIS — Z825 Family history of asthma and other chronic lower respiratory diseases: Secondary | ICD-10-CM

## 2020-11-21 DIAGNOSIS — U071 COVID-19: Secondary | ICD-10-CM | POA: Diagnosis present

## 2020-11-21 DIAGNOSIS — F17210 Nicotine dependence, cigarettes, uncomplicated: Secondary | ICD-10-CM | POA: Diagnosis present

## 2020-11-21 DIAGNOSIS — D649 Anemia, unspecified: Secondary | ICD-10-CM

## 2020-11-21 LAB — URINALYSIS, ROUTINE W REFLEX MICROSCOPIC
Bacteria, UA: NONE SEEN
Bilirubin Urine: NEGATIVE
Glucose, UA: NEGATIVE mg/dL
Hgb urine dipstick: NEGATIVE
Ketones, ur: NEGATIVE mg/dL
Nitrite: NEGATIVE
Protein, ur: NEGATIVE mg/dL
Specific Gravity, Urine: 1.013 (ref 1.005–1.030)
pH: 8 (ref 5.0–8.0)

## 2020-11-21 LAB — TYPE AND SCREEN
ABO/RH(D): O POS
Antibody Screen: NEGATIVE
Unit division: 0

## 2020-11-21 LAB — CULTURE, BLOOD (ROUTINE X 2): Special Requests: ADEQUATE

## 2020-11-21 LAB — ECHOCARDIOGRAM COMPLETE: S' Lateral: 2.9 cm

## 2020-11-21 LAB — LACTIC ACID, PLASMA
Lactic Acid, Venous: 1.7 mmol/L (ref 0.5–1.9)
Lactic Acid, Venous: 1.5 mmol/L (ref 0.5–1.9)

## 2020-11-21 LAB — COMPREHENSIVE METABOLIC PANEL
ALT: 10 U/L (ref 0–44)
AST: 19 U/L (ref 15–41)
Albumin: 2.4 g/dL — ABNORMAL LOW (ref 3.5–5.0)
Alkaline Phosphatase: 80 U/L (ref 38–126)
Anion gap: 11 (ref 5–15)
BUN: <5 mg/dL — ABNORMAL LOW (ref 6–20)
CO2: 25 mmol/L (ref 22–32)
Calcium: 8 mg/dL — ABNORMAL LOW (ref 8.9–10.3)
Chloride: 95 mmol/L — ABNORMAL LOW (ref 98–111)
Creatinine, Ser: 0.63 mg/dL (ref 0.44–1.00)
GFR, Estimated: >60 mL/min (ref >60)
Glucose, Bld: 127 mg/dL — ABNORMAL HIGH (ref 70–99)
Potassium: 2.7 mmol/L — CL (ref 3.5–5.1)
Sodium: 131 mmol/L — ABNORMAL LOW (ref 135–145)
Total Bilirubin: 0.5 mg/dL (ref 0.3–1.2)
Total Protein: 6.4 g/dL — ABNORMAL LOW (ref 6.5–8.1)

## 2020-11-21 LAB — CBC WITH DIFFERENTIAL/PLATELET
Abs Immature Granulocytes: 0.13 K/uL — ABNORMAL HIGH (ref 0.00–0.07)
Basophils Absolute: 0.1 K/uL (ref 0.0–0.1)
Basophils Relative: 0 %
Eosinophils Absolute: 0 K/uL (ref 0.0–0.5)
Eosinophils Relative: 0 %
HCT: 24.5 % — ABNORMAL LOW (ref 36.0–46.0)
Hemoglobin: 7.9 g/dL — ABNORMAL LOW (ref 12.0–15.0)
Immature Granulocytes: 1 %
Lymphocytes Relative: 20 %
Lymphs Abs: 3.6 K/uL (ref 0.7–4.0)
MCH: 26 pg (ref 26.0–34.0)
MCHC: 32.2 g/dL (ref 30.0–36.0)
MCV: 80.6 fL (ref 80.0–100.0)
Monocytes Absolute: 1 K/uL (ref 0.1–1.0)
Monocytes Relative: 5 %
Neutro Abs: 12.9 K/uL — ABNORMAL HIGH (ref 1.7–7.7)
Neutrophils Relative %: 74 %
Platelets: 524 K/uL — ABNORMAL HIGH (ref 150–400)
RBC: 3.04 MIL/uL — ABNORMAL LOW (ref 3.87–5.11)
RDW: 16.7 % — ABNORMAL HIGH (ref 11.5–15.5)
WBC: 17.6 K/uL — ABNORMAL HIGH (ref 4.0–10.5)
nRBC: 0 % (ref 0.0–0.2)

## 2020-11-21 LAB — BPAM RBC
Blood Product Expiration Date: 202209242359
ISSUE DATE / TIME: 202208221144
Unit Type and Rh: 5100

## 2020-11-21 LAB — I-STAT BETA HCG BLOOD, ED (MC, WL, AP ONLY): I-stat hCG, quantitative: <5 m[IU]/mL (ref <5)

## 2020-11-21 LAB — PROTIME-INR
INR: 1.4 — ABNORMAL HIGH (ref 0.8–1.2)
Prothrombin Time: 16.8 s — ABNORMAL HIGH (ref 11.4–15.2)

## 2020-11-21 LAB — TROPONIN I (HIGH SENSITIVITY): Troponin I (High Sensitivity): 5 ng/L (ref <18)

## 2020-11-21 LAB — APTT: aPTT: 40 seconds — ABNORMAL HIGH (ref 24–36)

## 2020-11-21 LAB — BRAIN NATRIURETIC PEPTIDE: B Natriuretic Peptide: 631.4 pg/mL — ABNORMAL HIGH (ref 0.0–100.0)

## 2020-11-21 LAB — PREPARE RBC (CROSSMATCH)

## 2020-11-21 MED ORDER — POTASSIUM CHLORIDE 10 MEQ/100ML IV SOLN
10.0000 meq | INTRAVENOUS | Status: DC
  Administered 2020-11-21: 10 meq via INTRAVENOUS

## 2020-11-21 MED ORDER — MORPHINE SULFATE 1 MG/ML IV SOLN PCA: INTRAVENOUS | Status: DC

## 2020-11-21 MED ORDER — POTASSIUM CHLORIDE 10 MEQ/100ML IV SOLN: 10.0000 meq | INTRAVENOUS | Status: AC

## 2020-11-21 MED ORDER — SODIUM CHLORIDE 0.9% FLUSH
3.0000 mL | Freq: Two times a day (BID) | INTRAVENOUS | Status: DC
  Administered 2020-11-21 – 2020-11-25 (×7): 3 mL via INTRAVENOUS

## 2020-11-21 MED ORDER — DIPHENHYDRAMINE HCL 12.5 MG/5ML PO ELIX
12.5000 mg | ORAL_SOLUTION | Freq: Four times a day (QID) | ORAL | Status: DC | PRN
Start: 2020-11-21 — End: 2020-11-21
  Filled 2020-11-21: qty 5

## 2020-11-21 MED ORDER — SODIUM CHLORIDE 0.9 % IV SOLN
2.0000 g | Freq: Once | INTRAVENOUS | Status: AC
  Administered 2020-11-21: 2 g via INTRAVENOUS
  Filled 2020-11-21: qty 2

## 2020-11-21 MED ORDER — POTASSIUM CHLORIDE 10 MEQ/100ML IV SOLN
10.0000 meq | INTRAVENOUS | Status: DC
Start: 2020-11-21 — End: 2020-11-21
  Administered 2020-11-21: 10 meq via INTRAVENOUS
  Filled 2020-11-21 (×2): qty 100

## 2020-11-21 MED ORDER — SODIUM CHLORIDE 0.9% IV SOLUTION: Freq: Once | INTRAVENOUS | Status: DC

## 2020-11-21 MED ORDER — DIPHENHYDRAMINE HCL 50 MG/ML IJ SOLN
12.5000 mg | Freq: Four times a day (QID) | INTRAMUSCULAR | Status: DC | PRN
Start: 2020-11-21 — End: 2020-11-21

## 2020-11-21 MED ORDER — NALOXONE HCL 0.4 MG/ML IJ SOLN
0.4000 mg | INTRAMUSCULAR | Status: DC | PRN
Start: 2020-11-21 — End: 2020-11-21

## 2020-11-21 MED ORDER — RIVAROXABAN 10 MG PO TABS
10.0000 mg | ORAL_TABLET | Freq: Every day | ORAL | Status: DC
  Administered 2020-11-21: 10 mg via ORAL
  Filled 2020-11-21: qty 1

## 2020-11-21 MED ORDER — IOHEXOL 350 MG/ML SOLN
56.0000 mL | Freq: Once | INTRAVENOUS | Status: AC | PRN
  Administered 2020-11-21: 56 mL via INTRAVENOUS

## 2020-11-21 MED ORDER — LACTATED RINGERS IV BOLUS
1000.0000 mL | Freq: Once | INTRAVENOUS | Status: AC
  Administered 2020-11-21: 1000 mL via INTRAVENOUS

## 2020-11-21 MED ORDER — SENNOSIDES-DOCUSATE SODIUM 8.6-50 MG PO TABS: 1.0000 | ORAL_TABLET | Freq: Every evening | ORAL | Status: DC | PRN

## 2020-11-21 MED ORDER — HYDROMORPHONE HCL 1 MG/ML IJ SOLN
1.0000 mg | Freq: Once | INTRAMUSCULAR | Status: AC
  Administered 2020-11-21: 1 mg via INTRAVENOUS
  Filled 2020-11-21: qty 1

## 2020-11-21 MED ORDER — ACETAMINOPHEN 325 MG PO TABS
650.0000 mg | ORAL_TABLET | Freq: Four times a day (QID) | ORAL | Status: DC | PRN
  Administered 2020-11-21: 650 mg via ORAL
  Filled 2020-11-21: qty 2

## 2020-11-21 MED ORDER — CEFAZOLIN SODIUM-DEXTROSE 2-4 GM/100ML-% IV SOLN
2.0000 g | Freq: Three times a day (TID) | INTRAVENOUS | Status: DC
  Administered 2020-11-21 – 2020-11-25 (×12): 2 g via INTRAVENOUS
  Filled 2020-11-21 (×15): qty 100

## 2020-11-21 MED ORDER — ENOXAPARIN SODIUM 100 MG/ML IJ SOSY
1.5000 mg/kg | PREFILLED_SYRINGE | INTRAMUSCULAR | Status: DC
  Filled 2020-11-21: qty 0.95

## 2020-11-21 MED ORDER — ACETAMINOPHEN 650 MG RE SUPP: 650.0000 mg | Freq: Four times a day (QID) | RECTAL | Status: DC | PRN

## 2020-11-21 MED ORDER — VANCOMYCIN HCL IN DEXTROSE 1-5 GM/200ML-% IV SOLN
1000.0000 mg | Freq: Once | INTRAVENOUS | Status: AC
  Administered 2020-11-21: 1000 mg via INTRAVENOUS
  Filled 2020-11-21: qty 200

## 2020-11-21 MED ORDER — POTASSIUM CHLORIDE CRYS ER 20 MEQ PO TBCR
40.0000 meq | EXTENDED_RELEASE_TABLET | ORAL | Status: AC
  Administered 2020-11-21 (×3): 40 meq via ORAL
  Filled 2020-11-21 (×4): qty 2

## 2020-11-21 MED ORDER — OXYCODONE HCL 5 MG PO TABS
5.0000 mg | ORAL_TABLET | ORAL | Status: DC | PRN
  Administered 2020-11-21 – 2020-11-23 (×5): 5 mg via ORAL
  Filled 2020-11-21 (×5): qty 1

## 2020-11-21 MED ORDER — SODIUM CHLORIDE 0.9% FLUSH: 9.0000 mL | INTRAVENOUS | Status: DC | PRN

## 2020-11-21 MED ORDER — ONDANSETRON HCL 4 MG/2ML IJ SOLN: 4.0000 mg | Freq: Four times a day (QID) | INTRAMUSCULAR | Status: DC | PRN

## 2020-11-21 MED ORDER — NICOTINE 14 MG/24HR TD PT24
14.0000 mg | MEDICATED_PATCH | Freq: Every day | TRANSDERMAL | Status: DC
  Filled 2020-11-21 (×5): qty 1

## 2020-11-21 NOTE — ED Notes (Signed)
Stroke documentation started in error.

## 2020-11-21 NOTE — ED Notes (Signed)
Patient transported to CT 

## 2020-11-21 NOTE — Progress Notes (Signed)
Patient arrived to MRI then refused exam. RN aware.

## 2020-11-21 NOTE — Progress Notes (Signed)
Diona Fanti, RN (Water quality scientist) and Prudence Davidson, RN at bedside. Pt gave verbal consent to search of personal belongings. Pt had one item of luggage, one book bag, and two plastic bags. Items found in bags: clothing, coloring items, books, and personal hygiene items. Home medications also found and will be sent to pharmacy for storage. Also, a lighter was found and has been placed in the pt's chart on the unit.

## 2020-11-21 NOTE — ED Notes (Signed)
Attempted report X2 

## 2020-11-21 NOTE — ED Notes (Signed)
Attempted report X1. Bed ready for 

## 2020-11-21 NOTE — H&P (Addendum)
Date: 11/21/2020               Patient Name:  Kathryn Medina MRN: [id number]  DOB: 10/23/1990 Age / Sex: 30 y.o., female   PCP: Pcp, No         Medical Service: Internal Medicine Teaching Service         Attending Physician: Dr. Inez CatalinaMullen, Emily B, MD    First Contact: Elza Rafterayann Atway, DO Pager: RA [phone]  Second Contact: Merrilyn PumaSagar Jinwala, MD Pager: Nada MaclachlanSJ [phone]       After Hours (After 5p/  First Contact Pager: [phone]  weekends / holidays): Second Contact Pager: [phone]   SUBJECTIVE  Chief Complaint: chest pain  History of Present Illness: Kathryn Medina is a 30 y.o. female with a pertinent PMH of polysubstance use/IV drug use, MSSA infective endocarditis of TV with multiple recent admissions for bacteremia with hx of leaving AMA, degenerative disc disease with lumbar radiculopathy, hepatitis C, who [facility] with chest pain and shortness of breath.   Of note, patient was recently admitted on 8/21 for sepsis secondary to tricuspid valve infective endocarditis in setting of MSSA bacteremia. She was given vancomycin, cefepime and flagyl and started on vancomycin and rocephin. However, prior to being able to obtain Echo, patient left AMA. Patient presented this morning with nonradiating substernal chest pain and dyspnea. She notes that it is difficult to take a deep breath. She endorses diffuse body aches with ongoing lower back pain that she attributes to her disc herniation. She denies any fevers/chills, abdominal pain, nausea/vomiting, or diarrhea. She notes that her foot pain is improved but now has pain at the palms of her hands. Last injection drug use was two days prior with IV fentanyl and heroin.   Patient has had multiple admissions for bacteremia starting 5/23, 5/25, 5/29, 6/6, 6/20, 8/9, and most recently, 8/21. She had first positive cultures growing MSSA on 5/23 with a tricuspid valve vegetation confirmed on TEE. Subsequent blood cultures have grown MSSA and Roseomonas. The  vegetation had grown on TTE on 8/8. She has a history of sepitic pulmonary emboli with multiple partially cavitary nodules on CTA 6/8. MRI lumbar spine 5/23 without evidence of infection at the time. Patient has a history of leaving AMA from almost all of her admissions prior to completing treatment for infective endocarditis.    Past Medical History:  Past Medical History:  Diagnosis Date   Asthma    does not use inhaler    Social:  Patient currently lives with her significant other and their mother. She is not currently working. She does have a daughter who [place] her [place] and is safe. She endorses smoking 1/2 ppd since age of 30. She denies any alcohol use but does endorse ongoing IV drug use with fentanyl and heroine. Last use was 2 days ago.   Family History: Family History  Problem Relation Age of Onset   Cancer Paternal Grandfather    Cancer Paternal Grandmother    Emphysema Maternal Grandmother    Hypertension Father    Cancer Father     Allergies: Allergies as of 11/21/2020 - Review Complete 11/21/2020  Allergen Reaction Noted   Other Hives 08/25/2016    Review of Systems: A complete ROS was negative except as per HPI.   OBJECTIVE:  Physical Exam: Blood pressure 102/71, pulse (!) 120, temperature 99.7 F (37.6 C), temperature source Oral, resp. rate (!) 24, height 5\' 8"  (1.727 m), weight 63.5 kg,  last menstrual period 11/16/2020, SpO2 93 %. Physical Exam  Constitutional: chronically ill appearing young female, appears intoxicated  HENT: Normocephalic and atraumatic, EOMI, conjunctiva normal, moist mucous membranes Cardiovascular: Tachycardic, regular rhythm, S1 and S2 present, no murmurs, rubs, gallops.  Distal pulses intact Respiratory: Mildly tachypneic. Lungs are clear to auscultation bilaterally. On room air GI: Nondistended, soft, nontender to palpation, normal bowel sounds Musculoskeletal: Normal bulk and tone.  No peripheral edema noted.  Bilateral palms with osler nodes Neurological: Is alert and oriented x4, no apparent focal deficits noted. Skin: Warm and dry.  No rash, erythema, lesions noted. Psychiatric: restless, answering questions appropriately   Pertinent Labs: CBC    Component Value Date/Time   WBC 17.6 (H) 11/21/2020 0602   RBC 3.04 (L) 11/21/2020 0602   HGB 7.9 (L) 11/21/2020 0602   HGB 10.8 (L) 06/18/2016 1110   HCT 24.5 (L) 11/21/2020 0602   HCT 32.9 (L) 06/18/2016 1110   PLT 524 (H) 11/21/2020 0602   PLT 206 06/18/2016 1110   MCV 80.6 11/21/2020 0602   MCV 97 06/18/2016 1110   MCH 26.0 11/21/2020 0602   MCHC 32.2 11/21/2020 0602   RDW 16.7 (H) 11/21/2020 0602   RDW 12.7 06/18/2016 1110   LYMPHSABS 3.6 11/21/2020 0602   LYMPHSABS 2.1 01/23/2016 1317   MONOABS 1.0 11/21/2020 0602   EOSABS 0.0 11/21/2020 0602   EOSABS 0.2 01/23/2016 1317   BASOSABS 0.1 11/21/2020 0602   BASOSABS 0.0 01/23/2016 1317     CMP     Component Value Date/Time   NA 131 (L) 11/21/2020 0602   K 2.7 (LL) 11/21/2020 0602   CL 95 (L) 11/21/2020 0602   CO2 25 11/21/2020 0602   GLUCOSE 127 (H) 11/21/2020 0602   BUN <5 (L) 11/21/2020 0602   CREATININE 0.63 11/21/2020 0602   CALCIUM 8.0 (L) 11/21/2020 0602   PROT 6.4 (L) 11/21/2020 0602   ALBUMIN 2.4 (L) 11/21/2020 0602   AST 19 11/21/2020 0602   ALT 10 11/21/2020 0602   ALKPHOS 80 11/21/2020 0602   BILITOT 0.5 11/21/2020 0602   GFRNONAA >60 11/21/2020 0602   GFRAA >60 11/18/2018 0021    Pertinent Imaging: CT Head Wo Contrast  Result Date: 11/21/2020 CLINICAL DATA:  30 year old female with altered mental status, "Blood infection". EXAM: CT HEAD WITHOUT CONTRAST TECHNIQUE: Contiguous axial images were obtained from the base of the skull through the vertex without intravenous contrast. COMPARISON:  None. FINDINGS: Brain: Normal cerebral volume. No midline shift, ventriculomegaly, mass effect, evidence of mass lesion, intracranial hemorrhage or evidence of cortically  based acute infarction. Gray-white matter differentiation is within normal limits throughout the brain. Vascular: No suspicious intracranial vascular hyperdensity. Skull: No acute osseous abnormality identified. Sinuses/Orbits: Opacified right middle ear and mastoids. No bone erosion is evident. Left middle ear, mastoids and paranasal sinuses are well aerated. Other: Mildly Disconjugate gaze. Otherwise negative orbit and scalp soft tissues. IMPRESSION: 1. Normal noncontrast CT appearance of the brain. 2. Opacified right middle ear and mastoids, consider Acute Otitis Media with mastoid effusion. No bone erosion is evident. Electronically Signed   By: Odessa Fleming M.D.   On: 11/21/2020 07:48   CT Angio Chest Pulmonary Embolism (PE) W or WO Contrast  Result Date: 11/21/2020 CLINICAL DATA:  Concern for pulmonary embolism. EXAM: CT ANGIOGRAPHY CHEST WITH CONTRAST TECHNIQUE: Multidetector CT imaging of the chest was performed using the standard protocol during bolus administration of intravenous contrast. Multiplanar CT image reconstructions and MIPs were obtained to evaluate the  vascular anatomy. CONTRAST:  56 mL OMNIPAQUE IOHEXOL 350 MG/ML SOLN COMPARISON:  None. FINDINGS: Vascular Findings: There is adequate opacification of the pulmonary arterial system with the main pulmonary artery measuring 274 Hounsfield units. There is a moderate-sized occlusive filling defect involving near the entirety of the right lower lobe pulmonary artery (axial image 67, series 6; coronal image 65, series 9) with preserved opacification of the right anterior and medial basilar segmental pulmonary arteries but with non opacification of the right posterior and lateral segmental arteries. There is an addition small occlusive filling defect involving the posterior basilar segmental artery of the left lower lobe (axial image 83, series 6; coronal image 84, series 9). Normal caliber of the main pulmonary artery. There is no definitive  intraventricular septal bowing to suggest right-sided heart failure. Cardiomegaly.  No pericardial effusion. No evidence of thoracic aortic aneurysm or dissection on this nongated examination. Conventional configuration of the aortic arch. Review of the MIP images confirms the above findings. ---------------------------------------------------------------------------------- Nonvascular Findings: Mediastinum/Lymph Nodes: Ill-defined soft tissue within the anterior mediastinum without associated mass effect and favored to represent residual thymic tissue. No definitive bulky mediastinal or hilar adenopathy though evaluation degraded secondary to lack mediastinal fat. No axillary lymphadenopathy. Lungs/Pleura: Bilateral nodular consolidative airspace opacities, worse within the right lower lobe with several of the nodular consolidative opacities demonstrating central cavitation worrisome for multifocal pneumonia, suggestive of septic pulmonary emboli. Trace bilateral pleural effusions. Minimal biapical paraseptal emphysematous change. Scattered air bronchograms are seen within the perihilar consolidation of the right lower lobe however the central pulmonary airways remain patent. No pneumothorax. Upper abdomen: Limited early arterial phase evaluation of the upper abdomen is unremarkable. Musculoskeletal: No acute or aggressive osseous abnormalities. Regional soft tissues appear normal. Normal appearance of the thyroid gland. IMPRESSION: 1. Bilateral nodular consolidative opacities, many of which demonstrate central cavitation worrisome for multifocal infection, specifically, septic pulmonary emboli. As this finding is associated with cardiomegaly, further evaluation with cardiac echo and/or TEE could be performed as indicated. 2. The examination is positive for pulmonary embolism involving the right lower lobe pulmonary artery as well as the left posterior basilar segmental pulmonary artery. Overall clot burden is  deemed small in volume and there is no CT evidence of right-sided heart strain. 3.  Emphysema (ICD10-J43.9). Electronically Signed   By: Simonne Come M.D.   On: 11/21/2020 10:16   DG Chest Port 1 View  Result Date: 11/21/2020 CLINICAL DATA:  31 year old female with sepsis.  Endocarditis. EXAM: PORTABLE CHEST 1 VIEW COMPARISON:  Chest x-ray 11/18/2020. FINDINGS: Worsening airspace consolidation in the right mid to lower lung, likely involving portions of both the right middle and lower lobes. Additional nodular opacity in the left mid lung. Small right pleural effusion. No pneumothorax. No evidence of pulmonary edema. Cardiac silhouette is enlarged. Upper mediastinal contours are within normal limits. IMPRESSION: 1. Worsening multilobar pneumonia, most severe in the right middle and lower lobes. 2. Nodular density in the left mid lung. Given the patient's history of endocarditis, the possibility of septic embolus should be considered. Electronically Signed   By: Trudie Reed M.D.   On: 11/21/2020 06:04    EKG: personally reviewed my interpretation is unchanged from previous tracings, sinus tachycardia, prolonged QT interval  ASSESSMENT & PLAN:  Assessment: Active Problems:   Infective endocarditis   Regina Ganci is a 30 y.o. 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 She presented with chest pain and shortness of breath. She has a history of septic pulmonary emboli. Repeat CTA with bilateral nodular  consolidative opacities with central cavitation, concerning for septic pulmonary emboli. Echo with 2cm tricuspid valve vegetation, similar to prior scan on 8/8 at OSH.  - Given dose of vanc and cefepime in ED - Continue IV Ancef 2g q8h for MSSA bacteremia - MRI Lumbar Spine ordered to evaluate for epidural abscess given ongoing back pain but patient refused to complete this test - Therapeutic dose lovenox - Trending CBC - Cardiac monitoring - F/u repeat blood cx   #Hypokalemia Potassium 2.9 in ED without evidence of EKG changes. Suspect in setting of acute illness with decreased oral intake. - Potassium repletion  - Monitor BMP  #Hx of iron deficiency anemia Hb stable 7-9. Was noted to have acute anemia to 6.7 on prior lab; however, this admission was 7.9. Has a history of iron deficiency anemia. - Trend CBC - Can consider iron transfusion once bacteremia resolves   #Opioid use disorder #Polysubstance use  Patient has a history of IV drug use with fentanyl, cocaine, heroine, methamphetamines. She also has a history of tobacco use and marijuana use. She reports last use was two days prior to admission. However, appears intoxicated during examination.  - PCA pump - Monitor for signs of acute withdrawal or intoxication - Narcan prn   Best Practice: Diet: Regular diet IVF: Fluids: None, Rate: None VTE: Lovenox Code: Full Status: Inpatient with expected length of stay greater than 2 midnights. Anticipated Discharge Location: Home Barriers to Discharge: Medical stability and Home enviroment access/layout  Signature: Eliezer Bottom, MD Internal Medicine Resident, PGY-3 [facility]  Pager: [phone] 3:25 PM, 11/21/2020   Please contact the on call pager after 5 pm and on weekends at [phone].

## 2020-11-21 NOTE — Progress Notes (Signed)
   11/21/20 2011  Assess: MEWS Score  BP 115/77  Pulse Rate (!) 126  ECG Heart Rate (!) 127  SpO2 95 %  Assess: MEWS Score  MEWS Temp 0  MEWS Systolic 0  MEWS Pulse 2  MEWS RR 0  MEWS LOC 0  MEWS Score 2  MEWS Score Color Yellow  Assess: if the MEWS score is Yellow or Red  Were vital signs taken at a resting state? Yes  Focused Assessment Change from prior assessment (see assessment flowsheet)  Early Detection of Sepsis Score *See Row Information* Low  MEWS guidelines implemented *See Row Information* Yes  Treat  MEWS Interventions Administered scheduled meds/treatments  Take Vital Signs  Increase Vital Sign Frequency  Yellow: Q 2hr X 2 then Q 4hr X 2, if remains yellow, continue Q 4hrs  Escalate  MEWS: Escalate Yellow: discuss with charge nurse/RN and consider discussing with provider and RRT  Notify: Charge Nurse/RN  Name of Charge Nurse/RN Notified Christina, RN  Date Charge Nurse/RN Notified 11/21/20  Time Charge Nurse/RN Notified 2100  Document  Patient Outcome Not stable and remains on department  Progress note created (see row info) Yes

## 2020-11-21 NOTE — Progress Notes (Signed)
Echocardiogram complete.  Jefferie Holston RDCS  

## 2020-11-21 NOTE — Progress Notes (Signed)
Pt's HR sustaining in the 130's, BP 108/77, MAP 89. Hanley Ben, MD made aware. Will continue to monitor.  Bari Edward, RN

## 2020-11-21 NOTE — Progress Notes (Signed)
Paged for HR sustaining in the 130's. HR has been fluctuating all night, will be in 120s-130s then go down to 60s-70s at times per RN. BP 100s-110s systolic. MAP 89. Temp has been up to 100 recently. RN recently gave one-time oral Oxy for patient reporting 8 out of 10 chest pain, which has been stable she was admitted here.  Patient also given IV potassium but refused her Lovenox.   Evaluated the patient who is lying comfortably in bed.  Heart rate sustaining in the 130s, BP 108 systolic on tele. Patient denies any chest pain or shortness of breath currently.  CV: Tachycardic, regular rhythm, pulses intact in UE  EKG: Questionable SVT versus sinus tach.  Similar to EKG from 8/21 showing sinus tachycardia.  A/P: Overall likely reflective of her PE in the setting of patient refusing her Lovenox. Continue to monitor.

## 2020-11-21 NOTE — Progress Notes (Signed)
ANTICOAGULATION CONSULT NOTE - Initial Consult  Pharmacy Consult for lovenox Indication: pulmonary embolus  Allergies  Allergen Reactions   Other Hives    pork    Patient Measurements: Height: 5\' 8"  (172.7 cm) Weight: 63.5 kg (140 lb) IBW/kg (Calculated) : 63.9  Vital Signs: Temp: 99.7 F (37.6 C) (08/23 0538) Temp Source: Oral (08/23 0538) BP: 102/71 (08/23 1430) Pulse Rate: 120 (08/23 1430)  Labs: Recent Labs    11/18/20 2340 11/19/20 0551 11/21/20 0602 11/21/20 0640  HGB 8.3* 6.8* 7.9*  --   HCT 25.2* 21.1* 24.5*  --   PLT 541* 357 524*  --   APTT 38*  --  40*  --   LABPROT 15.8* 16.9* 16.8*  --   INR 1.3* 1.4* 1.4*  --   CREATININE 0.76 0.77 0.63  --   TROPONINIHS  --   --   --  5   Estimated Creatinine Clearance: 103.1 mL/min (by C-G formula based on SCr of 0.63 mg/dL).  Medical History: Past Medical History:  Diagnosis Date   Asthma    does not use inhaler   Assessment: 30 yo F - dx with recent MSSA bacteremia few days ago, left AMA and returned with SOB, intermittent CP and palpitations. Found to have PE on CTA chest, small clot burden without elevated RV:LV. No AC PTA.   Goal of Therapy:  Monitor platelets by anticoagulation protocol: Yes   Plan:  Lovenox 95mg  (1.5mg /kg) SQ daily -Monitor daily CBC and any s/sx of bleeding  26, PharmD, [facility] Emergency Medicine Clinical Pharmacist ED RPh Phone: [phone] Main RX: [phone]

## 2020-11-21 NOTE — ED Triage Notes (Addendum)
Patient here with chest pain and shortness of breath.  Patient has known endocarditis, left hospital AMA yesterday.  Patient also with low blood count per patient.  Patient is Covid + on 11/19/20.  Patient is unable to look at providers, eyelids are mostly closed and she is writhing in triage chair.  She is unable to complete sentences and thoughts.

## 2020-11-21 NOTE — ED Notes (Signed)
Pt not in any respiratory distress at this time. Pt complaining of pain.

## 2020-11-21 NOTE — Progress Notes (Signed)
Upon admission to unit pt is very tired. Responsive to voice, but falls asleep not long after waking up. Unable to complete admission questions. Internal medicine has been contacted for concerns with starting PCA pump.

## 2020-11-21 NOTE — ED Notes (Signed)
Pt refusing last 2 doses of K+. Pt complains of burning. Pt had turn off IV pump running fluids. This RN explained she should not tamper with pumps. Pt stated she didn't care and does not want any more of that "shit"

## 2020-11-21 NOTE — Plan of Care (Signed)
Thayer Inabinet is a 30 year old female who was readmitted earlier today for medical management of tricuspid endocarditis secondary to MSSA.  She was recently admitted to our service on 8/19 for this however left AMA after concerns of her boyfriend providing her illicit substances, due to acute changes in her mental status, led to him being banned from her room.  Earlier, on admission today, while still in the ED, she was noted to have an acute change in her mental status again while her boyfriend was visiting.  On speaking with the ER nurse who was providing care for her earlier today, her boyfriend reported her having a knife in one of her bags that she has brought with her.   Paged to bedside by nurse for reevaluation of her mental status.  She had recently been moved to 6 E.  Her bedside RN expressed concerns about starting the PCA pump given her lethargy. I evaluated the patient at bedside.  I agree with the nursing assessment.  On entering her room, she appeared to be sleeping comfortably in bed.  She is tachycardic with heart rate in the 120s and blood pressures in the 90s over 60s.  She is lethargic, but opens her eyes to voice.  Responses are sluggish but she seems oriented.  When I asked her if she is in pain, she responded all over, but was unable to localize the pain in any way.  While trying to talk with her, she kept falling back asleep. Of additional note, she has declined treatment of her hypokalemia and is currently not letting the nurse perform an initial assessment upon arrival to the unit. She has declined MRI of her L-spine.  Assessment: I agree with her nurses concerns about starting the PCA pump with her current mental status.  I am also concerned about the effect that opioids may have on her hemodynamically.  Her maps are stable right now but her heart rate has been rising which I suspect will be followed by a decrease in blood pressure without intervention.  I strongly suspect that her  boyfriend provided her with an illicit substance in the emergency department when he was visiting earlier today.    Plan: For right now, I think we need to focus on ensuring hemodynamic stability and fixing her electrolyte derangements.  She is not protraying signs of opioid withdrawal. If her mental status improves, we can start some oral oxycodone for treatment of her pain. I do suspect she may need higher doses due to her recreational opioid use, but will need to facilitate safety while doing so. -1L LR bolus. Monitor volume status closely with known endocarditis -5mg  oxycodone - potassium tablets q2h x3 doses.  Her boyfriend is not allowed to visit her in person.  If they wish to speak, they can certainly use the telephone.    , MD Internal Medicine Resident PGY-3 [facility] Pager: [phone] 11/21/2020 6:20 PM

## 2020-11-21 NOTE — ED Provider Notes (Signed)
Emergency Medicine Provider Triage Evaluation Note  Kathryn Medina , a 30 y.o. female  was evaluated in triage.  Pt complains of chest pain and SOB. Pt with a hx of IVDU and known endocarditis with + blood cultures.  Was admitted on 8/21 and left AMA the same day.  Pt also with anemia and needs a blood transfusion.  Pt is also COVID +.   Review of Systems  Positive: Chest pain, SOB Negative: N/V, abd pain  Physical Exam  BP 104/78 (BP Location: Right Arm)   Pulse (!) 139   Temp 99.7 F (37.6 C) (Oral)   Resp 19   LMP 11/16/2020   SpO2 93%  Gen:   Awake, no distress, uncomfortable appearing Resp:  Normal effort, tachypnea MSK:   Moves extremities without difficulty  Other:  Under the influence, requires stimulation and redirection, significant tachycardia   Medical Decision Making  Medically screening exam initiated at 5:42 AM.  Appropriate orders placed.  Kathryn Medina was informed that the remainder of the evaluation will be completed by another provider, this initial triage assessment does not replace that evaluation, and the importance of remaining in the ED until their evaluation is complete.  IVDU, endocarditis, + blood cultures, + COVID.  Sepsis work-up initiated.  Anticipate admission.   Kathryn Medina, Kathryn Medina 11/21/20 0545    Tilden Fossa, MD 11/21/20 [id number]

## 2020-11-21 NOTE — ED Notes (Addendum)
Pt boyfriend at bedside. Boyfriend was informed he could not bring anything back to room, including water, water flavor packets or backpack. Boyfriend verbalized understanding. Security made aware as well as CN. Pt came to room and immediately went to restroom. Security called to check on boyfriend

## 2020-11-21 NOTE — Progress Notes (Addendum)
Internal medicine attending came to bedside. Advised that provider will discontinue order for PCA pump. Director Thayer Ohm, RN and Arsenio Loader, RN also at bedside.

## 2020-11-21 NOTE — ED Notes (Signed)
Patient transported to MRI 

## 2020-11-21 NOTE — TOC Initial Note (Signed)
Transition of Care [facility]) - Initial/Assessment Note    Patient Details  Name: Kathryn Medina MRN: [id number] Date of Birth: 13-Oct-1990  Transition of Care [facility]) CM/SW Contact:    Lockie Pares, RN Phone Number: 11/21/2020, 4:51 PM  Clinical Narrative:                  One of multiple admissions for endocarditis inthis 30 year old female. She has positive urine for amphetamines, THC and cocaine. She is uninsured and needs Enoxiprin post discharge, due to pulmonary emboli. She is currently on a PCA for comfort. Recommended PCP to establish primary care. ( In patient instructions). Will likely need medication assistance, however has utilized [facility] previously. CM will follow for needs and transitions.  Expected Discharge Plan: Home/Self Care Barriers to Discharge: Inadequate or no insurance, ED Active Substance Abuse   Patient Goals and CMS Choice        Expected Discharge Plan and Services Expected Discharge Plan: Home/Self Care                                              Prior Living Arrangements/Services     Patient language and need for interpreter reviewed:: Yes        Need for Family Participation in Patient Care: Yes (Comment) Care giver support system in place?: Yes (comment)   Criminal Activity/Legal Involvement Pertinent to Current Situation/Hospitalization: No - Comment as needed  Activities of Daily Living      Permission Sought/Granted                  Emotional Assessment       Orientation: : Oriented to Self, Oriented to Place, Oriented to Situation Alcohol / Substance Use: Illicit Drugs, Alcohol Use Psych Involvement: No (comment)  Admission diagnosis:  Bacteremia [R78.81] Infective endocarditis [I33.0] Hypokalemia [E87.6] Multifocal pneumonia [J18.9] Anemia, unspecified type [D64.9] Patient Active Problem List   Diagnosis Date Noted   Infective endocarditis 11/21/2020   Acute infective endocarditis 11/19/2020   COVID-19     Hypokalemia    Polysubstance abuse (HCC)    Sepsis (HCC)    Opioid dependence with opioid-induced mood disorder (HCC)    Alcohol-induced mood disorder (HCC)    Non-reassuring fetal heart tones complicating pregnancy, antepartum 08/27/2016   Back pain affecting pregnancy 04/16/2016   Drug abuse during pregnancy (HCC) 03/19/2016   Supervision of normal first pregnancy 01/23/2016   PCP:  Pcp, No Pharmacy:   [facility] [address] [place], [place] - [address] [place] [address] Phone: [phone] Fax: [phone]     Social Determinants of Health (SDOH) Interventions    Readmission Risk Interventions No flowsheet data found.

## 2020-11-21 NOTE — ED Notes (Signed)
Pt refused MRI MD made aware.

## 2020-11-21 NOTE — ED Notes (Signed)
Pt behavior changed. Sitting up, with food in hand . Pt looks as if she is falling asleep. Appears to be similar to a "herion nod". MD paged.
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
## 2020-11-22 DIAGNOSIS — I330 Acute and subacute infective endocarditis: Secondary | ICD-10-CM

## 2020-11-22 DIAGNOSIS — J189 Pneumonia, unspecified organism: Secondary | ICD-10-CM

## 2020-11-22 DIAGNOSIS — B9561 Methicillin susceptible Staphylococcus aureus infection as the cause of diseases classified elsewhere: Secondary | ICD-10-CM

## 2020-11-22 DIAGNOSIS — R7881 Bacteremia: Secondary | ICD-10-CM

## 2020-11-22 MED ORDER — LACTATED RINGERS IV BOLUS
1000.0000 mL | Freq: Once | INTRAVENOUS | Status: AC
  Administered 2020-11-22: 1000 mL via INTRAVENOUS

## 2020-11-22 MED ORDER — RIVAROXABAN 15 MG PO TABS
15.0000 mg | ORAL_TABLET | Freq: Two times a day (BID) | ORAL | Status: DC
  Administered 2020-11-22 – 2020-11-23 (×2): 15 mg via ORAL
  Filled 2020-11-22 (×2): qty 1

## 2020-11-22 MED ORDER — SODIUM CHLORIDE 0.9 % IV SOLN
100.0000 mg | Freq: Every day | INTRAVENOUS | Status: DC
  Administered 2020-11-23: 100 mg via INTRAVENOUS
  Filled 2020-11-22: qty 20

## 2020-11-22 MED ORDER — RIVAROXABAN 20 MG PO TABS: 20.0000 mg | ORAL_TABLET | Freq: Every day | ORAL | Status: DC

## 2020-11-22 MED ORDER — SODIUM CHLORIDE 0.9 % IV SOLN
200.0000 mg | Freq: Once | INTRAVENOUS | Status: AC
  Administered 2020-11-22: 200 mg via INTRAVENOUS
  Filled 2020-11-22: qty 40

## 2020-11-22 NOTE — Progress Notes (Signed)
   11/22/20 2120  Assess: MEWS Score  Temp 98.2 F (36.8 C)  BP 90/65  Pulse Rate (!) 136  ECG Heart Rate (!) 132  Resp 20  Level of Consciousness Alert  SpO2 98 %  O2 Device Room Air  Patient Activity (if Appropriate) In bed  Assess: MEWS Score  MEWS Temp 0  MEWS Systolic 1  MEWS Pulse 3  MEWS RR 0  MEWS LOC 0  MEWS Score 4  MEWS Score Color Red  Assess: if the MEWS score is Yellow or Red  Were vital signs taken at a resting state? Yes  Focused Assessment Change from prior assessment (see assessment flowsheet)  Early Detection of Sepsis Score *See Row Information* Low  MEWS guidelines implemented *See Row Information* Yes  Treat  MEWS Interventions Escalated (See documentation below)  Take Vital Signs  Increase Vital Sign Frequency  Red: Q 1hr X 4 then Q 4hr X 4, if remains red, continue Q 4hrs  Escalate  MEWS: Escalate Red: discuss with charge nurse/RN and provider, consider discussing with RRT  Notify: Charge Nurse/RN  Name of Charge Nurse/RN Notified Shanell, RN  Date Charge Nurse/RN Notified 11/22/20  Time Charge Nurse/RN Notified 2140  Notify: Provider  Provider Name/Title Bonanno  Date Provider Notified 11/22/20  Time Provider Notified 2146  Notification Type Page  Notification Reason Change in status  Provider response Other (Comment) (Continue to monitor, possibly start new fluid order)  Date of Provider Response 11/22/20  Time of Provider Response 2150  Document  Patient Outcome Not stable and remains on department  Progress note created (see row info) Yes

## 2020-11-22 NOTE — Progress Notes (Signed)
     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  The transthoracic echocardiogram was reviewed.  She does have a mobile vegetation on the valve leaflet.  It does appear to traverse the valve and has a ventricular component.  That being said there is about 2 cm that is on the atrial side.  Additionally she does have some mild to moderate mitral valve regurgitation.  Recommendations: Transesophageal echocardiogram to rule out mitral valve endocarditis as well.  If this is negative then she would be a candidate for angio vac debridement of the tricuspid valve.  Arlene Brickel Keane Scrape

## 2020-11-22 NOTE — Consult Note (Signed)
[facility]    Date of Admission:  11/21/2020   Total days of antibiotics 2        Ancef        Vancomycin stopped 11/21/2020        Cefepime stopped 11/21/2020       Reason for Consult: Tricuspid valve endocarditis with MSSA bacteremia   Referring Provider: Debe Coder, MD Primary Care Provider: None  Assessment: Kathryn Medina is a 30 y.o. 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 Admitted for septic pulmonary emboli on CTA and currently on Ancef and xarelto. She has had multiple admissions for TV endocarditis and MSSA bacteremia since 5/25. First noted to have a 1x0.9x0.8 cm TV vegetation on echo from 5/27. Negative lumbar MRI at that time for back pain. Has unfortunately left AMA multiple times on admission from 5/25, 5/29, 6/6, 6/30, 8/8 and 8/21. Noted to have increase size of TV vegetation on echo on 8/8.   She has been afebrile since admission. Leukocytosis of 17.6. Bilateral pulmonary emboli on CTA on 11/21/2020, TTE with 2 cm TV vegetation with severe tricuspid regurgitation. Received once dose of vanc and cefepime in the ED prior to transition to ancef. Blood cultures on admission with gram postive cocci in clusters. Cultures from 11/19/2020 with MSSA. ID consulted for MSSA bacteremia with TV infective endocarditis and septic pulmonary emboli.  She was evaluated by CT surgery, TEE scheduled for 8/26 to evaluate MV for vegetations and angiovac if negative. Patient refused lumbar MRI on admission, discussed with her this afternoon and possibility of infection causing her back pain, patient reluctantly agreeable to MRI at this time. Will continue on current antibiotics at this time. She has no plan on leaving hospital this afternoon, however given history of multiple admission patient has a  high likelihood of leaving AMA, could consider Oritavancin if patient expressing desire to leave AMA.   Plan: Continue cefazolin 2g every 8 hours MRI lumbar back if patient agreeable TEE on 05/27/2020 Follow up CT surgery recommendations   Active Problems:   Infective endocarditis    sodium chloride   Intravenous Once   nicotine  14 mg Transdermal Daily   Rivaroxaban  15 mg Oral BID WC   Followed by   Melene Muller ON 12/13/2020] rivaroxaban  20 mg Oral Q supper   sodium chloride flush  3 mL Intravenous Q12H    HPI: Kathryn Medina is a 30 y.o. 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 Of notes patient has had multiple admission for TV endocarditis and MSSA bacteremia since 5/25. Noted to have an 1x0.9x0.8cm TV vegetation  since echo on 5/27 as well as negative lumbar MRI. Unfortunately has left AMA during prior to finishing antibiotic therapy for admission on 5/25, 5/29, 6/6, 6/30, 8/8 and 8/21. Noted to have increase in TV vegetation on echo on 8/8. On this admission found to have similar 2 cm TV vegetation on TTE. Admitted for septic pulmonary emboli and currently on Ancef and xarelto. She was evaluated by CT surgery, TEE scheduled for 8/26 to evaluate MV for vegetations and angiovac if negative. Patient states she is currently have lower back pain and notes  generalized body aches. Otherwise denies fever chills, shortness of breath, or palpitations.   Review of Systems: Review of Systems  Constitutional:  Negative for chills and fever.  HENT:  Negative for congestion and sore throat.   Eyes:  Negative for pain and discharge.  Respiratory:  Negative for cough and shortness of breath.   Cardiovascular:  Negative for chest pain, palpitations and leg swelling.  Gastrointestinal:  Negative for abdominal pain, nausea and vomiting.   Genitourinary:  Negative for frequency and urgency.  Musculoskeletal:  Positive for back pain and myalgias. Negative for joint pain and neck pain.  Skin:  Negative for itching and rash.  Neurological:  Negative for sensory change and focal weakness.   Past Medical History:  Diagnosis Date   Asthma    does not use inhaler    Social History   Tobacco Use   Smoking status: Every Day    Packs/day: 0.50    Years: 10.00    Pack years: 5.00    Types: Cigarettes   Smokeless tobacco: Never   Tobacco comments:    pt is trying to use the electronic cigarette  Vaping Use   Vaping Use: Never used  Substance Use Topics   Alcohol use: Yes    Comment: social, 2 drinks/day   Drug use: Yes    Types: Oxycodone, Marijuana, Heroin, Cocaine, Amphetamines, Fentanyl    Family History  Problem Relation Age of Onset   Cancer Paternal Grandfather    Cancer Paternal Grandmother    Emphysema Maternal Grandmother    Hypertension Father    Cancer Father    Allergies  Allergen Reactions   Other Hives    pork    OBJECTIVE: Blood pressure 98/60, pulse (!) 124, temperature 98.2 F (36.8 C), temperature source Oral, resp. rate 19, height 5\' 8"  (1.727 m), weight 63.5 kg, last menstrual period 11/16/2020, SpO2 97 %.  Physical Exam Constitutional:      Appearance: She is well-developed. She is ill-appearing.  HENT:     Head: Normocephalic and atraumatic.  Cardiovascular:     Rate and Rhythm: Regular rhythm. Tachycardia present.     Pulses:          Radial pulses are 2+ on the right side and 2+ on the left side.       Dorsalis pedis pulses are 2+ on the right side and 2+ on the left side.     Heart sounds: No murmur heard.   No friction rub.  Pulmonary:     Effort: Pulmonary effort is normal.     Breath sounds: Rhonchi (bibasilar) present.  Abdominal:     General: Bowel sounds are normal. There is no abdominal bruit.     Palpations: Abdomen is soft.  Musculoskeletal:     Right lower  leg: No edema.     Left lower leg: No edema.     Comments: TTP of the lumbar area  Skin:    General: Skin is warm and dry.     Capillary Refill: Capillary refill takes less than 2 seconds.     Findings: No rash.  Neurological:     General: No focal deficit present.     Mental Status: She is alert and oriented to person, place, and time.    Lab Results Lab Results  Component Value Date   WBC 17.6 (H) 11/21/2020   HGB 7.9 (L) 11/21/2020   HCT 24.5 (L) 11/21/2020   MCV 80.6 11/21/2020   PLT 524 (H) 11/21/2020  Lab Results  Component Value Date   CREATININE 0.63 11/21/2020   BUN <5 (L) 11/21/2020   NA 131 (L) 11/21/2020   K 2.7 (LL) 11/21/2020   CL 95 (L) 11/21/2020   CO2 25 11/21/2020    Lab Results  Component Value Date   ALT 10 11/21/2020   AST 19 11/21/2020   ALKPHOS 80 11/21/2020   BILITOT 0.5 11/21/2020     Microbiology: Recent Results (from the past 240 hour(s))  Culture, blood (Routine x 2)     Status: Abnormal (Preliminary result)   Collection Time: 11/18/20 11:30 PM   Specimen: BLOOD  Result Value Ref Range Status   Specimen Description BLOOD LEFT ARM  Final   Special Requests   Final    BOTTLES DRAWN AEROBIC AND ANAEROBIC Blood Culture results may not be optimal due to an excessive volume of blood received in culture bottles   Culture  Setup Time   Final    GRAM POSITIVE COCCI IN CLUSTERS IN BOTH AEROBIC AND ANAEROBIC BOTTLES CRITICAL VALUE NOTED.  VALUE IS CONSISTENT WITH PREVIOUSLY REPORTED AND CALLED VALUE.    Culture (A)  Final    STAPHYLOCOCCUS AUREUS SUSCEPTIBILITIES PERFORMED ON PREVIOUS CULTURE WITHIN THE LAST 5 DAYS. Performed at [facility], [address] [place], [place] [address]    Report Status PENDING  Incomplete  Culture, blood (Routine x 2)     Status: Abnormal   Collection Time: 11/18/20 11:40 PM   Specimen: BLOOD  Result Value Ref Range Status   Specimen Description BLOOD RIGHT ARM  Final   Special Requests   Final     BOTTLES DRAWN AEROBIC ONLY Blood Culture adequate volume   Culture  Setup Time   Final    GRAM POSITIVE COCCI IN CLUSTERS AEROBIC BOTTLE ONLY CRITICAL RESULT CALLED TO, READ BACK BY AND VERIFIED WITH: PHARM D J.MILLEN ON [id number] AT 1744 BY E.PARRISH Performed at [facility], [address] [place], [place] [address]    Culture STAPHYLOCOCCUS AUREUS (A)  Final   Report Status 11/21/2020 FINAL  Final   Organism ID, Bacteria STAPHYLOCOCCUS AUREUS  Final      Susceptibility   Staphylococcus aureus - MIC*    CIPROFLOXACIN <=0.5 SENSITIVE Sensitive     ERYTHROMYCIN <=0.25 SENSITIVE Sensitive     GENTAMICIN <=0.5 SENSITIVE Sensitive     OXACILLIN <=0.25 SENSITIVE Sensitive     TETRACYCLINE <=1 SENSITIVE Sensitive     VANCOMYCIN 2 SENSITIVE Sensitive     TRIMETH/SULFA <=10 SENSITIVE Sensitive     CLINDAMYCIN <=0.25 SENSITIVE Sensitive     RIFAMPIN <=0.5 SENSITIVE Sensitive     Inducible Clindamycin NEGATIVE Sensitive     * STAPHYLOCOCCUS AUREUS  Blood Culture ID Panel (Reflexed)     Status: Abnormal   Collection Time: 11/18/20 11:40 PM  Result Value Ref Range Status   Enterococcus faecalis NOT DETECTED NOT DETECTED Final   Enterococcus Faecium NOT DETECTED NOT DETECTED Final   Listeria monocytogenes NOT DETECTED NOT DETECTED Final   Staphylococcus species DETECTED (A) NOT DETECTED Final    Comment: CRITICAL RESULT CALLED TO, READ BACK BY AND VERIFIED WITH: PHARM D J.MILLEN ON [id number] AT 1744 BY E.PARRISH    Staphylococcus aureus (BCID) DETECTED (A) NOT DETECTED Final    Comment: CRITICAL RESULT CALLED TO, READ BACK BY AND VERIFIED WITH: PHARM D J.MILLEN ON [id number] AT 1744 BY E.PARRISH    Staphylococcus epidermidis NOT DETECTED NOT DETECTED Final   Staphylococcus lugdunensis NOT DETECTED  NOT DETECTED Final   Streptococcus species NOT DETECTED NOT DETECTED Final   Streptococcus agalactiae NOT DETECTED NOT DETECTED Final   Streptococcus pneumoniae NOT DETECTED NOT DETECTED  Final   Streptococcus pyogenes NOT DETECTED NOT DETECTED Final   A.calcoaceticus-baumannii NOT DETECTED NOT DETECTED Final   Bacteroides fragilis NOT DETECTED NOT DETECTED Final   Enterobacterales NOT DETECTED NOT DETECTED Final   Enterobacter cloacae complex NOT DETECTED NOT DETECTED Final   Escherichia coli NOT DETECTED NOT DETECTED Final   Klebsiella aerogenes NOT DETECTED NOT DETECTED Final   Klebsiella oxytoca NOT DETECTED NOT DETECTED Final   Klebsiella pneumoniae NOT DETECTED NOT DETECTED Final   Proteus species NOT DETECTED NOT DETECTED Final   Salmonella species NOT DETECTED NOT DETECTED Final   Serratia marcescens NOT DETECTED NOT DETECTED Final   Haemophilus influenzae NOT DETECTED NOT DETECTED Final   Neisseria meningitidis NOT DETECTED NOT DETECTED Final   Pseudomonas aeruginosa NOT DETECTED NOT DETECTED Final   Stenotrophomonas maltophilia NOT DETECTED NOT DETECTED Final   Candida albicans NOT DETECTED NOT DETECTED Final   Candida auris NOT DETECTED NOT DETECTED Final   Candida glabrata NOT DETECTED NOT DETECTED Final   Candida krusei NOT DETECTED NOT DETECTED Final   Candida parapsilosis NOT DETECTED NOT DETECTED Final   Candida tropicalis NOT DETECTED NOT DETECTED Final   Cryptococcus neoformans/gattii NOT DETECTED NOT DETECTED Final   Meth resistant mecA/C and MREJ NOT DETECTED NOT DETECTED Final    Comment: Performed at [facility], [address] [place], [place] [address]  Resp Panel by RT-PCR (Flu A&B, Covid)     Status: Abnormal   Collection Time: 11/19/20 12:32 AM   Specimen: Nasopharyngeal(NP) swabs in vial transport medium  Result Value Ref Range Status   SARS Coronavirus 2 by RT PCR POSITIVE (A) NEGATIVE Final    Comment: RESULT CALLED TO, READ BACK BY AND VERIFIED WITH: B MERRICK,RN@0207  11/19/20 MKELLY (NOTE) SARS-CoV-2 target nucleic acids are DETECTED.  The SARS-CoV-2 RNA is generally detectable in upper respiratory specimens during the acute  phase of infection. Positive results are indicative of the presence of the identified virus, but do not rule out bacterial infection or co-infection with other pathogens not detected by the test. Clinical correlation with patient history and other diagnostic information is necessary to determine patient infection status. The expected result is Negative.  Fact Sheet for Patients: BloggerCourse.com  Fact Sheet for Healthcare Providers: SeriousBroker.it  This test is not yet approved or cleared by the [place] FDA and  has been authorized for detection and/or diagnosis of SARS-CoV-2 by FDA under an Emergency Use Authorization (EUA).  This EUA will remain in effect (meaning this test can be  used) for the duration of  the COVID-19 declaration under Section 564(b)(1) of the Act, 21 U.S.C. section 360bbb-3(b)(1), unless the authorization is terminated or revoked sooner.     Influenza A by PCR NEGATIVE NEGATIVE Final   Influenza B by PCR NEGATIVE NEGATIVE Final    Comment: (NOTE) The Xpert Xpress SARS-CoV-2/FLU/RSV plus assay is intended as an aid in the diagnosis of influenza from Nasopharyngeal swab specimens and should not be used as a sole basis for treatment. Nasal washings and aspirates are unacceptable for Xpert Xpress SARS-CoV-2/FLU/RSV testing.  Fact Sheet for Patients: BloggerCourse.com  Fact Sheet for Healthcare Providers: SeriousBroker.it  This test is not yet approved or cleared by the [place] FDA and has been authorized for detection and/or diagnosis of SARS-CoV-2 by FDA under an Emergency  Use Authorization (EUA). This EUA will remain in effect (meaning this test can be used) for the duration of the COVID-19 declaration under Section 564(b)(1) of the Act, 21 U.S.C. section 360bbb-3(b)(1), unless the authorization is terminated or revoked.  Performed at [facility], [address], [place], [place] [address]   Urine Culture     Status: Abnormal   Collection Time: 11/19/20 12:55 AM   Specimen: In/Out Cath Urine  Result Value Ref Range Status   Specimen Description IN/OUT CATH URINE  Final   Special Requests   Final    Immunocompromised Performed at [facility], [address], [place], [place] [address]    Culture MULTIPLE SPECIES PRESENT, SUGGEST RECOLLECTION (A)  Final   Report Status 11/20/2020 FINAL  Final  Blood Culture (routine x 2)     Status: None (Preliminary result)   Collection Time: 11/21/20  6:03 AM   Specimen: BLOOD  Result Value Ref Range Status   Specimen Description BLOOD RIGHT ARM  Final   Special Requests   Final    BOTTLES DRAWN AEROBIC AND ANAEROBIC Blood Culture results may not be optimal due to an excessive volume of blood received in culture bottles   Culture   Final    GRAM POSITIVE COCCI IN CLUSTERS CULTURE REINCUBATED FOR BETTER GROWTH Performed at [facility], [address], [place], [place] [address]    Report Status PENDING  Incomplete  Blood Culture (routine x 2)     Status: None (Preliminary result)   Collection Time: 11/21/20  6:03 AM   Specimen: BLOOD RIGHT HAND  Result Value Ref Range Status   Specimen Description BLOOD RIGHT HAND  Final   Special Requests   Final    BOTTLES DRAWN AEROBIC ONLY Blood Culture adequate volume   Culture  Setup Time   Final    GRAM POSITIVE COCCI IN CLUSTERS AEROBIC BOTTLE ONLY    Culture   Final    CULTURE REINCUBATED FOR BETTER GROWTH Performed at [facility], [address] [place], [place] [address]    Report Status PENDING  Incomplete   Quincy Simmonds, MD Internal Medicine, PGY-2 Pager [phone] 11/22/2020 2:40 PM

## 2020-11-22 NOTE — Progress Notes (Signed)
Pt refusing labs this AM.

## 2020-11-22 NOTE — Progress Notes (Signed)
Nutrition Brief Note  Patient identified on the Malnutrition Screening Tool (MST) Report  Wt Readings from Last 15 Encounters:  11/21/20 63.5 kg  11/18/20 55.3 kg  09/24/17 56.2 kg  01/08/17 65.1 kg  08/27/16 73.9 kg  08/22/16 72.8 kg  08/15/16 73 kg  08/08/16 72 kg  08/01/16 70.4 kg  07/25/16 72.6 kg  07/18/16 72.6 kg  07/03/16 73 kg  06/18/16 70.8 kg  05/14/16 70.4 kg  04/16/16 68.5 kg   Kathryn Medina is a 30 y.o. 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   Reviewed I/O's: +920 ml x 24 hours  Pt very somnolent at time of visit. She did not respond to voice or touch.   Observed breakfast tray in trash can; pt consumed 100%.  Reviewed wt hx; wt has been stable over the past several years.   Nutrition-Focused physical exam completed. Findings are no fat depletion, no muscle depletion, and no edema.     Medications reviewed and include lovenox.   Labs reviewed.   Current diet order is regular, patient is consuming approximately 100% of meals at this time. Labs and medications reviewed.   No nutrition interventions warranted at this time. If nutrition issues arise, please consult RD.   Levada Schilling, RD, LDN, CDCES Registered Dietitian II Certified Diabetes Care and Education Specialist Please refer to [facility] for RD and/or RD on-call/weekend/after hours pager

## 2020-11-22 NOTE — Progress Notes (Addendum)
Lab has attempted to draw blood three times on the patient, each time patient has refused.  MRI called to see if patient is willing today to have MRI today, as she refused yesterday 8/23, Patient refuses to have MRI right now.  Will notify MD of patient refusals, Dr. Allena Katz aware.

## 2020-11-22 NOTE — Progress Notes (Signed)
Unable to complete admission questions due to pt refusing to answer questions.

## 2020-11-22 NOTE — Progress Notes (Signed)
HD#1 SUBJECTIVE:  Patient Summary: Kathryn Medina is a 30 y.o. with a pertinent PMH of polysubstance use and IV drug use, MSSA infective endocarditis of tricuspid valve, DDD with lumbar radiculopathy, and hepatitis C who presented with chest pain and SOB and admitted for septic pulmonary emboli, MSSA bacteremia, and tricuspid valve endocarditis.   Overnight Events: HR sustaining in the 130s overnight. EKG performed and similar to prior, showing sinus tachycardia. Likely all secondary to refusing Lovenox in the setting of septic pulmonary emboli.   Interim History: This is hospital day 1 for Kathryn Medina who was seen and evaluated at the bedside this morning. She reports low back pain at this time, but declined getting an MRI performed. She denies any chest pain, palpitations, or shortness of breath. The patient appears tired/sluggish on exam, but she is awake and answers questions. Discussed echo findings with the patient and the vegetation that was seen on her tricuspid valve; the patient is aware of these findings and would be open to getting surgery if this an option to her.   OBJECTIVE:  Vital Signs: Vitals:   11/21/20 2011 11/21/20 2015 11/21/20 2257 11/21/20 2331  BP: 115/77  108/77 105/69  Pulse: (!) 126  (!) 135 (!) 133  Resp:    18  Temp:  100 F (37.8 C) 100 F (37.8 C) 99.6 F (37.6 C)  TempSrc:  Oral Oral Oral  SpO2: 95%  94% 93%  Weight:      Height:       Supplemental O2: Room Air SpO2: 93 %  Filed Weights   11/21/20 1314  Weight: 63.5 kg     Intake/Output Summary (Last 24 hours) at 11/22/2020 2979 Last data filed at 11/21/2020 2204 Gross per 24 hour  Intake 920 ml  Output --  Net 920 ml   Net IO Since Admission: 920 mL [11/22/20 0733]  Physical Exam: General: Chronically ill appearing young female laying in bed. No acute distress. CV: Tachycardic rate, regular rhythm. No murmurs, rubs, or gallops. No LE edema Pulmonary: Lungs CTAB. Normal effort. No  wheezing or rales. Abdominal: Soft, nontender, nondistended. Normal bowel sounds. Extremities: Palpable radial and DP pulses. Normal ROM. Skin: Warm and dry. Oslers nodes on bilateral palms.  Neuro: A&Ox3. Moves all extremities. Normal sensation. No focal deficit. Psych: Normal mood and affect    ASSESSMENT/PLAN:  Assessment: Active Problems:   Infective endocarditis   Plan: #Septic pulmonary emboli #Tricuspid Valve Infective Endocarditis with MSSA bacteremia #Hx of MSSA and Rosemonas bacteremia Patient has a history of MSSA bacteremia and tricuspid valve infective endocarditis that has not been completely treated due to patient persistently leaving AMA. Echo on 8/23 similar to echo on 8/8, showing 2cm tricuspid valve vegetation, with LVEF of 50%. Patient also has been endorsing ongoing low back pain, but the patient declined her MRI Lumbar Spine and continues to decline this, when offered. Patient is also refusing Lovenox injections, and thus, we will switch her therapy to oral Xarelto for her septic pulmonary emboli. - Continue IV Ancef 2g q8h for MSSA bacteremia - Xarelto 15 mg BID x21 days starting on 8/24, followed by 20 mg daily thereafter - CT surgery consulted for angiovac; the patient is a candidate but will require TEE first - TEE scheduled for 8/26 (will need to be NPO at midnight prior to this) - Trending CBC - Cardiac monitoring - Blood cultures with gram positive cocci in clusters at 1 day   #COVID-19 Patient diagnosed with COVID on  8/21. She denies any shortness of breath, cough, or any other respiratory symptoms at this time, but she does feel fatigued.  - Remdesivir therapy started on 8/24 - Continue to monitor symptoms   #Hypokalemia Potassium 2.9 in ED without evidence of EKG changes. Patient did get some K+ repletion, but refused last 2 doses. Patient also refusing morning labs, thus, unable to recheck.  - Potassium repletion  - Monitor BMP   #Hx of iron  deficiency anemia Hb stable at 7.9 on admission, unable to check morning CBC as patient is refusing labs. - Trend CBC - Can consider iron transfusion once bacteremia resolves  - Transfuse if Hb <7   #Opioid use disorder #Polysubstance use  Patient has a history of IV drug use with fentanyl, cocaine, heroine, methamphetamines, with last reported use 2 days prior to admission. The patient was initially started on a PCA pump on admission, however, she became very tired and frequently was falling asleep upon interview. PCA pump was discontinued and she was started on oral pain regimen. - Oxycodone 5 mg q4 hours PRN for severe pain - Monitor for signs of acute withdrawal or intoxication - Nicotine patch   Best Practice: Diet: Regular diet IVF: Fluids: none VTE: Therapeutic xarelto  Code: Full AB: vanc/cefepime DISPO: Anticipated discharge  to Home pending Medical stability.  Signature: Elza Rafter, D.O.  Internal Medicine Resident, PGY-1 [facility]  Pager: [phone] 7:33 AM, 11/22/2020   Please contact the on call pager after 5 pm and on weekends at [phone].

## 2020-11-23 ENCOUNTER — Other Ambulatory Visit (HOSPITAL_COMMUNITY): Payer: Self-pay

## 2020-11-23 DIAGNOSIS — A4101 Sepsis due to Methicillin susceptible Staphylococcus aureus: Principal | ICD-10-CM

## 2020-11-23 DIAGNOSIS — R7881 Bacteremia: Secondary | ICD-10-CM

## 2020-11-23 DIAGNOSIS — U071 COVID-19: Secondary | ICD-10-CM

## 2020-11-23 DIAGNOSIS — I2690 Septic pulmonary embolism without acute cor pulmonale: Secondary | ICD-10-CM

## 2020-11-23 DIAGNOSIS — B9561 Methicillin susceptible Staphylococcus aureus infection as the cause of diseases classified elsewhere: Secondary | ICD-10-CM

## 2020-11-23 LAB — BASIC METABOLIC PANEL
Anion gap: 8 (ref 5–15)
BUN: <5 mg/dL — ABNORMAL LOW (ref 6–20)
CO2: 20 mmol/L — ABNORMAL LOW (ref 22–32)
Calcium: 7.8 mg/dL — ABNORMAL LOW (ref 8.9–10.3)
Chloride: 105 mmol/L (ref 98–111)
Creatinine, Ser: 0.51 mg/dL (ref 0.44–1.00)
GFR, Estimated: >60 mL/min (ref >60)
Glucose, Bld: 103 mg/dL — ABNORMAL HIGH (ref 70–99)
Potassium: 4.1 mmol/L (ref 3.5–5.1)
Sodium: 133 mmol/L — ABNORMAL LOW (ref 135–145)

## 2020-11-23 LAB — CULTURE, BLOOD (ROUTINE X 2): Special Requests: ADEQUATE

## 2020-11-23 LAB — CBC
HCT: 25.8 % — ABNORMAL LOW (ref 36.0–46.0)
Hemoglobin: 8.4 g/dL — ABNORMAL LOW (ref 12.0–15.0)
MCH: 26 pg (ref 26.0–34.0)
MCHC: 32.6 g/dL (ref 30.0–36.0)
MCV: 79.9 fL — ABNORMAL LOW (ref 80.0–100.0)
Platelets: 390 10*3/uL (ref 150–400)
RBC: 3.23 MIL/uL — ABNORMAL LOW (ref 3.87–5.11)
RDW: 16.9 % — ABNORMAL HIGH (ref 11.5–15.5)
WBC: 11.2 10*3/uL — ABNORMAL HIGH (ref 4.0–10.5)
nRBC: 0 % (ref 0.0–0.2)

## 2020-11-23 LAB — MAGNESIUM: Magnesium: 1.5 mg/dL — ABNORMAL LOW (ref 1.7–2.4)

## 2020-11-23 MED ORDER — MAGNESIUM SULFATE 2 GM/50ML IV SOLN
2.0000 g | Freq: Once | INTRAVENOUS | Status: AC
  Administered 2020-11-23: 2 g via INTRAVENOUS
  Filled 2020-11-23: qty 50

## 2020-11-23 MED ORDER — SODIUM CHLORIDE 0.9 % IV SOLN: INTRAVENOUS | Status: DC

## 2020-11-23 MED ORDER — POTASSIUM CHLORIDE CRYS ER 20 MEQ PO TBCR
40.0000 meq | EXTENDED_RELEASE_TABLET | Freq: Two times a day (BID) | ORAL | Status: DC
  Administered 2020-11-23: 40 meq via ORAL
  Filled 2020-11-23: qty 2

## 2020-11-23 MED ORDER — CYCLOBENZAPRINE HCL 10 MG PO TABS
5.0000 mg | ORAL_TABLET | Freq: Three times a day (TID) | ORAL | Status: DC
  Administered 2020-11-23 – 2020-11-25 (×6): 5 mg via ORAL
  Filled 2020-11-23 (×7): qty 1

## 2020-11-23 NOTE — Progress Notes (Signed)
[facility]  Date of Admission:  11/21/2020           Reason for visit: Follow up on MSSA bacteremia  Current antibiotics: Cefazolin 8/23--present   Previous antibiotics: Vancomycin 8/20--8/23 Cefepime 8/20--8/23  ASSESSMENT:    30 y.o. female admitted with:  MSSA bacteremia with tricuspid valve endocarditis and pulmonary septic emboli: Pending TEE to further evaluate for mitral valve involvement.  Repeat blood cultures positive as of 11/21/2020.  Repeat blood cultures ordered as of this morning. Low back pain: Concerning for discitis/osteomyelitis in the setting of MSSA bacteremia.  Lumbar spine MRI has been ordered for further evaluation, however, patient is currently declining to have this done. COVID-19: Tested positive this admission and currently on room air.  Receiving remdesivir. Opiate use disorder  RECOMMENDATIONS:    Continue cefazolin TEE pending for tomorrow Follow-up repeat blood cultures Hopefully she will agree to MRI of the lumbar spine to further evaluate her back pain COVID treatment and isolation per primary team and IP Will follow   Principal Problem:   Infective endocarditis Active Problems:   COVID-19   Polysubstance abuse (HCC)   Sepsis (HCC)   MSSA bacteremia   Septic pulmonary embolism (HCC)    MEDICATIONS:    Scheduled Meds:  sodium chloride   Intravenous Once   nicotine  14 mg Transdermal Daily   potassium chloride  40 mEq Oral BID   Rivaroxaban  15 mg Oral BID WC   Followed by   Melene Muller ON 12/13/2020] rivaroxaban  20 mg Oral Q supper   sodium chloride flush  3 mL Intravenous Q12H   Continuous Infusions:   ceFAZolin (ANCEF) IV Stopped (11/23/20 0645)   remdesivir 100 mg in NS 100 mL 100 mg (11/23/20 0845)   PRN Meds:.acetaminophen **OR** acetaminophen, oxyCODONE, senna-docusate  SUBJECTIVE:   24 hour events:  No acute events noted overnight Afebrile, T-max 98.3 No new labs this morning Blood  cultures positive from 8/23 No new imaging   Patient reports no new issues today.  No fevers.  She continues to endorse low back pain however declines MRI.  No shortness of breath currently.  Discussed plans for TEE tomorrow and need for MRI.  Review of Systems  All other systems reviewed and are negative.    OBJECTIVE:   Blood pressure 94/64, pulse 67, temperature 97.8 F (36.6 C), temperature source Oral, resp. rate 16, height 5\' 8"  (1.727 m), weight 61.4 kg, last menstrual period 11/16/2020, SpO2 99 %. Body mass index is 20.59 kg/m.  Physical Exam Constitutional:      General: She is not in acute distress.    Appearance: Normal appearance.  HENT:     Head: Normocephalic and atraumatic.  Cardiovascular:     Rate and Rhythm: Normal rate and regular rhythm.     Heart sounds: Murmur heard.  Pulmonary:     Effort: Pulmonary effort is normal. No respiratory distress.  Abdominal:     General: There is no distension.     Palpations: Abdomen is soft.     Tenderness: There is no abdominal tenderness.  Musculoskeletal:        General: Normal range of motion.     Cervical back: Normal range of motion and neck supple.  Skin:    General: Skin is warm and dry.  Neurological:     General: No focal deficit present.     Mental Status: She is alert and oriented to person, place, and time.  Motor: No weakness.  Psychiatric:        Mood and Affect: Mood normal.        Behavior: Behavior normal.     Lab Results: Lab Results  Component Value Date   WBC 17.6 (H) 11/21/2020   HGB 7.9 (L) 11/21/2020   HCT 24.5 (L) 11/21/2020   MCV 80.6 11/21/2020   PLT 524 (H) 11/21/2020    Lab Results  Component Value Date   NA 131 (L) 11/21/2020   K 2.7 (LL) 11/21/2020   CO2 25 11/21/2020   GLUCOSE 127 (H) 11/21/2020   BUN <5 (L) 11/21/2020   CREATININE 0.63 11/21/2020   CALCIUM 8.0 (L) 11/21/2020   GFRNONAA >60 11/21/2020   GFRAA >60 11/18/2018    Lab Results  Component Value Date    ALT 10 11/21/2020   AST 19 11/21/2020   ALKPHOS 80 11/21/2020   BILITOT 0.5 11/21/2020    No results found for: CRP  No results found for: ESRSEDRATE   I have reviewed the micro and lab results in Epic.  Imaging: CT Angio Chest Pulmonary Embolism (PE) W or WO Contrast  Result Date: 11/21/2020 CLINICAL DATA:  Concern for pulmonary embolism. EXAM: CT ANGIOGRAPHY CHEST WITH CONTRAST TECHNIQUE: Multidetector CT imaging of the chest was performed using the standard protocol during bolus administration of intravenous contrast. Multiplanar CT image reconstructions and MIPs were obtained to evaluate the vascular anatomy. CONTRAST:  56 mL OMNIPAQUE IOHEXOL 350 MG/ML SOLN COMPARISON:  None. FINDINGS: Vascular Findings: There is adequate opacification of the pulmonary arterial system with the main pulmonary artery measuring 274 Hounsfield units. There is a moderate-sized occlusive filling defect involving near the entirety of the right lower lobe pulmonary artery (axial image 67, series 6; coronal image 65, series 9) with preserved opacification of the right anterior and medial basilar segmental pulmonary arteries but with non opacification of the right posterior and lateral segmental arteries. There is an addition small occlusive filling defect involving the posterior basilar segmental artery of the left lower lobe (axial image 83, series 6; coronal image 84, series 9). Normal caliber of the main pulmonary artery. There is no definitive intraventricular septal bowing to suggest right-sided heart failure. Cardiomegaly.  No pericardial effusion. No evidence of thoracic aortic aneurysm or dissection on this nongated examination. Conventional configuration of the aortic arch. Review of the MIP images confirms the above findings. ---------------------------------------------------------------------------------- Nonvascular Findings: Mediastinum/Lymph Nodes: Ill-defined soft tissue within the anterior mediastinum  without associated mass effect and favored to represent residual thymic tissue. No definitive bulky mediastinal or hilar adenopathy though evaluation degraded secondary to lack mediastinal fat. No axillary lymphadenopathy. Lungs/Pleura: Bilateral nodular consolidative airspace opacities, worse within the right lower lobe with several of the nodular consolidative opacities demonstrating central cavitation worrisome for multifocal pneumonia, suggestive of septic pulmonary emboli. Trace bilateral pleural effusions. Minimal biapical paraseptal emphysematous change. Scattered air bronchograms are seen within the perihilar consolidation of the right lower lobe however the central pulmonary airways remain patent. No pneumothorax. Upper abdomen: Limited early arterial phase evaluation of the upper abdomen is unremarkable. Musculoskeletal: No acute or aggressive osseous abnormalities. Regional soft tissues appear normal. Normal appearance of the thyroid gland. IMPRESSION: 1. Bilateral nodular consolidative opacities, many of which demonstrate central cavitation worrisome for multifocal infection, specifically, septic pulmonary emboli. As this finding is associated with cardiomegaly, further evaluation with cardiac echo and/or TEE could be performed as indicated. 2. The examination is positive for pulmonary embolism involving the right lower lobe pulmonary artery  as well as the left posterior basilar segmental pulmonary artery. Overall clot burden is deemed small in volume and there is no CT evidence of right-sided heart strain. 3.  Emphysema (ICD10-J43.9). Electronically Signed   By: Simonne Come M.D.   On: 11/21/2020 10:16   ECHOCARDIOGRAM COMPLETE  Result Date: 11/21/2020    ECHOCARDIOGRAM REPORT   Patient Name:   [facility] [place] Date of Exam: 11/21/2020 Medical Rec #:  [id number]        Height:       68.0 in Accession #:    [id number]       Weight:       121.9 lb Date of Birth:  January 22, 1991         BSA:          1.656 m  Patient Age:    30 years         BP:           108/80 mmHg Patient Gender: F                HR:           125 bpm. Exam Location:  Inpatient Procedure: 2D Echo, Color Doppler and Cardiac Doppler Indications:    Endocarditis I38  History:        Patient has prior history of Echocardiogram examinations, most                 recent 11/06/2020. Signs/Symptoms:Shortness of Breath. History of                 substance abuse, COVID 19.  Sonographer:    Leta Jungling RDCS Referring Phys: [phone] ELIZABETH REES  Sonographer Comments: Technically difficult study due to poor echo windows. IMPRESSIONS  1. 2 cm tricuspid valve vegetation. The tricuspid valve is abnormal. Tricuspid valve regurgitation is severe.  2. Left ventricular ejection fraction, by estimation, is 50%. The left ventricle has low normal function. The left ventricle has no regional wall motion abnormalities. There is mild left ventricular hypertrophy. Left ventricular diastolic parameters are  indeterminate.  3. Right ventricular systolic function is mildly reduced. The right ventricular size is moderately enlarged. There is normal pulmonary artery systolic pressure. The estimated right ventricular systolic pressure is 16.0 mmHg.  4. Left atrial size was mildly dilated.  5. Right atrial size was mild to moderately dilated.  6. A small pericardial effusion is present. There is no evidence of cardiac tamponade.  7. The mitral valve is grossly normal. Mild to moderate mitral valve regurgitation. No evidence of mitral stenosis.  8. The aortic valve is tricuspid. Aortic valve regurgitation is not visualized.  9. The inferior vena cava is dilated in size with >50% respiratory variability, suggesting right atrial pressure of 8 mmHg. FINDINGS  Left Ventricle: Left ventricular ejection fraction, by estimation, is 50%. The left ventricle has low normal function. The left ventricle has no regional wall motion abnormalities. The left ventricular internal cavity size was  normal in size. There is mild left ventricular hypertrophy. Left ventricular diastolic parameters are indeterminate. Right Ventricle: The right ventricular size is moderately enlarged. No increase in right ventricular wall thickness. Right ventricular systolic function is mildly reduced. There is normal pulmonary artery systolic pressure. The tricuspid regurgitant velocity is 1.41 m/s, and with an assumed right atrial pressure of 8 mmHg, the estimated right ventricular systolic pressure is 16.0 mmHg. Left Atrium: Left atrial size was mildly dilated. Right Atrium: Right atrial size was mild to moderately  dilated. Pericardium: A small pericardial effusion is present. There is no evidence of cardiac tamponade. Mitral Valve: The mitral valve is grossly normal. Mild to moderate mitral valve regurgitation. No evidence of mitral valve stenosis. Tricuspid Valve: 2 cm tricuspid valve vegetation. The tricuspid valve is abnormal. Tricuspid valve regurgitation is severe. Aortic Valve: The aortic valve is tricuspid. Aortic valve regurgitation is not visualized. Pulmonic Valve: The pulmonic valve was normal in structure. Pulmonic valve regurgitation is trivial. Aorta: The aortic root is normal in size and structure. Venous: The inferior vena cava is dilated in size with greater than 50% respiratory variability, suggesting right atrial pressure of 8 mmHg. IAS/Shunts: No atrial level shunt detected by color flow Doppler.  LEFT VENTRICLE PLAX 2D LVIDd:         4.80 cm LVIDs:         2.90 cm LV PW:         1.00 cm LV IVS:        1.10 cm LVOT diam:     2.00 cm LV SV:         48 LV SV Index:   29 LVOT Area:     3.14 cm  RIGHT VENTRICLE TAPSE (M-mode): 2.2 cm LEFT ATRIUM           Index LA diam:      2.80 cm 1.69 cm/m LA Vol (A4C): 25.9 ml 15.64 ml/m  AORTIC VALVE LVOT Vmax:   97.50 cm/s LVOT Vmean:  75.400 cm/s LVOT VTI:    0.153 m  AORTA Ao Root diam: 3.20 cm Ao Asc diam:  3.10 cm TRICUSPID VALVE TR Peak grad:   8.0 mmHg TR Vmax:         141.00 cm/s  SHUNTS Systemic VTI:  0.15 m Systemic Diam: 2.00 cm Weston BrassGayatri Acharya MD Electronically signed by Weston BrassGayatri Acharya MD Signature Date/Time: 11/21/2020/3:26:38 PM    Final      Imaging independently reviewed in Epic.    Vedia CofferAndrew N Rogerio Boutelle [facility] [phone] pager 11/23/2020, 9:14 AM

## 2020-11-23 NOTE — Anesthesia Preprocedure Evaluation (Addendum)
Anesthesia Evaluation  Patient identified by MRN, date of birth, ID band Patient awake    Reviewed: Allergy & Precautions, NPO status , Patient's Chart, lab work & pertinent test results  Airway Mallampati: II  TM Distance: >3 FB Neck ROM: Full    Dental no notable dental hx. (+) Teeth Intact, Dental Advisory Given   Pulmonary Current Smoker and Patient abstained from smoking.,  covid +   Pulmonary exam normal breath sounds clear to auscultation       Cardiovascular Normal cardiovascular exam Rhythm:Regular Rate:Normal     Neuro/Psych    GI/Hepatic (+)     substance abuse  IV drug use, Hepatitis -, C  Endo/Other    Renal/GU      Musculoskeletal   Abdominal   Peds  Hematology   Anesthesia Other Findings MSSA toungue ring  Reproductive/Obstetrics                            Anesthesia Physical Anesthesia Plan  ASA: 3  Anesthesia Plan: MAC   Post-op Pain Management:    Induction: Intravenous  PONV Risk Score and Plan: Treatment may vary due to age or medical condition  Airway Management Planned: Natural Airway and Nasal Cannula  Additional Equipment: None  Intra-op Plan:   Post-operative Plan:   Informed Consent: I have reviewed the patients History and Physical, chart, labs and discussed the procedure including the risks, benefits and alternatives for the proposed anesthesia with the patient or authorized representative who has indicated his/her understanding and acceptance.     Dental advisory given  Plan Discussed with: CRNA  Anesthesia Plan Comments:        Anesthesia Quick Evaluation

## 2020-11-23 NOTE — Progress Notes (Signed)
HD#2  SUBJECTIVE:  Patient Summary: Kathryn Medina is a 30 y.o. with a pertinent PMH of polysubstance use and IV drug use, MSSA infective endocarditis of tricuspid valve, DDD with lumbar radiculopathy, and hepatitis C who presented with chest pain and SOB and admitted for septic pulmonary emboli, MSSA bacteremia, and tricuspid valve endocarditis.   Overnight Events: HR improved from 130s to 60s-70s. No acute events or concerns overnight.   Interim History: This is hospital day 2 for Kathryn Medina who was seen and evaluated at the bedside this morning. She reports feeling better overall. Improvement in L foot pain. She continues to have SOB same as prior without chest pain. Denies cough, subjective fever. She endorses sweating and loose stools, feels she may be withdrawing. We discussed need for TEE procedure tomorrow to see if she is a candidate for angiovac. Discussed importance of obtaining MRI lumbar spine, patient still refusing without clear reason for refusal besides that she does not want to. Denies back pain today. Discussed low potassium and need to replete to prevent dangerous arrhythmias.    OBJECTIVE:  Vital Signs: Vitals:   11/23/20 0011 11/23/20 0553 11/23/20 0832 11/23/20 1100  BP: (!) 114/55 97/60 94/64  115/76  Pulse: 79 68 67 (!) 121  Resp: 16 17 16 16   Temp: 98.2 F (36.8 C) 98.2 F (36.8 C) 97.8 F (36.6 C) 97.7 F (36.5 C)  TempSrc: Oral Oral Oral Oral  SpO2: 98% 99%  100%  Weight:  61.4 kg    Height:       Supplemental O2: Room Air SpO2: 100 %  Filed Weights   11/21/20 1314 11/23/20 0553  Weight: 63.5 kg 61.4 kg     Intake/Output Summary (Last 24 hours) at 11/23/2020 1305 Last data filed at 11/23/2020 0800 Gross per 24 hour  Intake 400 ml  Output --  Net 400 ml   Net IO Since Admission: 1,764 mL [11/23/20 1305]  Physical Exam: General: Chronically ill appearing young female laying in bed. No acute distress. Restless.  CV: Tachycardic, regular  rhythm. No murmurs, rubs, or gallops. No LE edema Pulmonary: Lungs CTAB. Normal effort. No wheezing or rales. Abdominal: Soft, nontender, nondistended. Normal bowel sounds. Extremities: Palpable radial and DP pulses. Normal ROM. Skin: Warm and dry. Oslers nodes on bilateral palms.  Neuro: A&Ox3. Moves all extremities. Normal sensation. No focal deficit. Pupils 23mm dilated bilaterally. Psych: Normal mood, irritated/anxious affect   CBC Latest Ref Rng & Units 11/23/2020 11/21/2020 11/19/2020  WBC 4.0 - 10.5 K/uL 11.2(H) 17.6(H) 16.3(H)  Hemoglobin 12.0 - 15.0 g/dL 11/21/2020) 7.9(L) 6.8(LL)  Hematocrit 36.0 - 46.0 % 25.8(L) 24.5(L) 21.1(L)  Platelets 150 - 400 K/uL 390 524(H) 357   BMP Latest Ref Rng & Units 11/23/2020 11/21/2020 11/19/2020  Glucose 70 - 99 mg/dL 11/21/2020) 732(K) 025(K)  BUN 6 - 20 mg/dL 270(W) <2(B) <7(S)  Creatinine 0.44 - 1.00 mg/dL <2(G 3.15 1.76  Sodium 135 - 145 mmol/L 133(L) 131(L) 129(L)  Potassium 3.5 - 5.1 mmol/L 4.1 2.7(LL) 2.8(L)  Chloride 98 - 111 mmol/L 105 95(L) 96(L)  CO2 22 - 32 mmol/L 20(L) 25 26  Calcium 8.9 - 10.3 mg/dL 7.8(L) 8.0(L) 7.5(L)     ASSESSMENT/PLAN:  Assessment: Principal Problem:   Infective endocarditis Active Problems:   COVID-19   Polysubstance abuse (HCC)   Sepsis (HCC)   MSSA bacteremia   Septic pulmonary embolism (HCC)   Plan: #Septic pulmonary emboli #Tricuspid Valve Infective Endocarditis with MSSA bacteremia #Hx of MSSA and Rosemonas  bacteremia Patient has a history of MSSA bacteremia and tricuspid valve infective endocarditis that has not been completely treated due to patient persistently leaving AMA. Echo on 8/23 similar to echo on 8/8, showing 2cm tricuspid valve vegetation, with LVEF of 50%. Patient also has been endorsing ongoing low back pain, but the patient declined her MRI Lumbar Spine and continues to decline this, when offered. Patient is also refusing Lovenox injections, and thus, we will switch her therapy to oral  Xarelto for her septic pulmonary emboli. - Continue IV Ancef 2g q8h for MSSA bacteremia - Xarelto 15 mg BID x21 days starting on 8/24, followed by 20 mg daily starting on 9/14 - CT surgery consulted for angiovac; the patient is a candidate but will require TEE first - TEE scheduled for 8/26 (will need to be NPO at midnight prior to this) - MRI Lumbar Spine if patient agrees - Trending CBC - Cardiac monitoring - Blood cultures with gram positive cocci in clusters, grew staph aureus with susceptibilities pending   #COVID-19 Patient diagnosed with COVID on 8/06 (care everywhere). Likely persistent positive test after recent COVID19 infection. She denies any shortness of breath, cough, or any other respiratory symptoms at this time. No further management at this time.  #Hypokalemia #Hypomagnesemia Likely 2/2 sweating and poor PO intake. Potassium 2.9 in ED without evidence of EKG changes. Patient did get some K+ repletion, but refusing doses. Patient also intermittently refusing morning labs. Magnesium low at 1.5 on 8/25. - Potassium repletion  - Magnesium repletion - Monitor BMP   #Hx of iron deficiency anemia Hb stable at 7.9 on admission. Stable Hgb 7-9. - Trend CBC - Can consider iron transfusion once bacteremia resolves   - Transfuse if Hb <7   #Opioid use disorder #Polysubstance use  Patient has a history of IV drug use with fentanyl, cocaine, heroine, methamphetamines, with last reported use 2 days prior to admission. The patient was initially started on a PCA pump on admission, however, she became very tired and frequently was falling asleep upon interview. PCA pump was discontinued and she was started on oral pain regimen. Patient reports sweating, loose stools, anxious on 8/25. COWS 8 mild withdrawal on exam.  - Discontinue Oxycodone with plan to start Buprenorphine tomorrow, if COWS tomorrow morning is greater than 14 plan to start Buprenorphine in AM.   - Start cyclobenzaprine  5mg TID for body aches/withdrawal sx in interm prior to starting buprenorphine, plan to increase to 10mg TID if needed  - Continue COWS to monitor for signs of acute withdrawal or intoxication - Nicotine patch   Best Practice: Diet: Regular diet IVF: none VTE: Therapeutic xarelto  Code: Full AB: Cefazolin DISPO: Anticipated discharge  to Home pending Medical stability.  Signature: Taylr Meuth, MD 11/23/20, 2:05 PM Pager: [phone] Internal Medicine Resident, PGY-1 [employer] Internal Medicine    Please contact the on call pager after 5 pm and on weekends at [phone].  

## 2020-11-23 NOTE — H&P (View-Only) (Signed)
HD#2  SUBJECTIVE:  Patient Summary: Kathryn Medina is a 30 y.o. with a pertinent PMH of polysubstance use and IV drug use, MSSA infective endocarditis of tricuspid valve, DDD with lumbar radiculopathy, and hepatitis C who presented with chest pain and SOB and admitted for septic pulmonary emboli, MSSA bacteremia, and tricuspid valve endocarditis.   Overnight Events: HR improved from 130s to 60s-70s. No acute events or concerns overnight.   Interim History: This is hospital day 2 for Kathryn Medina who was seen and evaluated at the bedside this morning. She reports feeling better overall. Improvement in L foot pain. She continues to have SOB same as prior without chest pain. Denies cough, subjective fever. She endorses sweating and loose stools, feels she may be withdrawing. We discussed need for TEE procedure tomorrow to see if she is a candidate for angiovac. Discussed importance of obtaining MRI lumbar spine, patient still refusing without clear reason for refusal besides that she does not want to. Denies back pain today. Discussed low potassium and need to replete to prevent dangerous arrhythmias.    OBJECTIVE:  Vital Signs: Vitals:   11/23/20 0011 11/23/20 0553 11/23/20 0832 11/23/20 1100  BP: (!) 114/55 97/60 94/64  115/76  Pulse: 79 68 67 (!) 121  Resp: 16 17 16 16   Temp: 98.2 F (36.8 C) 98.2 F (36.8 C) 97.8 F (36.6 C) 97.7 F (36.5 C)  TempSrc: Oral Oral Oral Oral  SpO2: 98% 99%  100%  Weight:  61.4 kg    Height:       Supplemental O2: Room Air SpO2: 100 %  Filed Weights   11/21/20 1314 11/23/20 0553  Weight: 63.5 kg 61.4 kg     Intake/Output Summary (Last 24 hours) at 11/23/2020 1305 Last data filed at 11/23/2020 0800 Gross per 24 hour  Intake 400 ml  Output --  Net 400 ml   Net IO Since Admission: 1,764 mL [11/23/20 1305]  Physical Exam: General: Chronically ill appearing young female laying in bed. No acute distress. Restless.  CV: Tachycardic, regular  rhythm. No murmurs, rubs, or gallops. No LE edema Pulmonary: Lungs CTAB. Normal effort. No wheezing or rales. Abdominal: Soft, nontender, nondistended. Normal bowel sounds. Extremities: Palpable radial and DP pulses. Normal ROM. Skin: Warm and dry. Oslers nodes on bilateral palms.  Neuro: A&Ox3. Moves all extremities. Normal sensation. No focal deficit. Pupils 23mm dilated bilaterally. Psych: Normal mood, irritated/anxious affect   CBC Latest Ref Rng & Units 11/23/2020 11/21/2020 11/19/2020  WBC 4.0 - 10.5 K/uL 11.2(H) 17.6(H) 16.3(H)  Hemoglobin 12.0 - 15.0 g/dL 11/21/2020) 7.9(L) 6.8(LL)  Hematocrit 36.0 - 46.0 % 25.8(L) 24.5(L) 21.1(L)  Platelets 150 - 400 K/uL 390 524(H) 357   BMP Latest Ref Rng & Units 11/23/2020 11/21/2020 11/19/2020  Glucose 70 - 99 mg/dL 11/21/2020) 732(K) 025(K)  BUN 6 - 20 mg/dL 270(W) <2(B) <7(S)  Creatinine 0.44 - 1.00 mg/dL <2(G 3.15 1.76  Sodium 135 - 145 mmol/L 133(L) 131(L) 129(L)  Potassium 3.5 - 5.1 mmol/L 4.1 2.7(LL) 2.8(L)  Chloride 98 - 111 mmol/L 105 95(L) 96(L)  CO2 22 - 32 mmol/L 20(L) 25 26  Calcium 8.9 - 10.3 mg/dL 7.8(L) 8.0(L) 7.5(L)     ASSESSMENT/PLAN:  Assessment: Principal Problem:   Infective endocarditis Active Problems:   COVID-19   Polysubstance abuse (HCC)   Sepsis (HCC)   MSSA bacteremia   Septic pulmonary embolism (HCC)   Plan: #Septic pulmonary emboli #Tricuspid Valve Infective Endocarditis with MSSA bacteremia #Hx of MSSA and Rosemonas  bacteremia Patient has a history of MSSA bacteremia and tricuspid valve infective endocarditis that has not been completely treated due to patient persistently leaving AMA. Echo on 8/23 similar to echo on 8/8, showing 2cm tricuspid valve vegetation, with LVEF of 50%. Patient also has been endorsing ongoing low back pain, but the patient declined her MRI Lumbar Spine and continues to decline this, when offered. Patient is also refusing Lovenox injections, and thus, we will switch her therapy to oral  Xarelto for her septic pulmonary emboli. - Continue IV Ancef 2g q8h for MSSA bacteremia - Xarelto 15 mg BID x21 days starting on 8/24, followed by 20 mg daily starting on 9/14 - CT surgery consulted for angiovac; the patient is a candidate but will require TEE first - TEE scheduled for 8/26 (will need to be NPO at midnight prior to this) - MRI Lumbar Spine if patient agrees - Trending CBC - Cardiac monitoring - Blood cultures with gram positive cocci in clusters, grew staph aureus with susceptibilities pending   #COVID-19 Patient diagnosed with COVID on 8/06 (care everywhere). Likely persistent positive test after recent COVID19 infection. She denies any shortness of breath, cough, or any other respiratory symptoms at this time. No further management at this time.  #Hypokalemia #Hypomagnesemia Likely 2/2 sweating and poor PO intake. Potassium 2.9 in ED without evidence of EKG changes. Patient did get some K+ repletion, but refusing doses. Patient also intermittently refusing morning labs. Magnesium low at 1.5 on 8/25. - Potassium repletion  - Magnesium repletion - Monitor BMP   #Hx of iron deficiency anemia Hb stable at 7.9 on admission. Stable Hgb 7-9. - Trend CBC - Can consider iron transfusion once bacteremia resolves   - Transfuse if Hb <7   #Opioid use disorder #Polysubstance use  Patient has a history of IV drug use with fentanyl, cocaine, heroine, methamphetamines, with last reported use 2 days prior to admission. The patient was initially started on a PCA pump on admission, however, she became very tired and frequently was falling asleep upon interview. PCA pump was discontinued and she was started on oral pain regimen. Patient reports sweating, loose stools, anxious on 8/25. COWS 8 mild withdrawal on exam.  - Discontinue Oxycodone with plan to start Buprenorphine tomorrow, if COWS tomorrow morning is greater than 14 plan to start Buprenorphine in AM.   - Start cyclobenzaprine  5mg  TID for body aches/withdrawal sx in interm prior to starting buprenorphine, plan to increase to 10mg  TID if needed  - Continue COWS to monitor for signs of acute withdrawal or intoxication - Nicotine patch   Best Practice: Diet: Regular diet IVF: none VTE: Therapeutic xarelto  Code: Full AB: Cefazolin DISPO: Anticipated discharge  to Home pending Medical stability.  Signature: , MD 11/23/20, 2:05 PM Pager: [phone] Internal Medicine Resident, PGY-1 11/25/20 Internal Medicine    Please contact the on call pager after 5 pm and on weekends at [phone].

## 2020-11-23 NOTE — Progress Notes (Signed)
    [facility] has been requested to perform a transesophageal echocardiogram on Kathryn Medina for bacteremia with hx of infective endocarditis of tricuspid valve .  Now positive for COVID and septic pulmonary emboli, MSSA bacteremia and echo with Tricuspid vegetation.   After careful review of history and examination, the risks and benefits of transesophageal echocardiogram have been explained including risks of esophageal damage, perforation (1:10,000 risk), bleeding, pharyngeal hematoma as well as other potential complications associated with conscious sedation including aspiration, arrhythmia, respiratory failure and death. Alternatives to treatment were discussed, questions were answered. Patient is willing to proceed.   Nada Boozer, NP  11/23/2020 3:06 PM

## 2020-11-24 ENCOUNTER — Encounter (HOSPITAL_COMMUNITY)
Admission: EM | Discharge status: Against Medical Advice | Payer: Self-pay | Source: Home / Self Care | Attending: Internal Medicine

## 2020-11-24 ENCOUNTER — Inpatient Hospital Stay (HOSPITAL_COMMUNITY): Payer: Self-pay | Admitting: Anesthesiology

## 2020-11-24 ENCOUNTER — Inpatient Hospital Stay (HOSPITAL_COMMUNITY): Payer: Self-pay

## 2020-11-24 ENCOUNTER — Encounter (HOSPITAL_COMMUNITY): Payer: Self-pay | Admitting: Internal Medicine

## 2020-11-24 DIAGNOSIS — I340 Nonrheumatic mitral (valve) insufficiency: Secondary | ICD-10-CM

## 2020-11-24 DIAGNOSIS — I361 Nonrheumatic tricuspid (valve) insufficiency: Secondary | ICD-10-CM

## 2020-11-24 DIAGNOSIS — I339 Acute and subacute endocarditis, unspecified: Secondary | ICD-10-CM

## 2020-11-24 HISTORY — PX: TEE WITHOUT CARDIOVERSION: SHX5443

## 2020-11-24 LAB — CULTURE, BLOOD (ROUTINE X 2)

## 2020-11-24 SURGERY — ECHOCARDIOGRAM, TRANSESOPHAGEAL
Anesthesia: Monitor Anesthesia Care

## 2020-11-24 MED ORDER — PROPOFOL 500 MG/50ML IV EMUL
INTRAVENOUS | Status: DC | PRN
  Administered 2020-11-24: 175 ug/kg/min via INTRAVENOUS

## 2020-11-24 MED ORDER — DEXMEDETOMIDINE (PRECEDEX) IN NS 20 MCG/5ML (4 MCG/ML) IV SYRINGE
PREFILLED_SYRINGE | INTRAVENOUS | Status: DC | PRN
  Administered 2020-11-24: 12 ug via INTRAVENOUS
  Administered 2020-11-24: 8 ug via INTRAVENOUS

## 2020-11-24 MED ORDER — PROPOFOL 10 MG/ML IV BOLUS
INTRAVENOUS | Status: DC | PRN
  Administered 2020-11-24 (×3): 30 mg via INTRAVENOUS

## 2020-11-24 MED ORDER — BUPRENORPHINE HCL 2 MG SL SUBL
2.0000 mg | SUBLINGUAL_TABLET | Freq: Every day | SUBLINGUAL | Status: DC
Start: 2020-11-24 — End: 2020-11-25
  Administered 2020-11-24: 2 mg via SUBLINGUAL
  Filled 2020-11-24: qty 1

## 2020-11-24 MED ORDER — RIVAROXABAN 15 MG PO TABS
15.0000 mg | ORAL_TABLET | Freq: Two times a day (BID) | ORAL | Status: DC
Start: 2020-11-24 — End: 2020-11-24
  Filled 2020-11-24: qty 1

## 2020-11-24 MED ORDER — RIVAROXABAN 20 MG PO TABS: 20.0000 mg | ORAL_TABLET | Freq: Every day | ORAL | Status: DC

## 2020-11-24 NOTE — Anesthesia Procedure Notes (Signed)
Procedure Name: MAC Date/Time: 11/24/2020 9:25 AM Performed by: Inda Coke, CRNA Pre-anesthesia Checklist: Patient identified, Emergency Drugs available, Suction available, Timeout performed and Patient being monitored Patient Re-evaluated:Patient Re-evaluated prior to induction Oxygen Delivery Method: Nasal cannula Induction Type: IV induction Dental Injury: Teeth and Oropharynx as per pre-operative assessment

## 2020-11-24 NOTE — Transfer of Care (Signed)
Immediate Anesthesia Transfer of Care Note  Patient: Dorotea Hand  Procedure(s) Performed: TRANSESOPHAGEAL ECHOCARDIOGRAM (TEE)  Patient Location: Endoscopy Unit  Anesthesia Type:MAC  Level of Consciousness: awake and drowsy  Airway & Oxygen Therapy: Patient Spontanous Breathing and Patient connected to nasal cannula oxygen  Post-op Assessment: Report given to RN and Post -op Vital signs reviewed and stable  Post vital signs: Reviewed and stable  Last Vitals:  Vitals Value Taken Time  BP    Temp    Pulse    Resp    SpO2      Last Pain:  Vitals:   11/24/20 0855  TempSrc: Temporal  PainSc: Asleep      Patients Stated Pain Goal: 0 (89/21/19 4174)  Complications: No notable events documented.

## 2020-11-24 NOTE — Hospital Course (Addendum)
Kathryn Medina is a 30 y.o. with a pertinent PMH of polysubstance use and IV drug use, MSSA infective endocarditis of tricuspid valve, DDD with lumbar radiculopathy, and hepatitis C who presented with chest pain and SOB and admitted for septic pulmonary emboli, MSSA bacteremia, and tricuspid valve endocarditis.   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 Echo on 8/23 similar to echo on 8/8, showing 2cm tricuspid valve vegetation, with LVEF of 50%. Patient also has been endorsing ongoing low back pain, but the patient declined her MRI Lumbar Spine and continues to decline this, when offered. Patient is also refusing Lovenox injections, and thus, we will switched her therapy to oral Xarelto for her septic pulmonary emboli. Patient received IV Ancef 2g q8h for MSSA bacteremia. Had TEE on 8/26 which showed large tricuspid valve vegetation and severe TR. No vegetation noted on the mitral valve, although there is mild to moderate mitral insufficiency. CT surgery consulted for angiovac and will perform this on 8/29. Patient did not want to stay and stated that she was going to have a ride pick her up on 8/27 and she is going to sign out AMA. Discussed the risks of doing this, including risk of stroke and death, and the patient expressed understanding. She does not wish to stay for any further treatment. Offered IV linezolid and oritavancin prior to discharge, but the patient refused. Will send patient on oral Keflex 500mg  4 times daily for 2 months per ID recommendations and therapeutic xarelto for septic PE. Also expressed that if the patient returns again for treatment, there is no guarantee she can still get her surgery on Monday as previously scheduled. Patient again  expressed understanding and is still wishing to leave AMA.    #COVID-19 Patient diagnosed with COVID on 8/6 according to care everywhere. She denies any shortness of breath, cough, or any other respiratory symptoms at this time, but she does feel fatigued. Remdesivir therapy started on 8/24 and was discontinued once original covid test result was found.    #Hypokalemia #Hypomagnesemia Likely 2/2 sweating and poor PO intake. Potassium 2.9 in ED without evidence of EKG changes. Patient did get some K+ repletion, but refused last 2 doses. Repeat potassium improved to 4.1. Mg found to be low at 1.5, although unable to recheck after patient was given Mg repletion due to patient refusing labs.   #Hx of iron deficiency anemia Hb stable at 7.9 on admission. Can consider iron transfusion once bacteremia resolves     #Opioid use disorder #Polysubstance use  Patient has a history of IV drug use with fentanyl, cocaine, heroine, methamphetamines, with last reported use 2 days prior to admission. The patient was initially started on a PCA pump on admission, however, she became very tired and frequently was falling asleep upon interview. PCA pump was discontinued and she was started on oral pain regimen. Oral oxycodone was discontinued on 8/25 and the patient was started on buprenorphine 2 mg on 8/26 and this was increased to 8mg  on 8/27.

## 2020-11-24 NOTE — Anesthesia Postprocedure Evaluation (Signed)
Anesthesia Post Note  Patient: Kathryn Medina  Procedure(s) Performed: TRANSESOPHAGEAL ECHOCARDIOGRAM (TEE)     Patient location during evaluation: Endoscopy Anesthesia Type: MAC Level of consciousness: awake and alert Pain management: pain level controlled Vital Signs Assessment: post-procedure vital signs reviewed and stable Respiratory status: spontaneous breathing, nonlabored ventilation, respiratory function stable and patient connected to nasal cannula oxygen Cardiovascular status: blood pressure returned to baseline and stable Postop Assessment: no apparent nausea or vomiting Anesthetic complications: no   No notable events documented.  Last Vitals:  Vitals:   11/24/20 1020 11/24/20 1052  BP: 117/63 94/61  Pulse: 63 66  Resp: (!) 23 (!) 22  Temp:  36.7 C  SpO2: 100%     Last Pain:  Vitals:   11/24/20 1052  TempSrc: Oral  PainSc:                  Barnet Glasgow

## 2020-11-24 NOTE — Progress Notes (Addendum)
  Echocardiogram Echocardiogram Transesophageal has been performed.  Roosvelt Maser F 11/24/2020, 10:14 AM

## 2020-11-24 NOTE — Interval H&P Note (Signed)
History and Physical Interval Note:  11/24/2020 9:14 AM  Kathryn Medina  has presented today for surgery, with the diagnosis of BACTEREMIA,IV DRUG USE.  The various methods of treatment have been discussed with the patient and family. After consideration of risks, benefits and other options for treatment, the patient has consented to  Procedure(s): TRANSESOPHAGEAL ECHOCARDIOGRAM (TEE) (N/A) as a surgical intervention.  The patient's history has been reviewed, patient examined, no change in status, stable for surgery.  I have reviewed the patient's chart and labs.  Questions were answered to the patient's satisfaction.     [name]

## 2020-11-24 NOTE — Op Note (Signed)
INDICATIONS: infective endocarditis  PROCEDURE:   Informed consent was obtained prior to the procedure. The risks, benefits and alternatives for the procedure were discussed and the patient comprehended these risks.  Risks include, but are not limited to, cough, sore throat, vomiting, nausea, somnolence, esophageal and stomach trauma or perforation, bleeding, low blood pressure, aspiration, pneumonia, infection, trauma to the teeth and death.    After a procedural time-out, the oropharynx was anesthetized with 20% benzocaine spray.   During this procedure the patient was administered IV propofol and precedex by Anesthesiology, Dr. Richardson Landry.  The transesophageal probe was inserted in the esophagus and stomach without difficulty and multiple views were obtained.  The patient was kept under observation until the patient left the procedure room.  The patient left the procedure room in stable condition.   Agitated microbubble saline contrast was not administered.  COMPLICATIONS:    There were no immediate complications.  FINDINGS:  Large tricuspid valve vegetation and severe tricuspid regurgitation. There is no vegetation on the mitral valve, although there is mild-to-moderate central mitral insufficiency.  RECOMMENDATIONS:    Consider AngioVac tricuspid valve debridement  Time Spent Directly with the Patient:  30 minutes   Kathryn Medina 11/24/2020, 9:55 AM

## 2020-11-24 NOTE — Progress Notes (Signed)
HD#3 SUBJECTIVE:  Patient Summary: Kathryn Medina is a 30 y.o. with a pertinent PMH of polysubstance use and IV drug use, MSSA infective endocarditis of tricuspid valve, DDD with lumbar radiculopathy, and hepatitis C who presented with chest pain and SOB and admitted for septic pulmonary emboli, MSSA bacteremia, and tricuspid valve endocarditis.   Overnight Events: No acute events overnight.  Interim History: This is hospital day 3 for Kathryn Medina who was seen and evaluated at the bedside this morning. She is feeling improved overall. She denies fevers, chills, diaphoresis, chest pain, shortness of breath, loose stools, or any other symptoms at this time. She is feeling tired this morning. Continues to deny back pain at this point and does not wish to pursue the lumbar MRI as ordered previously. TEE was performed today to evaluate vegetation on tricuspid valve and patient is a candidate for angiovac.   OBJECTIVE:  Vital Signs: Vitals:   11/23/20 1554 11/23/20 2129 11/24/20 0006 11/24/20 0438  BP: 110/76 103/71 (!) 98/57 104/65  Pulse: (!) 127 (!) 120 74 69  Resp: 18 19 16 15   Temp: 97.9 F (36.6 C) 98.4 F (36.9 C) 98.4 F (36.9 C) 98.4 F (36.9 C)  TempSrc: Oral Oral Oral Oral  SpO2: 100% 100% 100% 100%  Weight:    60.2 kg  Height:       Supplemental O2: Room Air SpO2: 100 %  Filed Weights   11/21/20 1314 11/23/20 0553 11/24/20 0438  Weight: 63.5 kg 61.4 kg 60.2 kg     Intake/Output Summary (Last 24 hours) at 11/24/2020 11/26/2020 Last data filed at 11/23/2020 2311 Gross per 24 hour  Intake 300 ml  Output --  Net 300 ml   Net IO Since Admission: 1,964 mL [11/24/20 0638]  Physical Exam: General: Chronically ill, tired appearing young female laying in bed. No acute distress.  CV: Regular rate, regular rhythm. No murmurs, rubs, or gallops. No LE edema Pulmonary: Lungs CTAB. Normal effort. No wheezing or rales. Abdominal: Soft, nontender, nondistended. Normal bowel  sounds. Extremities: Palpable radial and DP pulses. Normal ROM. Skin: Warm and dry. Oslers nodes on bilateral palms.  Neuro: A&Ox3. Moves all extremities. Normal sensation. No focal deficit. Pupils 103mm dilated bilaterally. Psych: Normal mood, irritated/anxious affect    ASSESSMENT/PLAN:  Assessment: Principal Problem:   Infective endocarditis Active Problems:   COVID-19   Polysubstance abuse (HCC)   Sepsis (HCC)   MSSA bacteremia   Septic pulmonary embolism (HCC)   Plan: #Septic pulmonary emboli #Tricuspid Valve Infective Endocarditis with MSSA bacteremia #Hx of MSSA and Rosemonas bacteremia Patient has a history of MSSA bacteremia and tricuspid valve infective endocarditis that has not been completely treated due to patient persistently leaving AMA. TEE performed this morning showed large tricuspid valve vegetation with severe TR. No vegetation noted on the mitral valve although mild-moderate mitral insufficiency was noted. Repeat blood cultures as of 8/25 are negative for any growth to date. MRI lumbar spine also still pending, although patient has been declining this.  - Continue IV Ancef 2g q8h for MSSA bacteremia - Xarelto 15 mg BID x21 days restarted on 8/26, followed by 20 mg daily starting on 9/14 - CT surgery consulted for angiovac  - MRI Lumbar Spine if patient agrees - Trending CBC - Cardiac monitoring   #COVID-19 Patient diagnosed with COVID on 8/06 (care everywhere). Likely persistent positive test after recent COVID19 infection. She denies any shortness of breath, cough, or any other respiratory symptoms at this time. No  further management at this time and patient is currently on room air.   #Hypokalemia #Hypomagnesemia Likely 2/2 sweating and poor PO intake. Repeat potassium 4.1 yesterday s/p repletion. Patient also intermittently refusing morning labs. Magnesium low at 1.5 on 8/25, unable to recheck this morning due to patient refusing labs. - Monitor BMP and  replete electrolytes as needed   #Hx of iron deficiency anemia Hb stable at 7.9 on admission. Stable Hgb at 8.4. - Trend CBC - Can consider iron transfusion - Transfuse if Hb <7   #Opioid use disorder #Polysubstance use  Patient has a history of IV drug use with fentanyl, cocaine, heroine, methamphetamines, with last reported use 2 days prior to admission. Patient does not appear to be actively withdrawing at this time and reports no further diaphoresis or loose stools. Oxycodone was discontinued on 8/25 as buprenorphine was started this morning.  - Buprenorphine 2mg  daily started on 8/26 - Continue cyclobenzaprine 5mg  TID for body aches/withdrawal sx, can increase to 10mg  if needed - Continue COWS to monitor for signs of acute withdrawal or intoxication - Nicotine patch   Best Practice: Diet: Regular diet IVF: Fluids: none VTE: therapeutic xarelto resumed after TEE Code: Full AB: Ancef DISPO: Anticipated discharge to Home pending Medical stability.  Signature: 9/26, D.O.  Internal Medicine Resident, PGY-1 Internal Medicine Residency  Pager: [phone] 6:38 AM, 11/24/2020   Please contact the on call pager after 5 pm and on weekends at [phone].

## 2020-11-24 NOTE — Consult Note (Addendum)
[address]       [place] [address]             [phone]        [name] [facility] Medical Record #[id number] Date of Birth: 04/11/1990  Referring: No ref. provider found Primary Care: Pcp, No Primary Cardiologist:None  Chief Complaint:    Chief Complaint  Patient presents with   Chest Pain   Shortness of Breath    History of Present Illness:    We are asked to see this 30 year old female in consideration for possible angio VAC.  The patient has presented to the emergency department on several occasions and also signed out AGAINST MEDICAL ADVICE previously.  She originally presented on 11/19/2020 with symptoms of pain, myalgia and generally feeling unwell.  She has a recent history of endocarditis and was also noted to have COVID on 11/04/2020.   She has a history of polysubstance use which has also complicated her care.  Other comorbidities include degenerative disc disease with lumbar radiculopathy and hepatitis C.  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  She has been subsequently transitioned to Ancef.  Prior to obtaining an echocardiogram the patient left AGAINST MEDICAL ADVICE . She then represented with nonradiating substernal chest pain and dyspnea.  She also complained of diffuse body aches and lower back pain that she attributes to known disc herniation.  She denies fevers or chills.  She denied abdominal pain, nausea vomiting, or diarrhea.   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  She also has a use of methamphetamine and cocaine.  She has had multiple admissions for bacteremia starting in May.  In her hospitalization in May she was found to have a vegetation on the tricuspid valve by TEE.  She has had positive cultures for MSSA and Roseomonas.  TTE on 8/8 showed increased growth in the  vegetation.  She has also been found to have septic pulmonary emboli with multiple partial cavitary nodules on CTA.  An MRI done in May showed no evidence of infection on the lumbar spine at that time.  She is noted to have a significant iron deficiency anemia.  A TEE was performed today by Dr. Dulce Sellar and this reconfirmed a large tricuspid valve vegetation and severe tricuspid regurgitation.  There was no vegetation on the mitral valve, although there is mild to moderate central mitral insufficiency.  Note-history gleaned from the chart as patient refusing to have conversation with the exception of occasionally shaking head yes or no.  Current Activity/ Functional Status: Patient is independent with mobility/ambulation, transfers, ADL's, IADL's.   Zubrod Score: At the time of surgery this patient's most appropriate activity status/level should be described as: []     0    Normal activity, no symptoms []     1    Restricted in physical strenuous activity but ambulatory, able to do out light work []     2    Ambulatory and capable of self care, unable to do work activities, up and about                 more than 50%  Of the time                            []   3    Only limited self care, in bed greater than 50% of waking hours []     4    Completely disabled, no self care, confined to bed or chair Feel a little quick movement without reconsult probably missing MI is missing little rubber piece but it does click    5    Moribund  Past Medical History:  Diagnosis Date   Asthma    does not use inhaler    Past Surgical History:  Procedure Laterality Date   DILATION AND EVACUATION  03/22/2012   Procedure: DILATATION AND EVACUATION;  Surgeon: 03/24/2012, MD;  Location: [facility];  Service: Gynecology;  Laterality: N/A;    Social History   Tobacco Use  Smoking Status Every Day   Packs/day: 0.50   Years: 10.00   Pack years: 5.00   Types: Cigarettes  Smokeless Tobacco Never  Tobacco  Comments   pt is trying to use the electronic cigarette    Social History   Substance and Sexual Activity  Alcohol Use Yes   Comment: social, 2 drinks/day     Allergies  Allergen Reactions   Other Hives    pork    Current Facility-Administered Medications  Medication Dose Route Frequency Provider Last Rate Last Admin   0.9 %  sodium chloride infusion (Manually program via Guardrails IV Fluids)   Intravenous Once Croitoru, Mihai, MD       acetaminophen (TYLENOL) tablet 650 mg  650 mg Oral Q6H PRN Croitoru, Mihai, MD   650 mg at 11/21/20 2257   Or   acetaminophen (TYLENOL) suppository 650 mg  650 mg Rectal Q6H PRN Croitoru, Mihai, MD       buprenorphine (SUBUTEX) SL tablet 2 mg  2 mg Sublingual Daily Croitoru, Mihai, MD   2 mg at 11/24/20 1050   ceFAZolin (ANCEF) IVPB 2g/100 mL premix  2 g Intravenous Q8H Croitoru, Mihai, MD 200 mL/hr at 11/24/20 0544 2 g at 11/24/20 0544   cyclobenzaprine (FLEXERIL) tablet 5 mg  5 mg Oral TID Croitoru, Mihai, MD   5 mg at 11/24/20 1049   nicotine (NICODERM CQ - dosed in mg/24 hours) patch 14 mg  14 mg Transdermal Daily Croitoru, Mihai, MD       senna-docusate (Senokot-S) tablet 1 tablet  1 tablet Oral QHS PRN Croitoru, Mihai, MD       sodium chloride flush (NS) 0.9 % injection 3 mL  3 mL Intravenous Q12H Croitoru, Mihai, MD   3 mL at 11/23/20 2130    No medications prior to admission.    Family History  Problem Relation Age of Onset   Cancer Paternal Grandfather    Cancer Paternal Grandmother    Emphysema Maternal Grandmother    Hypertension Father    Cancer Father      Review of Systems:   Review of Systems  Reason unable to perform ROS: refusing conversation, does shake head yes/no at times.         Physical Exam: BP 94/61   Pulse 63   Temp 98.1 F (36.7 C) (Temporal)   Resp (!) 23   Ht 5\' 8"  (1.727 m)   Wt 63.5 kg   LMP 11/16/2020   SpO2 100%   BMI 21.29 kg/m    General appearance: distracted, fatigued, and no  distress Head: Normocephalic, without obvious abnormality, atraumatic Neck: no adenopathy, no carotid bruit, no JVD, supple, symmetrical, trachea midline, and thyroid not enlarged, symmetric, no tenderness/mass/nodules Lymph nodes: Cervical, supraclavicular, and  axillary nodes normal. Resp: clear to auscultation bilaterally Back: symmetric, no curvature. ROM normal. No CVA tenderness. Cardio: regular rate and rhythm and no murmur appreciated GI: soft, non-tender; bowel sounds normal; no masses,  no organomegaly Extremities: no edema or splinter hemmorhages/petichae Neurologic: Grossly normal  Diagnostic Studies & Laboratory data:     Recent Radiology Findings:   ECHO TEE  Result Date: 11/24/2020    TRANSESOPHOGEAL ECHO REPORT   Patient Name:   Kathryn Medina Date of Exam: 11/24/2020 Medical Rec #:  [id number]        Height:       68.0 in Accession #:    [id number]       Weight:       132.7 lb Date of Birth:  12/04/1990         BSA:          1.717 m Patient Age:    30 years         BP:           118/66 mmHg Patient Gender: F                HR:           91 bpm. Exam Location:  Inpatient Procedure: Transesophageal Echo, Cardiac Doppler and Color Doppler Indications:     Known veg on TV, R/O veg on MV  History:         Patient has prior history of Echocardiogram examinations, most                  recent 11/21/2020. 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  Sonographer:     Roosvelt Maserachel Lane RDCS Referring Phys:  [id number] Diagnosing Phys: Thurmon FairMihai Croitoru MD PROCEDURE: The transesophogeal probe was passed without difficulty through the esophogus of the patient. Sedation performed by different physician. The patient was monitored while under deep sedation. The patient's vital signs; including heart rate, blood pressure, and oxygen saturation; remained stable throughout the procedure. The patient developed no complications during the procedure. IMPRESSIONS  1. Left ventricular ejection fraction, by estimation, is 60 to  65%. The left ventricle has normal function. The left ventricle has no regional wall motion abnormalities.  2. Right ventricular systolic function is normal. The right ventricular size is normal. There is moderately elevated pulmonary artery systolic pressure.  3. Left atrial size was moderately dilated. No left atrial/left atrial appendage thrombus was detected.  4. Right atrial size was moderately dilated.  5. A small pericardial effusion is present. The pericardial effusion is circumferential. There is no evidence of cardiac tamponade.  6. No mitral valve vegetations are sen. The mitral valve is normal in structure. Mild to moderate mitral valve regurgitation. No evidence of mitral stenosis.  7. Tricuspid valve regurgitation is severe.  8. The aortic valve is tricuspid. Aortic valve regurgitation is not visualized. No aortic stenosis is present.  9. The inferior vena cava is dilated in size with <50% respiratory variability, suggesting right atrial pressure of 15 mmHg. Conclusion(s)/Recommendation(s): Findings are concerning for vegetation/infective endocarditis as detailed above. Findings concerning for tricuspid valve vegetation. FINDINGS  Left Ventricle: Left ventricular ejection fraction, by estimation, is 60 to 65%. The left ventricle has normal function. The left ventricle has no regional wall motion abnormalities. The left ventricular internal cavity size was normal in size. There is  no left ventricular hypertrophy. Right Ventricle: The right ventricular size is normal. No increase in right ventricular wall thickness. Right ventricular systolic function is  normal. There is moderately elevated pulmonary artery systolic pressure. The tricuspid regurgitant velocity is 2.92 m/s, and with an assumed right atrial pressure of 15 mmHg, the estimated right ventricular systolic pressure is 49.1 mmHg. Left Atrium: Left atrial size was moderately dilated. No left atrial/left atrial appendage thrombus was detected.  Right Atrium: Right atrial size was moderately dilated. Pericardium: A small pericardial effusion is present. The pericardial effusion is circumferential. There is no evidence of cardiac tamponade. Mitral Valve: No mitral valve vegetations are sen. The mitral valve is normal in structure. Mild to moderate mitral valve regurgitation, with centrally-directed jet. No evidence of mitral valve stenosis. Tricuspid Valve: There is a highly mobile 27x9 mm vegetation on the anterior tricuspid leaflet. The tricuspid valve is normal in structure. Tricuspid valve regurgitation is severe. No evidence of tricuspid stenosis. Aortic Valve: The aortic valve is tricuspid. Aortic valve regurgitation is not visualized. No aortic stenosis is present. Pulmonic Valve: The pulmonic valve was normal in structure. Pulmonic valve regurgitation is trivial. No evidence of pulmonic stenosis. Aorta: The aortic root is normal in size and structure and the aortic root, ascending aorta and aortic arch are all structurally normal, with no evidence of dilitation or obstruction. Venous: The inferior vena cava is dilated in size with less than 50% respiratory variability, suggesting right atrial pressure of 15 mmHg. IAS/Shunts: No atrial level shunt detected by color flow Doppler.  TRICUSPID VALVE TR Peak grad:   34.1 mmHg TR Vmax:        292.00 cm/s Mihai Croitoru MD Electronically signed by Thurmon Fair MD Signature Date/Time: 11/24/2020/10:31:52 AM    Final      I have independently reviewed the above radiologic studies and discussed with the patient   Recent Lab Findings: Lab Results  Component Value Date   WBC 11.2 (H) 11/23/2020   HGB 8.4 (L) 11/23/2020   HCT 25.8 (L) 11/23/2020   PLT 390 11/23/2020   GLUCOSE 103 (H) 11/23/2020   ALT 10 11/21/2020   AST 19 11/21/2020   NA 133 (L) 11/23/2020   K 4.1 11/23/2020   CL 105 11/23/2020   CREATININE 0.51 11/23/2020   BUN <5 (L) 11/23/2020   CO2 20 (L) 11/23/2020   TSH 1.030 02/20/2016    INR 1.4 (H) 11/21/2020   HGBA1C 4.9 02/20/2016      Assessment / Plan: Tricuspid valve endocarditis, MSSA bacteremia Substance abuse disorder Asthma Recent COVID-19 infection History of sepsis including septic pulmonary emboli History of hepatitis C  Plan: Angio VAC debridement of the tricuspid valve next Monday if she is agreeable.  She is quite stoic and did not answer many questions but is potentially agreeable to drug rehabilitation.  This is certainly her root cause for her multiple comorbidities.   I  spent 25 minutes counseling the patient face to face.   Rowe Clack, PA-C  11/24/2020 11:14 AM     Agree with above.  30 year old female with tricuspid valve endocarditis and a large mobile vegetation.  She does have mild to moderate MR but there is no vegetation located on the mitral valve.  She is agreeable to proceed with angio vac debridement the tricuspid valve.  She is scheduled for Monday.  Trayquan Kolakowski Keane Scrape

## 2020-11-24 NOTE — Progress Notes (Signed)
    [facility]   Date of Admission:  11/21/2020     Reason for visit: Follow up on MSSA bacteremia  Interval History: No acute events. S/p TEE today with no veggie seen on MV. Large TV lesion and severe TR noted.  Afebrile (Tmax 98.4).  MRI lumbar spine order pending for now.  Pt continues on cefazolin.  Pt not seen today due to being in TEE procedure.  Assessment: MSSA bacteremia with tricuspid valve endocarditis and pulmonary septic emboli: TEE negative for MV vegetation.  Repeat blood cultures 8/25 NGTD.  MRI lumbar spine pending to further evaluate her previous complaint of back pain concerning for possible discitis/OM. COVID-19: Currently on room air.  S/p remdesivir. Opiate use disorder  Recommendations: -- continue ancef -- f/u blood cx, MRI -- f/u CT surgery recs re: angiovac -- Dr Luciana Axe available as needed over the weekend.  Dr Luciana Axe or Elinor Parkinson will be back Monday   Lady Deutscher [facility] [phone] pager 11/24/2020, 11:36 AM

## 2020-11-25 ENCOUNTER — Encounter (HOSPITAL_COMMUNITY): Payer: Self-pay | Admitting: Cardiovascular Disease

## 2020-11-25 LAB — TYPE AND SCREEN
ABO/RH(D): O POS
Antibody Screen: NEGATIVE
Unit division: 0
Unit division: 0

## 2020-11-25 LAB — BPAM RBC
Blood Product Expiration Date: 202209172359
Blood Product Expiration Date: 202209202359
ISSUE DATE / TIME: 202208200324
ISSUE DATE / TIME: 202208200324
Unit Type and Rh: 5100
Unit Type and Rh: 5100

## 2020-11-25 MED ORDER — RIVAROXABAN (XARELTO) VTE STARTER PACK (15 & 20 MG): ORAL_TABLET | ORAL | 0 refills | Status: DC

## 2020-11-25 MED ORDER — CEPHALEXIN 500 MG PO CAPS: 500.0000 mg | ORAL_CAPSULE | Freq: Four times a day (QID) | ORAL | 0 refills | Status: DC

## 2020-11-25 MED ORDER — BUPRENORPHINE HCL 2 MG SL SUBL
8.0000 mg | SUBLINGUAL_TABLET | Freq: Every day | SUBLINGUAL | Status: DC
  Administered 2020-11-25: 8 mg via SUBLINGUAL
  Filled 2020-11-25: qty 4

## 2020-11-25 NOTE — Progress Notes (Addendum)
Pt signed AMA form.  Instructed her to pick up prescription meds at [facility] on [address].  Hinton Dyer, RN

## 2020-11-25 NOTE — Discharge Summary (Signed)
Name: Kathryn Medina MRN: [id number] DOB: 1991-01-17 30 y.o. PCP: Pcp, No  Date of Admission: 11/21/2020  5:33 AM Date of Discharge:  LEFT AMA ON 11/25/2020 Attending Physician: Dr. Cleda Daub  DISCHARGE DIAGNOSIS:  Primary Problem: Infective endocarditis   Hospital Problems: Principal Problem:   Infective endocarditis Active Problems:   COVID-19   Polysubstance abuse (HCC)   Sepsis (HCC)   MSSA bacteremia   Septic pulmonary embolism (HCC)    DISCHARGE MEDICATIONS:   Allergies as of 11/25/2020       Reactions   Other Hives   pork        Medication List     TAKE these medications    cephALEXin 500 MG capsule Commonly known as: KEFLEX Take 1 capsule (500 mg total) by mouth 4 (four) times daily.   Rivaroxaban Stater Pack (15 mg and 20 mg) Commonly known as: XARELTO STARTER PACK Follow package directions: Take one  tablet by mouth twice a day. On day 22, switch to one  tablet once a day. Take with food.        DISPOSITION AND FOLLOW-UP:  Ms.Tawna Rolene Andrades was discharged from [facility] in Critical condition. At the hospital follow up visit please address:  Follow-up Recommendations: Consults: Cardiology Labs: Basic Metabolic Profile and Hemoglobin Studies: None Medications: Keflex  4 times daily x 2 months and xarelto therapeutic starter pack for PE treatment  Follow-up Appointments:  Follow-up Information     Primary Care at [facility] Follow up.   Specialty: Family Medicine Why: Please call and make an appointment to establish primary care doctor Contact information: [address] [place] [address] [phone]                HOSPITAL COURSE:  Patient Summary: Kathryn Medina is a 30 y.o. with a pertinent PMH of polysubstance use and IV drug use, MSSA infective endocarditis of tricuspid valve, DDD with lumbar radiculopathy, and hepatitis C who presented with chest pain  and SOB and admitted for septic pulmonary emboli, MSSA bacteremia, and tricuspid valve endocarditis.   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 Echo on 8/23 similar to echo on 8/8, showing 2cm tricuspid valve vegetation, with LVEF of 50%. Patient also has been endorsing ongoing low back pain, but the patient declined her MRI Lumbar Spine and continues to decline this, when offered. Patient is also refusing Lovenox injections, and thus, we will switched her therapy to oral Xarelto for her septic pulmonary emboli. Patient received IV Ancef 2g q8h for MSSA bacteremia. Had TEE on 8/26 which showed large tricuspid valve vegetation and severe TR. No vegetation noted on the mitral valve, although there is mild to moderate mitral insufficiency. CT surgery consulted for angiovac and will perform this on 8/29. Patient did not want to stay and stated that she was going to have a ride pick her up on 8/27 and she is going to sign out AMA. Discussed the risks of doing this, including risk of stroke and death, and the patient expressed understanding. She does not wish to stay for any further treatment. Offered IV linezolid and oritavancin prior to discharge, but the patient refused. Will send patient on oral Keflex  4 times daily for 2 months per ID recommendations and therapeutic  xarelto for septic PE. Also expressed that if the patient returns again for treatment, there is no guarantee she can still get her surgery on Monday as previously scheduled. Patient again expressed understanding and is still wishing to leave AMA.    #COVID-19 Patient diagnosed with COVID on 8/6 according to care everywhere. She denies any shortness of breath, cough, or any other respiratory symptoms at  this time, but she does feel fatigued. Remdesivir therapy started on 8/24 and was discontinued once original covid test result was found.    #Hypokalemia #Hypomagnesemia Likely 2/2 sweating and poor PO intake. Potassium 2.9 in ED without evidence of EKG changes. Patient did get some K+ repletion, but refused last 2 doses. Repeat potassium improved to 4.1. Mg found to be low at 1.5, although unable to recheck after patient was given Mg repletion due to patient refusing labs.   #Hx of iron deficiency anemia Hb stable at 7.9 on admission. Can consider iron transfusion once bacteremia resolves     #Opioid use disorder #Polysubstance use  Patient has a history of IV drug use with fentanyl, cocaine, heroine, methamphetamines, with last reported use 2 days prior to admission. The patient was initially started on a PCA pump on admission, however, she became very tired and frequently was falling asleep upon interview. PCA pump was discontinued and she was started on oral pain regimen. Oral oxycodone was discontinued on 8/25 and the patient was started on buprenorphine 2 mg on 8/26 and this was increased to  on 8/27.    DISCHARGE INSTRUCTIONS:  FOLLOW-UP INSTRUCTIONS:  Thank you for allowing [place] to be part of your care. You were hospitalized for Infective endocarditis.  Please follow up with the following providers: A. Primary Care at [facility]; please call to make an appt to establish with a primary care physician: [phone]   Please note these changes made to your medications:   A. Medications to start Current Meds  Medication Sig   cephALEXin (KEFLEX) 500 MG capsule Take 1 capsule (500 mg total) by mouth 4 (four) times daily.   RIVAROXABAN (XARELTO) VTE STARTER PACK (15 & 20 MG) Follow package directions: Take one  tablet by mouth twice a day. On day 22, switch to one  tablet once a day. Take with food.          As previously advised, it is strongly recommended that you  are to stay in the hospital for IV antibiotics and the angio vac surgery on Monday, 8/29. There are significant risks associated with leaving the hospital against medical advice, especially considering the extent of your infection. There is no guarantee you can get your surgery on Monday if you do decide to return to the hospital.  Please call our clinic if you have any questions or concerns, we may be able to help and keep you from a long and expensive emergency room wait. Our clinic and after hours phone number is [phone], the best time to call is Monday through Friday 9 am to 4 pm but there is always someone available 24/7 if you have an emergency. If you need medication refills please notify your pharmacy one week in advance and they will send [place] a request.       SUBJECTIVE:  This is hospital day 4 for Jinny Blossom who was seen and evaluated at the bedside this morning. Reports wanting to go home today. Discussed getting the AngioVac performed on Monday to get the bacteria off of her valve and  she nodded and expressed understanding. Patient does express some cravings at this time and also endorses diaphoresis and nausea, although she is still able to tolerate PO intake and has had no vomiting or diarrhea. When asking if buprenorphine helped yesterday, she is unsure but did not feel any worse on it. States that it has helped a little in the past.   Discharge Vitals:   BP (!) 104/56 (BP Location: Right Arm)   Pulse 68   Temp 98.8 F (37.1 C) (Oral)   Resp 18   Ht 5\' 8"  (1.727 m)   Wt 59.4 kg   LMP 11/16/2020   SpO2 99%   BMI 19.91 kg/m   OBJECTIVE:  General: Chronically ill, tired appearing young female laying in bed. No acute distress. Does not answer many questions CV: Regular rate, regular rhythm. No murmurs, rubs, or gallops. No LE edema Pulmonary: Lungs CTAB. Normal effort. No wheezing or rales. Abdominal: Soft, nontender, nondistended. Normal bowel sounds. Extremities:  Palpable radial and DP pulses. Normal ROM. Skin: Warm and dry. Oslers nodes on bilateral palms.  Neuro: A&Ox3. Moves all extremities. Normal sensation. No focal deficit. Pupils 23mm dilated bilaterally. Psych: Normal mood, flat affect   Pertinent Labs, Studies, and Procedures:  CBC Latest Ref Rng & Units 11/23/2020 11/21/2020 11/19/2020  WBC 4.0 - 10.5 K/uL 11.2(H) 17.6(H) 16.3(H)  Hemoglobin 12.0 - 15.0 g/dL 11/21/2020) 7.9(L) 6.8(LL)  Hematocrit 36.0 - 46.0 % 25.8(L) 24.5(L) 21.1(L)  Platelets 150 - 400 K/uL 390 524(H) 357    CMP Latest Ref Rng & Units 11/23/2020 11/21/2020 11/19/2020  Glucose 70 - 99 mg/dL 11/21/2020) 124(P) 809(X)  BUN 6 - 20 mg/dL 833(A) <2(N) <0(N)  Creatinine 0.44 - 1.00 mg/dL <3(Z 7.67 3.41  Sodium 135 - 145 mmol/L 133(L) 131(L) 129(L)  Potassium 3.5 - 5.1 mmol/L 4.1 2.7(LL) 2.8(L)  Chloride 98 - 111 mmol/L 105 95(L) 96(L)  CO2 22 - 32 mmol/L 20(L) 25 26  Calcium 8.9 - 10.3 mg/dL 7.8(L) 8.0(L) 7.5(L)  Total Protein 6.5 - 8.1 g/dL - 6.4(L) 5.4(L)  Total Bilirubin 0.3 - 1.2 mg/dL - 0.5 0.9  Alkaline Phos 38 - 126 U/L - 80 56  AST 15 - 41 U/L - 19 12(L)  ALT 0 - 44 U/L - 10 8    CT Head Wo Contrast  Result Date: 11/21/2020 CLINICAL DATA:  30 year old female with altered mental status, "Blood infection". EXAM: CT HEAD WITHOUT CONTRAST TECHNIQUE: Contiguous axial images were obtained from the base of the skull through the vertex without intravenous contrast. COMPARISON:  None. FINDINGS: Brain: Normal cerebral volume. No midline shift, ventriculomegaly, mass effect, evidence of mass lesion, intracranial hemorrhage or evidence of cortically based acute infarction. Gray-white matter differentiation is within normal limits throughout the brain. Vascular: No suspicious intracranial vascular hyperdensity. Skull: No acute osseous abnormality identified. Sinuses/Orbits: Opacified right middle ear and mastoids. No bone erosion is evident. Left middle ear, mastoids and paranasal sinuses are  well aerated. Other: Mildly Disconjugate gaze. Otherwise negative orbit and scalp soft tissues. IMPRESSION: 1. Normal noncontrast CT appearance of the brain. 2. Opacified right middle ear and mastoids, consider Acute Otitis Media with mastoid effusion. No bone erosion is evident. Electronically Signed   By: 26 M.D.   On: 11/21/2020 07:48   CT Angio Chest Pulmonary Embolism (PE) W or WO Contrast  Result Date: 11/21/2020 CLINICAL DATA:  Concern for pulmonary embolism. EXAM: CT ANGIOGRAPHY CHEST WITH CONTRAST TECHNIQUE: Multidetector CT imaging of the chest was performed using the  standard protocol during bolus administration of intravenous contrast. Multiplanar CT image reconstructions and MIPs were obtained to evaluate the vascular anatomy. CONTRAST:  56 mL OMNIPAQUE IOHEXOL 350 MG/ML SOLN COMPARISON:  None. FINDINGS: Vascular Findings: There is adequate opacification of the pulmonary arterial system with the main pulmonary artery measuring 274 Hounsfield units. There is a moderate-sized occlusive filling defect involving near the entirety of the right lower lobe pulmonary artery (axial image 67, series 6; coronal image 65, series 9) with preserved opacification of the right anterior and medial basilar segmental pulmonary arteries but with non opacification of the right posterior and lateral segmental arteries. There is an addition small occlusive filling defect involving the posterior basilar segmental artery of the left lower lobe (axial image 83, series 6; coronal image 84, series 9). Normal caliber of the main pulmonary artery. There is no definitive intraventricular septal bowing to suggest right-sided heart failure. Cardiomegaly.  No pericardial effusion. No evidence of thoracic aortic aneurysm or dissection on this nongated examination. Conventional configuration of the aortic arch. Review of the MIP images confirms the above findings.  ---------------------------------------------------------------------------------- Nonvascular Findings: Mediastinum/Lymph Nodes: Ill-defined soft tissue within the anterior mediastinum without associated mass effect and favored to represent residual thymic tissue. No definitive bulky mediastinal or hilar adenopathy though evaluation degraded secondary to lack mediastinal fat. No axillary lymphadenopathy. Lungs/Pleura: Bilateral nodular consolidative airspace opacities, worse within the right lower lobe with several of the nodular consolidative opacities demonstrating central cavitation worrisome for multifocal pneumonia, suggestive of septic pulmonary emboli. Trace bilateral pleural effusions. Minimal biapical paraseptal emphysematous change. Scattered air bronchograms are seen within the perihilar consolidation of the right lower lobe however the central pulmonary airways remain patent. No pneumothorax. Upper abdomen: Limited early arterial phase evaluation of the upper abdomen is unremarkable. Musculoskeletal: No acute or aggressive osseous abnormalities. Regional soft tissues appear normal. Normal appearance of the thyroid gland. IMPRESSION: 1. Bilateral nodular consolidative opacities, many of which demonstrate central cavitation worrisome for multifocal infection, specifically, septic pulmonary emboli. As this finding is associated with cardiomegaly, further evaluation with cardiac echo and/or TEE could be performed as indicated. 2. The examination is positive for pulmonary embolism involving the right lower lobe pulmonary artery as well as the left posterior basilar segmental pulmonary artery. Overall clot burden is deemed small in volume and there is no CT evidence of right-sided heart strain. 3.  Emphysema (ICD10-J43.9). Electronically Signed   By: Simonne Come M.D.   On: 11/21/2020 10:16   DG Chest Port 1 View  Result Date: 11/21/2020 CLINICAL DATA:  30 year old female with sepsis.  Endocarditis. EXAM:  PORTABLE CHEST 1 VIEW COMPARISON:  Chest x-ray 11/18/2020. FINDINGS: Worsening airspace consolidation in the right mid to lower lung, likely involving portions of both the right middle and lower lobes. Additional nodular opacity in the left mid lung. Small right pleural effusion. No pneumothorax. No evidence of pulmonary edema. Cardiac silhouette is enlarged. Upper mediastinal contours are within normal limits. IMPRESSION: 1. Worsening multilobar pneumonia, most severe in the right middle and lower lobes. 2. Nodular density in the left mid lung. Given the patient's history of endocarditis, the possibility of septic embolus should be considered. Electronically Signed   By: Trudie Reed M.D.   On: 11/21/2020 06:04   ECHOCARDIOGRAM COMPLETE  Result Date: 11/21/2020    ECHOCARDIOGRAM REPORT   Patient Name:   [facility] Date of Exam: 11/21/2020 Medical Rec #:  [id number]        Height:  68.0 in Accession #:    [id number]       Weight:       121.9 lb Date of Birth:  03/01/1991         BSA:          1.656 m Patient Age:    30 years         BP:           108/80 mmHg Patient Gender: F                HR:           125 bpm. Exam Location:  Inpatient Procedure: 2D Echo, Color Doppler and Cardiac Doppler Indications:    Endocarditis I38  History:        Patient has prior history of Echocardiogram examinations, most                 recent 11/06/2020. Signs/Symptoms:Shortness of Breath. History of                 substance abuse, COVID 19.  Sonographer:    Leta Junglingiffany Cooper RDCS Referring Phys: [phone] ELIZABETH REES  Sonographer Comments: Technically difficult study due to poor echo windows. IMPRESSIONS  1. 2 cm tricuspid valve vegetation. The tricuspid valve is abnormal. Tricuspid valve regurgitation is severe.  2. Left ventricular ejection fraction, by estimation, is 50%. The left ventricle has low normal function. The left ventricle has no regional wall motion abnormalities. There is mild left ventricular hypertrophy.  Left ventricular diastolic parameters are  indeterminate.  3. Right ventricular systolic function is mildly reduced. The right ventricular size is moderately enlarged. There is normal pulmonary artery systolic pressure. The estimated right ventricular systolic pressure is 16.0 mmHg.  4. Left atrial size was mildly dilated.  5. Right atrial size was mild to moderately dilated.  6. A small pericardial effusion is present. There is no evidence of cardiac tamponade.  7. The mitral valve is grossly normal. Mild to moderate mitral valve regurgitation. No evidence of mitral stenosis.  8. The aortic valve is tricuspid. Aortic valve regurgitation is not visualized.  9. The inferior vena cava is dilated in size with >50% respiratory variability, suggesting right atrial pressure of 8 mmHg. FINDINGS  Left Ventricle: Left ventricular ejection fraction, by estimation, is 50%. The left ventricle has low normal function. The left ventricle has no regional wall motion abnormalities. The left ventricular internal cavity size was normal in size. There is mild left ventricular hypertrophy. Left ventricular diastolic parameters are indeterminate. Right Ventricle: The right ventricular size is moderately enlarged. No increase in right ventricular wall thickness. Right ventricular systolic function is mildly reduced. There is normal pulmonary artery systolic pressure. The tricuspid regurgitant velocity is 1.41 m/s, and with an assumed right atrial pressure of 8 mmHg, the estimated right ventricular systolic pressure is 16.0 mmHg. Left Atrium: Left atrial size was mildly dilated. Right Atrium: Right atrial size was mild to moderately dilated. Pericardium: A small pericardial effusion is present. There is no evidence of cardiac tamponade. Mitral Valve: The mitral valve is grossly normal. Mild to moderate mitral valve regurgitation. No evidence of mitral valve stenosis. Tricuspid Valve: 2 cm tricuspid valve vegetation. The tricuspid valve is  abnormal. Tricuspid valve regurgitation is severe. Aortic Valve: The aortic valve is tricuspid. Aortic valve regurgitation is not visualized. Pulmonic Valve: The pulmonic valve was normal in structure. Pulmonic valve regurgitation is trivial. Aorta: The aortic root is normal in size and structure. Venous: The  inferior vena cava is dilated in size with greater than 50% respiratory variability, suggesting right atrial pressure of 8 mmHg. IAS/Shunts: No atrial level shunt detected by color flow Doppler.  LEFT VENTRICLE PLAX 2D LVIDd:         4.80 cm LVIDs:         2.90 cm LV PW:         1.00 cm LV IVS:        1.10 cm LVOT diam:     2.00 cm LV SV:         48 LV SV Index:   29 LVOT Area:     3.14 cm  RIGHT VENTRICLE TAPSE (M-mode): 2.2 cm LEFT ATRIUM           Index LA diam:      2.80 cm 1.69 cm/m LA Vol (A4C): 25.9 ml 15.64 ml/m  AORTIC VALVE LVOT Vmax:   97.50 cm/s LVOT Vmean:  75.400 cm/s LVOT VTI:    0.153 m  AORTA Ao Root diam: 3.20 cm Ao Asc diam:  3.10 cm TRICUSPID VALVE TR Peak grad:   8.0 mmHg TR Vmax:        141.00 cm/s  SHUNTS Systemic VTI:  0.15 m Systemic Diam: 2.00 cm Weston Brass MD Electronically signed by Weston Brass MD Signature Date/Time: 11/21/2020/3:26:38 PM    Final      Signed: Elza Rafter, D.O.  Internal Medicine Resident, PGY-1 [facility]  Pager: [phone] 11:32 AM, 11/25/2020

## 2020-11-25 NOTE — Discharge Instructions (Signed)
FOLLOW-UP INSTRUCTIONS:  Thank you for allowing [place] to be part of your care. You were hospitalized for Infective endocarditis.  Please follow up with the following providers: A. Primary Care at [facility]; please call to make an appt to establish with a primary care physician: [phone]   Please note these changes made to your medications:   A. Medications to start Current Meds  Medication Sig   cephALEXin (KEFLEX) 500 MG capsule Take 1 capsule (500 mg total) by mouth 4 (four) times daily.   RIVAROXABAN (XARELTO) VTE STARTER PACK (15 & 20 MG) Follow package directions: Take one 15mg  tablet by mouth twice a day. On day 22, switch to one 20mg  tablet once a day. Take with food.          As previously advised, it is strongly recommended that you are to stay in the hospital for IV antibiotics and the angio vac surgery on Monday, 8/29. There are significant risks associated with leaving the hospital against medical advice, especially considering the extent of your infection. There is no guarantee you can get your surgery on Monday if you do decide to return to the hospital.  Please call our clinic if you have any questions or concerns, we may be able to help and keep you from a long and expensive emergency room wait. Our clinic and after hours phone number is [phone], the best time to call is Monday through Friday 9 am to 4 pm but there is always someone available 24/7 if you have an emergency. If you need medication refills please notify your pharmacy one week in advance and they will send Saturday a request.

## 2020-11-25 NOTE — Progress Notes (Signed)
HD#4 SUBJECTIVE:  Patient Summary: Kathryn Medina is a 30 y.o. with a pertinent PMH of polysubstance use and IV drug use, MSSA infective endocarditis of tricuspid valve, DDD with lumbar radiculopathy, and hepatitis C who presented with chest pain and SOB and admitted for septic pulmonary emboli, MSSA bacteremia, and tricuspid valve endocarditis  Overnight Events: No acute events overnight  Interim History: This is hospital day 4 for Kathryn Medina who was seen and evaluated at the bedside this morning. Reports wanting to go home today. Discussed getting the AngioVac performed on Monday to get the bacteria off of her valve and she nodded and expressed understanding. Patient does express some cravings at this time and also endorses diaphoresis and nausea, although she is still able to tolerate PO intake and has had no vomiting or diarrhea. When asking if buprenorphine helped yesterday, she is unsure but did not feel any worse on it. States that it has helped a little in the past.   OBJECTIVE:  Vital Signs: Vitals:   11/24/20 1637 11/24/20 1955 11/24/20 2345 11/25/20 0534  BP: 110/68 117/66 99/64 (!) 104/56  Pulse: 79 84 84 68  Resp: 18 20 19 18   Temp: 98.1 F (36.7 C) 98.4 F (36.9 C) 98.2 F (36.8 C) 98.8 F (37.1 C)  TempSrc: Oral Oral Oral Oral  SpO2: 100% 99%    Weight:    59.4 kg  Height:       Supplemental O2: Room Air SpO2: 99 %  Filed Weights   11/24/20 0438 11/24/20 0855 11/25/20 0534  Weight: 60.2 kg 63.5 kg 59.4 kg     Intake/Output Summary (Last 24 hours) at 11/25/2020 11/27/2020 Last data filed at 11/25/2020 0445 Gross per 24 hour  Intake 600 ml  Output 0 ml  Net 600 ml   Net IO Since Admission: 2,564 mL [11/25/20 0624]  Physical Exam: General: Chronically ill, tired appearing young female laying in bed. No acute distress. Does not answer many questions CV: Regular rate, regular rhythm. No murmurs, rubs, or gallops. No LE edema Pulmonary: Lungs CTAB. Normal  effort. No wheezing or rales. Abdominal: Soft, nontender, nondistended. Normal bowel sounds. Extremities: Palpable radial and DP pulses. Normal ROM. Skin: Warm and dry. Oslers nodes on bilateral palms.  Neuro: A&Ox3. Moves all extremities. Normal sensation. No focal deficit. Pupils 68mm dilated bilaterally. Psych: Normal mood, flat affect      ASSESSMENT/PLAN:  Assessment: Principal Problem:   Infective endocarditis Active Problems:   COVID-19   Polysubstance abuse (HCC)   Sepsis (HCC)   MSSA bacteremia   Septic pulmonary embolism (HCC)   Plan: #Septic pulmonary emboli #Tricuspid Valve Infective Endocarditis with MSSA bacteremia #Hx of MSSA and Rosemonas bacteremia Repeat blood cultures from 8/25 are negative for any growth to date at 24 hours. Patient is no longer endorsing any low back pain and never had the MRI completed. Patient continues to deny any chest pain or shortness of breath. Plan for angio vac debridement of the tricuspid valve on 8/29 with CT surgery. - Continue IV Ancef 2g q8h for MSSA bacteremia - Xarelto held 8/26-8/29 in the setting of angiovac on 8/29 - MRI Lumbar Spine if patient agrees - Trending CBC as able - Cardiac monitoring   #COVID-19 Patient diagnosed with COVID on 8/06 (care everywhere). Likely persistent positive test after recent COVID19 infection. She denies any shortness of breath, cough, or any other respiratory symptoms at this time. No further management at this time and patient is currently on  room air.   #Hypokalemia #Hypomagnesemia Likely 2/2 sweating and poor PO intake. Have been unable to reassess electrolytes since 8/25 as patient has been declining lab work. 8/25 K was improved to 4.1 and Mg low at 1.5 - Monitor BMP and replete electrolytes as able   #Hx of iron deficiency anemia Hb stable at 7.9 on admission. Stable Hgb at 8.4 on 8/25, although unable to check since then as patient has declined labs. - Trend CBC - Can consider  iron transfusion in the future - Transfuse if Hb <7   #Opioid use disorder #Polysubstance use  Patient has a history of IV drug use with fentanyl, cocaine, heroine, methamphetamines, with last reported use 2 days prior to admission. Patient does continue to endorse some diaphoresis and nausea, but has had no further episodes of diarrhea. Buprenorphine 2mg  was started on 8/26 and the patient notes that she is unsure if it made her feel any better, but it did not worsen any of her symptoms.  - Increased buprenorphine to 8mg  daily on 8/27 - Continue cyclobenzaprine 5mg  TID for body aches/withdrawal sx, can increase to 10mg  if needed - Continue COWS to monitor for signs of acute withdrawal or intoxication - Nicotine patch    Best Practice: Diet: Regular diet IVF: none VTE: holding xarelto 8/26-8/29 in setting of angio vac on 8/29 Code: Full AB: Ancef DISPO: Anticipated discharge to Home pending Medical stability.  Signature: , D.O.  Internal Medicine Resident, PGY-1 Internal Medicine Residency  Pager: [phone] 6:24 AM, 11/25/2020   Please contact the on call pager after 5 pm and on weekends at [phone].

## 2020-11-25 NOTE — Progress Notes (Signed)
Pt insisted going home AMA.  Refused any care since morning. Took tele monitor off and refused to put back on.  Dr. Ned Card made aware.  Hinton Dyer, RN

## 2020-11-26 ENCOUNTER — Encounter (HOSPITAL_COMMUNITY): Payer: Self-pay | Admitting: Anesthesiology

## 2020-11-26 ENCOUNTER — Encounter (HOSPITAL_COMMUNITY): Payer: Self-pay | Admitting: Thoracic Surgery (Cardiothoracic Vascular Surgery)

## 2020-11-26 NOTE — Progress Notes (Addendum)
Cy Blamer, RN called to speak to patient. Patient's boyfriend stated she was sleeping. He stated patient did not received any discharge instructions when leaving the hospital on 11/25/20 and the office had not called the patient.   Notified Ilsa Iha that patient left yesterday AMA from [facility]. Pt. Is on OR surgical schedule for 11/27/20 for 0730. No orders in epic. He stated for patient to stop xarelto.    Pre-op instructions given to pt. Over the phone. To arrive at [facility] 0530 on 11/27/20. Npo at midnight. Pt. Stated she did not get the keflex or xarelto filled. Instructed to shower with an dial soap, brush her teeth in the am. No smoking. Pt. Denied using any drugs.

## 2020-11-27 ENCOUNTER — Encounter (HOSPITAL_COMMUNITY)
Admission: RE | Disposition: A | Discharge status: Home / Self Care | Payer: Self-pay | Source: Ambulatory Visit | Attending: Thoracic Surgery (Cardiothoracic Vascular Surgery)

## 2020-11-27 ENCOUNTER — Ambulatory Visit (HOSPITAL_COMMUNITY)
Admission: RE | Admit: 2020-11-27 | Discharge: 2020-11-27 | Disposition: A | Discharge status: Home / Self Care | Payer: Self-pay | Source: Ambulatory Visit | Attending: Thoracic Surgery (Cardiothoracic Vascular Surgery) | Admitting: Thoracic Surgery (Cardiothoracic Vascular Surgery)

## 2020-11-27 HISTORY — DX: Inflammatory liver disease, unspecified: K75.9

## 2020-11-27 SURGERY — APPLICATION OF ANGIOVAC
Anesthesia: General

## 2020-11-27 MED ORDER — CHLORHEXIDINE GLUCONATE 0.12 % MT SOLN
OROMUCOSAL | Status: AC
  Filled 2020-11-27: qty 15

## 2020-11-27 MED ORDER — FENTANYL CITRATE (PF) 250 MCG/5ML IJ SOLN
INTRAMUSCULAR | Status: AC
  Filled 2020-11-27: qty 5

## 2020-11-27 MED ORDER — MIDAZOLAM HCL 2 MG/2ML IJ SOLN
INTRAMUSCULAR | Status: AC
  Filled 2020-11-27: qty 2

## 2020-11-27 MED ORDER — PROPOFOL 10 MG/ML IV BOLUS
INTRAVENOUS | Status: AC
  Filled 2020-11-27: qty 20

## 2020-11-27 NOTE — Progress Notes (Addendum)
Nurse called Dr. Cliffton Asters to request orders for today's scheduled procedure.  Dr. Cliffton Asters stated that the patient left AMA on Saturday and that the case for today is canceled.   Patient notified and became very upset and began cursing at nurse.  Patient instructed to go to emergency department if symptoms worsened.    Patient escorted to waiting area by nurse.

## 2020-11-28 LAB — CULTURE, BLOOD (ROUTINE X 2)
Culture: NO GROWTH
Culture: NO GROWTH
Special Requests: ADEQUATE

## 2020-11-30 ENCOUNTER — Other Ambulatory Visit: Payer: Self-pay

## 2020-11-30 ENCOUNTER — Emergency Department (HOSPITAL_COMMUNITY): Payer: Medicaid Other

## 2020-11-30 ENCOUNTER — Inpatient Hospital Stay (HOSPITAL_COMMUNITY)
Admission: EM | Admit: 2020-11-30 | Discharge: 2020-12-01 | DRG: 288 | Discharge status: Against Medical Advice | Payer: Medicaid Other | Attending: Internal Medicine | Admitting: Internal Medicine

## 2020-11-30 ENCOUNTER — Encounter (HOSPITAL_COMMUNITY): Payer: Self-pay | Admitting: *Deleted

## 2020-11-30 DIAGNOSIS — R7881 Bacteremia: Secondary | ICD-10-CM | POA: Diagnosis present

## 2020-11-30 DIAGNOSIS — F17210 Nicotine dependence, cigarettes, uncomplicated: Secondary | ICD-10-CM | POA: Diagnosis present

## 2020-11-30 DIAGNOSIS — M791 Myalgia, unspecified site: Secondary | ICD-10-CM | POA: Diagnosis present

## 2020-11-30 DIAGNOSIS — Z86711 Personal history of pulmonary embolism: Secondary | ICD-10-CM

## 2020-11-30 DIAGNOSIS — F1120 Opioid dependence, uncomplicated: Secondary | ICD-10-CM | POA: Diagnosis present

## 2020-11-30 DIAGNOSIS — D649 Anemia, unspecified: Secondary | ICD-10-CM | POA: Diagnosis present

## 2020-11-30 DIAGNOSIS — B9561 Methicillin susceptible Staphylococcus aureus infection as the cause of diseases classified elsewhere: Secondary | ICD-10-CM | POA: Diagnosis present

## 2020-11-30 DIAGNOSIS — Z825 Family history of asthma and other chronic lower respiratory diseases: Secondary | ICD-10-CM

## 2020-11-30 DIAGNOSIS — J45909 Unspecified asthma, uncomplicated: Secondary | ICD-10-CM | POA: Diagnosis present

## 2020-11-30 DIAGNOSIS — I4891 Unspecified atrial fibrillation: Secondary | ICD-10-CM | POA: Diagnosis present

## 2020-11-30 DIAGNOSIS — Z8249 Family history of ischemic heart disease and other diseases of the circulatory system: Secondary | ICD-10-CM | POA: Diagnosis not present

## 2020-11-30 DIAGNOSIS — Z5329 Procedure and treatment not carried out because of patient's decision for other reasons: Secondary | ICD-10-CM | POA: Diagnosis present

## 2020-11-30 DIAGNOSIS — E875 Hyperkalemia: Secondary | ICD-10-CM | POA: Diagnosis present

## 2020-11-30 DIAGNOSIS — I2690 Septic pulmonary embolism without acute cor pulmonale: Secondary | ICD-10-CM | POA: Diagnosis present

## 2020-11-30 DIAGNOSIS — Z8619 Personal history of other infectious and parasitic diseases: Secondary | ICD-10-CM | POA: Diagnosis not present

## 2020-11-30 DIAGNOSIS — I330 Acute and subacute infective endocarditis: Secondary | ICD-10-CM | POA: Diagnosis present

## 2020-11-30 DIAGNOSIS — I071 Rheumatic tricuspid insufficiency: Secondary | ICD-10-CM | POA: Diagnosis present

## 2020-11-30 DIAGNOSIS — Z809 Family history of malignant neoplasm, unspecified: Secondary | ICD-10-CM

## 2020-11-30 LAB — I-STAT BETA HCG BLOOD, ED (MC, WL, AP ONLY): I-stat hCG, quantitative: <5 m[IU]/mL (ref <5)

## 2020-11-30 LAB — RAPID URINE DRUG SCREEN, HOSP PERFORMED
Amphetamines: NOT DETECTED
Barbiturates: NOT DETECTED
Benzodiazepines: NOT DETECTED
Cocaine: POSITIVE — AB
Opiates: POSITIVE — AB
Tetrahydrocannabinol: POSITIVE — AB

## 2020-11-30 LAB — CBC WITH DIFFERENTIAL/PLATELET
Abs Immature Granulocytes: 0.11 10*3/uL — ABNORMAL HIGH (ref 0.00–0.07)
Basophils Absolute: 0.1 10*3/uL (ref 0.0–0.1)
Basophils Relative: 1 %
Eosinophils Absolute: 0.1 10*3/uL (ref 0.0–0.5)
Eosinophils Relative: 1 %
HCT: 28.5 % — ABNORMAL LOW (ref 36.0–46.0)
Hemoglobin: 8.6 g/dL — ABNORMAL LOW (ref 12.0–15.0)
Immature Granulocytes: 1 %
Lymphocytes Relative: 17 %
Lymphs Abs: 2.7 10*3/uL (ref 0.7–4.0)
MCH: 25.7 pg — ABNORMAL LOW (ref 26.0–34.0)
MCHC: 30.2 g/dL (ref 30.0–36.0)
MCV: 85.1 fL (ref 80.0–100.0)
Monocytes Absolute: 0.8 10*3/uL (ref 0.1–1.0)
Monocytes Relative: 5 %
Neutro Abs: 12.4 10*3/uL — ABNORMAL HIGH (ref 1.7–7.7)
Neutrophils Relative %: 75 %
Platelets: 527 10*3/uL — ABNORMAL HIGH (ref 150–400)
RBC: 3.35 MIL/uL — ABNORMAL LOW (ref 3.87–5.11)
RDW: 17.2 % — ABNORMAL HIGH (ref 11.5–15.5)
WBC: 16.1 10*3/uL — ABNORMAL HIGH (ref 4.0–10.5)
nRBC: 0 % (ref 0.0–0.2)

## 2020-11-30 LAB — COMPREHENSIVE METABOLIC PANEL
ALT: 14 U/L (ref 0–44)
AST: 25 U/L (ref 15–41)
Albumin: 2.6 g/dL — ABNORMAL LOW (ref 3.5–5.0)
Alkaline Phosphatase: 84 U/L (ref 38–126)
Anion gap: 12 (ref 5–15)
BUN: 5 mg/dL — ABNORMAL LOW (ref 6–20)
CO2: 24 mmol/L (ref 22–32)
Calcium: 9.1 mg/dL (ref 8.9–10.3)
Chloride: 99 mmol/L (ref 98–111)
Creatinine, Ser: 0.55 mg/dL (ref 0.44–1.00)
GFR, Estimated: >60 mL/min (ref >60)
Glucose, Bld: 96 mg/dL (ref 70–99)
Potassium: 5.3 mmol/L — ABNORMAL HIGH (ref 3.5–5.1)
Sodium: 135 mmol/L (ref 135–145)
Total Bilirubin: 0.3 mg/dL (ref 0.3–1.2)
Total Protein: 7.6 g/dL (ref 6.5–8.1)

## 2020-11-30 LAB — TROPONIN I (HIGH SENSITIVITY)
Troponin I (High Sensitivity): 4 ng/L (ref <18)
Troponin I (High Sensitivity): 34 ng/L — ABNORMAL HIGH (ref <18)
Troponin I (High Sensitivity): 3 ng/L (ref <18)
Troponin I (High Sensitivity): 2 ng/L (ref <18)

## 2020-11-30 LAB — LACTIC ACID, PLASMA
Lactic Acid, Venous: 3.1 mmol/L (ref 0.5–1.9)
Lactic Acid, Venous: 1.2 mmol/L (ref 0.5–1.9)

## 2020-11-30 LAB — URINALYSIS, ROUTINE W REFLEX MICROSCOPIC
Bilirubin Urine: NEGATIVE
Glucose, UA: NEGATIVE mg/dL
Hgb urine dipstick: NEGATIVE
Ketones, ur: NEGATIVE mg/dL
Nitrite: NEGATIVE
Protein, ur: NEGATIVE mg/dL
Specific Gravity, Urine: 1.017 (ref 1.005–1.030)
pH: 5 (ref 5.0–8.0)

## 2020-11-30 MED ORDER — CYCLOBENZAPRINE HCL 10 MG PO TABS
5.0000 mg | ORAL_TABLET | Freq: Three times a day (TID) | ORAL | Status: DC
  Administered 2020-11-30 – 2020-12-01 (×3): 5 mg via ORAL
  Filled 2020-11-30 (×4): qty 1

## 2020-11-30 MED ORDER — ACETAMINOPHEN 325 MG PO TABS: 650.0000 mg | ORAL_TABLET | Freq: Four times a day (QID) | ORAL | Status: DC | PRN

## 2020-11-30 MED ORDER — NICOTINE 14 MG/24HR TD PT24
14.0000 mg | MEDICATED_PATCH | Freq: Every day | TRANSDERMAL | Status: DC
  Filled 2020-11-30 (×2): qty 1

## 2020-11-30 MED ORDER — CEFAZOLIN SODIUM-DEXTROSE 2-4 GM/100ML-% IV SOLN
2.0000 g | Freq: Three times a day (TID) | INTRAVENOUS | Status: DC
  Administered 2020-11-30 – 2020-12-01 (×4): 2 g via INTRAVENOUS
  Filled 2020-11-30 (×6): qty 100

## 2020-11-30 MED ORDER — ACETAMINOPHEN 650 MG RE SUPP: 650.0000 mg | Freq: Four times a day (QID) | RECTAL | Status: DC | PRN

## 2020-11-30 MED ORDER — ONDANSETRON HCL 4 MG PO TABS: 4.0000 mg | ORAL_TABLET | Freq: Four times a day (QID) | ORAL | Status: DC | PRN

## 2020-11-30 MED ORDER — HYDROMORPHONE HCL 2 MG PO TABS: 2.0000 mg | ORAL_TABLET | ORAL | Status: DC | PRN

## 2020-11-30 MED ORDER — KETOROLAC TROMETHAMINE 30 MG/ML IJ SOLN
30.0000 mg | Freq: Three times a day (TID) | INTRAMUSCULAR | Status: DC
  Administered 2020-11-30 – 2020-12-01 (×3): 30 mg via INTRAVENOUS
  Filled 2020-11-30 (×4): qty 1

## 2020-11-30 MED ORDER — HYDROMORPHONE HCL 1 MG/ML IJ SOLN
1.0000 mg | INTRAMUSCULAR | Status: DC | PRN
Start: 2020-11-30 — End: 2020-12-01

## 2020-11-30 MED ORDER — ONDANSETRON HCL 4 MG/2ML IJ SOLN: 4.0000 mg | Freq: Four times a day (QID) | INTRAMUSCULAR | Status: DC | PRN

## 2020-11-30 MED ORDER — POLYETHYLENE GLYCOL 3350 17 G PO PACK: 17.0000 g | PACK | Freq: Every day | ORAL | Status: DC | PRN

## 2020-11-30 MED ORDER — RIVAROXABAN 15 MG PO TABS
15.0000 mg | ORAL_TABLET | Freq: Two times a day (BID) | ORAL | Status: DC
  Administered 2020-11-30 – 2020-12-01 (×3): 15 mg via ORAL
  Filled 2020-11-30 (×4): qty 1

## 2020-11-30 MED ORDER — SODIUM CHLORIDE 0.9 % IV BOLUS
1000.0000 mL | Freq: Once | INTRAVENOUS | Status: AC
  Administered 2020-12-01: 1000 mL via INTRAVENOUS

## 2020-11-30 MED ORDER — NALOXONE HCL 0.4 MG/ML IJ SOLN: 0.4000 mg | INTRAMUSCULAR | Status: DC | PRN

## 2020-11-30 NOTE — ED Provider Notes (Signed)
[facility] EMERGENCY DEPARTMENT Provider Note   CSN: [id number] Arrival date & time: 11/30/20  0901     History Chief Complaint  Patient presents with   Chest Pain   Shortness of Breath    Kathryn Medina is a 30 y.o. female with pertinent PMH of polysubstance use/IV drug use, MSSA infective endocarditis of TV with multiple recent admissions for bacteremia with hx of leaving AMA, degenerative disc disease with lumbar radiculopathy, hepatitis C, who presents to [facility] with shortness of breath, pleuritic chest pain, and chills. These symptoms have been ongoing since leaving [facility] 8/27. She mentions that the chills "come and go" and that she feels warm all over. She becomes short of breath during conversation and c/o pain while taking deep breaths. She complains of pain in bilateral posterior ribs with deep breathing. There is focal tenderness to palpation over her lumbar spine. She remains normotensive, afebrile, but tachycardic up to 170s. She denied MRI of lumbar spine during previous admission, but is agreeable today.    Pt reports that she left AMA 8/27 after last admission due to poor emotional support. Her last drug use was cocaine on 8/28, but denies using IV drugs in the interim period, but states "fentanyl might come back positive on a drug test because I used to use a lot before coming here last time".  She mentions that she is eager to follow through with the course of treatment this time while admitted.   [facility]TS consulted for admission, who will admit her to their service.    Chest Pain Associated symptoms: shortness of breath   Shortness of Breath Associated symptoms: chest pain       Past Medical History:  Diagnosis Date   Asthma    does not use inhaler   Hepatitis     Patient Active Problem List   Diagnosis Date Noted   MSSA bacteremia 11/23/2020   Septic pulmonary embolism (HCC) 11/23/2020   Infective endocarditis 11/21/2020   Acute infective  endocarditis 11/19/2020   COVID-19    Hypokalemia    Polysubstance abuse (HCC)    Sepsis (HCC)    Opioid dependence with opioid-induced mood disorder (HCC)    Alcohol-induced mood disorder (HCC)    Non-reassuring fetal heart tones complicating pregnancy, antepartum 08/27/2016   Back pain affecting pregnancy 04/16/2016   Drug abuse during pregnancy (HCC) 03/19/2016   Supervision of normal first pregnancy 01/23/2016    Past Surgical History:  Procedure Laterality Date   DILATION AND EVACUATION  03/22/2012   Procedure: DILATATION AND EVACUATION;  Surgeon: Brock Bad, MD;  Location: [facility] ORS;  Service: Gynecology;  Laterality: N/A;   TEE WITHOUT CARDIOVERSION N/A 11/24/2020   Procedure: TRANSESOPHAGEAL ECHOCARDIOGRAM (TEE);  Surgeon: Thurmon Fair, MD;  Location: [facility] ENDOSCOPY;  Service: Cardiovascular;  Laterality: N/A;     OB History     Gravida  2   Para  1   Term  1   Preterm  0   AB  1   Living  1      SAB  1   IAB  0   Ectopic  0   Multiple  0   Live Births  1           Family History  Problem Relation Age of Onset   Cancer Paternal Grandfather    Cancer Paternal Grandmother    Emphysema Maternal Grandmother    Hypertension Father    Cancer Father     Social  History   Tobacco Use   Smoking status: Every Day    Packs/day: 0.50    Years: 10.00    Pack years: 5.00    Types: Cigarettes   Smokeless tobacco: Never   Tobacco comments:    pt is trying to use the electronic cigarette  Vaping Use   Vaping Use: Never used  Substance Use Topics   Alcohol use: Yes    Comment: none in a long time  1 month   Drug use: Yes    Types: Oxycodone, Marijuana, Heroin, Cocaine, Amphetamines, Fentanyl    Comment: pt. denies    Home Medications Prior to Admission medications   Medication Sig Start Date End Date Taking? Authorizing Provider  acetaminophen (TYLENOL) 500 MG tablet Take 1,000 mg by mouth every 6 (six) hours as needed for mild pain.   Yes  [provider]  Aspirin-Salicylamide-Caffeine (BC FAST PAIN RELIEF) 650-195-33.3 MG PACK Take 1 Package by mouth daily as needed (pain).   Yes [provider]  cephALEXin (KEFLEX) 500 MG capsule Take 1 capsule (500 mg total) by mouth 4 (four) times daily. Patient not taking: No sig reported 11/25/20 01/24/21  Atway, Derwood Kaplan, DO  RIVAROXABAN (XARELTO) VTE STARTER PACK (15 & 20 MG) Follow package directions: Take one 15mg  tablet by mouth twice a day. On day 22, switch to one 20mg  tablet once a day. Take with food. Patient not taking: No sig reported 11/25/20   Atway, Rayann N, DO    Allergies    Other  Review of Systems   Negative except as above  Physical Exam Updated Vital Signs BP (!) 115/93   Pulse (!) 172   Temp 99.4 F (37.4 C) (Oral)   Resp (!) 25   LMP 11/16/2020   SpO2 94%   Physical Exam Constitutional: appears intoxicated, appears older than stated age,  HENT: Normocephalic and atraumatic, EOMI, conjunctiva normal, moist mucous membranes Cardiovascular: Tachycardic, regular rhythm, S1 and S2 present, no murmurs, rubs, gallops.  Distal pulses intact Respiratory: tachypneic. Lungs are clear to auscultation bilaterally. On room air GI: Nondistended, soft, nontender to palpation, normal bowel sounds Musculoskeletal: Normal bulk and tone.  No peripheral edema noted. osler nodes not noted on exam.  Neurological: Is alert and oriented x4, no apparent focal deficits noted. Skin: Warm and dry. No rash, erythema, lesions noted.  Psychiatric: restless, answering questions appropriately   ED Results / Procedures / Treatments   Labs (all labs ordered are listed, but only abnormal results are displayed) Labs Reviewed  CBC WITH DIFFERENTIAL/PLATELET - Abnormal; Notable for the following components:      Result Value   WBC 16.1 (*)    RBC 3.35 (*)    Hemoglobin 8.6 (*)    HCT 28.5 (*)    MCH 25.7 (*)    RDW 17.2 (*)    Platelets 527 (*)    Neutro Abs 12.4  (*)    Abs Immature Granulocytes 0.11 (*)    All other components within normal limits  LACTIC ACID, PLASMA - Abnormal; Notable for the following components:   Lactic Acid, Venous 3.1 (*)    All other components within normal limits  COMPREHENSIVE METABOLIC PANEL  LACTIC ACID, PLASMA  URINALYSIS, ROUTINE W REFLEX MICROSCOPIC  RAPID URINE DRUG SCREEN, HOSP PERFORMED  I-STAT BETA HCG BLOOD, ED ([facility], [facility], [facility] ONLY)  TROPONIN I (HIGH SENSITIVITY)  TROPONIN I (HIGH SENSITIVITY)    EKG None  Radiology DG Chest 2 View  Result Date: 11/30/2020  CLINICAL DATA:  Chest pain EXAM: CHEST - 2 VIEW COMPARISON:  11/21/2020 FINDINGS: Infiltrates on the right more than left with mild improvement at the right base when compared to prior. Similar degree of elevated right diaphragm likely accentuated by spinal curvature. No visible effusion or air leak. Stable heart size and mediastinal contours. IMPRESSION: Multifocal pneumonia with mild improvement from 11/21/2020. No pleural fluid or air leak seen. Electronically Signed   By: Marnee Spring M.D.   On: 11/30/2020 10:03    Procedures Procedures   Medications Ordered in ED Medications - No data to display  ED Course  I have reviewed the triage vital signs and the nursing notes.  Pertinent labs & imaging results that were available during my care of the patient were reviewed by me and considered in my medical decision making (see chart for details).  Pending MRI of lumbar spine given focal lumbar tenderness  Started Ancef (was started during prior admission) Xarelto 15 bid for PE (was started during prior admission,)     MDM Rules/Calculators/A&P                           Ms. Rann is a 30 yo F presenting with chills, pleuritic chest pain, fatigue, and whole body weakness, coming back to the ED 5 days after leaving AMA during an admission for MSSA bacterial endocarditis of TV and bacteremia. Appropriate therapy and imaging has been started while in  the ED, and the IMTS has been consulted for admission.   Final Clinical Impression(s) / ED Diagnoses Final diagnoses:  None    Rx / DC Orders ED Discharge Orders     None        Carmel Sacramento, MD 11/30/20 1212    Gerhard Munch, MD 11/30/20 1655

## 2020-11-30 NOTE — ED Notes (Signed)
Attempted to call report

## 2020-11-30 NOTE — ED Triage Notes (Signed)
Pt has hx of endocarditis, having chest pain and sob for 18 months. Skin w/d at triage.

## 2020-11-30 NOTE — ED Notes (Signed)
Pt at nurses' desk asking for ice cream or salad. Explained that we do not have access to ice cream or salads in the ED. Explained that pt has been assigned a room and will be going upstairs as soon as possible and that she can order dinner when upstairs. Pt doesn't seem to be able to understand and continues to state that she hasn't eaten today. Pt offered and given and [place] sandwich bag.

## 2020-11-30 NOTE — Progress Notes (Signed)
Pt uncooperative, unable to achieve diagnostic imaging.

## 2020-11-30 NOTE — ED Notes (Signed)
Pt has removed monitoring devices again. Pt ambulated to the bathroom on her own power.

## 2020-11-30 NOTE — H&P (Addendum)
Date: 11/30/2020               Patient Name:  Kathryn Medina MRN: [id number]  DOB: November 16, 1990 Age / Sex: 30 y.o., female   PCP: Pcp, No         Medical Service: Internal Medicine Teaching Service         Attending Physician: Dr. Reymundo Poll, MD    First Contact: Dr. Sharene Butters Pager: [phone]  Second Contact: Dr. Montez Morita Pager: [phone]       After Hours (After 5p/  First Contact Pager: [phone]  weekends / holidays): Second Contact Pager: [phone]   Chief Complaint: SOB, chest pain  History of Present Illness:   Kathryn Medina is a 30 y.o. with a pertinent PMH of polysubstance use and IV drug use, MSSA infective endocarditis of tricuspid valve, DDD with lumbar radiculopathy, and hepatitis C who presented to [facility] ED 9/1 with chest pain and SOB.  Patient had two recent admissions to [facility] for MSSA bacteremia and TV IE with septic pulmonary emboli (8/23, 8/21). Patient leaves AMA after each admission, last AMA discharge on 8/27. Patient states she returned to the ED for the "same thing", her infection. She states she left AMA on 8/27 due to disatisfaction with hospital staff. States she feels fine overall but reports some pleuritic chest pain, SOB, and cough which have persisted since discharge last Saturday. She complains of pain in bilateral lateral ribs with deep breathing. She presented back to the hospital because she knows she can die from her infection and this worries her. She denies fever, chills. She was prescribed PO Keflex and Xarelto at [facility] discharge but reports not aware of any medications prescribed at discharge.   She endorses back pain that is baseline from her DDD and herniated disc. States her L foot cellulitis has resolved. Denies n/v, loose stools, abdominal pain, worsening back pain.   Last used IV drugs on Monday or Tuesday. She used either fentanyl or heroin, as well as cocaine and marijuana. Denies taking oral pain medications. Denies withdrawal symptoms  at this time.   ED Course: Patient afebrile, tachycardic with SBP 110s on arrival. CBC with leukocytosis to 16.1 and normocytic anemia and reactive thrombocytosis. BMP showed hyperkalemia 5.3, normal kidney and liver function tests, low albumin 2.6. LA on arrival 3.1 with repeat LA 1.2. UA with small leukocytes and rare bacteria with 11-20 WBCs. UDS positive for opiates, cocaine, cannabis. Troponins 4 and 34. Patient was started on ancef and medicine consulted for admission.    Meds:  Current Meds  Medication Sig   acetaminophen (TYLENOL) 500 MG tablet Take 1,000 mg by mouth every 6 (six) hours as needed for mild pain.   Aspirin-Salicylamide-Caffeine (BC FAST PAIN RELIEF) 650-195-33.3 MG PACK Take 1 Package by mouth daily as needed (pain).     Allergies: Allergies as of 11/30/2020 - Review Complete 11/30/2020  Allergen Reaction Noted   Other Hives 08/25/2016   Past Medical History:  Diagnosis Date   Asthma    does not use inhaler   Hepatitis     Family History:  Family History  Problem Relation Age of Onset   Cancer Paternal Grandfather    Cancer Paternal Grandmother    Emphysema Maternal Grandmother    Hypertension Father    Cancer Father     Social History:  - Currently lives with her husband and other room mates - Not currently working due to multiple hospitalizations recently  - reports  use of marijuana, cocaine, meth, heroin, fentanyl - last IV drug use reportedly 2 days ago - smokes cigarettes 5 pack years  Review of Systems: A complete ROS was negative except as per HPI.   Physical Exam: Blood pressure 113/64, pulse 77, temperature 99.4 F (37.4 C), temperature source Oral, resp. rate 20, last menstrual period 11/16/2020, SpO2 98 %. Physical Exam: General: chronically ill appearing caucasian female, NAD HENT: normocephalic, atraumatic EYES: conjunctiva non-erythematous, no scleral icterus CV: regular rate, normal rhythm, possible murmur lower left sternal  border, rubs, gallops. Pulmonary: sating well on RA, diffuse rhonchi, upper airway sounds Abdominal: non-distended, soft, non-tender to palpation, normal BS Skin: Warm and dry, no rashes or lesions Neurological: MS: awake, mostly alert and oriented x3, normal speech and fund of knowledge Motor: moves all extremities antigravity Psych: restless, irritable   CBC with Differential [[id number]] (Abnormal) Collected: 11/30/20 0920  Specimen: Blood from Vein Updated: 11/30/20 1006   WBC 16.1 High  K/uL    RBC 3.35 Low  MIL/uL    Hemoglobin 8.6 Low  g/dL    HCT 09.2 Low  %    MCV 85.1 fL    MCH 25.7 Low  pg    MCHC 30.2 g/dL    RDW 33.0 High  %    Platelets 527 High  K/uL    nRBC 0.0 %    Neutrophils Relative % 75 %    Neutro Abs 12.4 High  K/uL    Lymphocytes Relative 17 %    Lymphs Abs 2.7 K/uL    Monocytes Relative 5 %    Monocytes Absolute 0.8 K/uL    Eosinophils Relative 1 %    Eosinophils Absolute 0.1 K/uL    Basophils Relative 1 %    Basophils Absolute 0.1 K/uL    Immature Granulocytes 1 %    Abs Immature Granulocytes 0.11 High  K/uL    Comprehensive metabolic panel [[id number]] (Abnormal) Collected: 11/30/20 0920  Specimen: Blood from Vein Updated: 11/30/20 1100   Sodium 135 mmol/L    Potassium 5.3 High  mmol/L    Chloride 99 mmol/L    CO2 24 mmol/L    Glucose, Bld 96 mg/dL    BUN 5 Low  mg/dL    Creatinine, Ser 5.45 mg/dL    Calcium 9.1 mg/dL    Total Protein 7.6 g/dL    Albumin 2.6 Low  g/dL    AST 25 U/L    ALT 14 U/L    Alkaline Phosphatase 84 U/L    Total Bilirubin 0.3 mg/dL    GFR, Estimated >62 mL/min    Anion gap 12   Lactic acid, plasma [[id number]] (Abnormal) Collected: 11/30/20 0920  Specimen: Blood from Vein Updated: 11/30/20 1013   Lactic Acid, Venous 3.1 High Panic   mmol/L    Lactic acid, plasma [[id number]] Collected: 11/30/20 1117  Specimen: Blood Updated: 11/30/20 1219   Lactic Acid, Venous 1.2 mmol/L    Rapid urine drug screen (hospital  performed) [[id number]] (Abnormal) Collected: 11/30/20 1048  Specimen: Urine Updated: 11/30/20 1152   Opiates POSITIVE Abnormal    Cocaine POSITIVE Abnormal    Benzodiazepines NONE DETECTED   Amphetamines NONE DETECTED   Tetrahydrocannabinol POSITIVE Abnormal    Barbiturates NONE DETECTED   I-Stat Beta hCG blood, ED ([facility], [facility], [facility] only) [[id number]] Collected: 11/30/20 0927  Specimen: Blood Updated: 11/30/20 0937   I-stat hCG, quantitative <5.0 mIU/mL    Comment 3          EKG: personally reviewed  my interpretation is a fib with RVR, though on review of telemetry patient is in sinus tachycardia  CXR: personally reviewed my interpretation is infiltrates R>L could represent multifocal PNA though more likely from septic pulm emboli  Assessment & Plan by Problem: Active Problems:   Infective endocarditis Kathryn Medina is a 30 y.o. with a pertinent PMH of polysubstance use and IV drug use, MSSA infective endocarditis of tricuspid valve with septic pulmonary emboli, DDD with lumbar radiculopathy, and hepatitis C who presents with pleuritic chest pain and SOB and will be admitted MSSA bacteremia, and tricuspid valve endocarditis.  Tricuspid Valve Infective Endocarditis with MSSA bacteremia Septic pulmonary emboli Patient afebrile, tachycardic with SBP 110s. CBC with leukocytosis to 16.1 and normocytic anemia. Patient was started on Ancef for presumed MSSA TV IE. Patient has a history of MSSA bacteremia and tricuspid valve infective endocarditis with septic pulmonary emboli that has not been completely treated due to patient persistently leaving AMA. TEE on 8/26 showed large tricuspid valve vegetation, severe TR with LVEF of 60%-65%. CT surgery was previously consulted at last admission and angiovac procedure had been scheduled but was cancelled due to patient leaving AMA. Patient refused to go home on IV Linezolid and Oritavancin, was prescribed Keflex 500mg  QID for 2 months per ID recs as well as  Xarelto however patient never picked up these medications. Patient has also refused MRI lumbar spine to evaluate for epinal infection and continues to refuse MRI lumbar spine in the ED.   -Cefazolin 2g q8hr -MRI lumbar spine if patient will tolerate  -Consult to CT surgery to reassess patient's eligibility for angiovac procedure given recent AMA discharge and recent IVDU. -Xarelto 15mg  BID -Tylenol PRN for fever  Chest Pain Afib with RVR Likely pleuritic chest pain secondary to septic pulmonary emboli seen on CTA last admission vs multifocal PNA on CXR vs cardiac chest pain. Troponins 4 and 34 in the ED will trend until peak. A fib with RVR seen on EKG on arrival, sinus tachycardia on telemetry. Recent cocaine use, known infection could cause development of a fib.   -f/u troponins until peak -On Xarelto 15mg  BID for septic pulm emboli -f/u telemetry in the AM  Hyperkalemia On arrival potassium 5.3 without signs of severe hyperkalemia on EKG. Patient was receiving treatment for hypokalemia last week during recent admission, now hyperkalemic may have overcorrected. -1L NS bolus  Opioid use disorder Polysubstance use  Patient has a history of IV drug use with fentanyl, cocaine, heroine, methamphetamines. Last reported use 8/29-30. UDS positive for opiates, cocaine, cannabis. Patient not in acute withdrawal at this time. Plan to hold off on restarting buprenorphine until after decision is made about moving forward   -TOC consult -COWS monitoring -Dilaudid 2mg  PO q4hr PRN for mod pain -Dilaudid 1mg  IV q2hr PRN for severe pain -Flexeril 5mg  TID for body aches -Toradol 30mg  IV TID -Narcan PRN -Zofran PRN -Miralax PRN -Nicotine patch  Normocytic Anemia Hx IDA Patient's Hgb has been stable since last discharge one week prior ~8. Baseline over the last year 7-9. Last iron profile 8/9 care everywhere with iron 11, ferritin 59, % sat 4. -continue to monitor    Diet: Regular  VTE: Xarelto  therapeutic dose IVF: no maintenance fluids Code: Full  Dispo: Admit patient to Inpatient with expected length of stay greater than 2 midnights.  Signed: , MD 11/30/2020, 4:49 PM  Pager After 5pm on weekdays and 1pm on weekends: On Call pager: [phone]

## 2020-11-30 NOTE — ED Provider Notes (Signed)
Emergency Medicine Provider Triage Evaluation Note  Kathryn Medina , Medina 30 y.o. female  was evaluated in triage.  Pt complains of cp, myalgias. Recently left AMA. Known bacteremia and endocarditis. Still using IVD since leaving AMA. Pain to chest on right, worse with deep breathing. NO fever, emesis,  Review of Systems  Positive: CP, SOB Negative: Fever, emesis, abd pain  Physical Exam  BP (!) 144/82   Pulse (!) 115   Temp 99.4 F (37.4 C) (Oral)   Resp 20   LMP 11/16/2020   SpO2 91%  Gen:   Awake, no distress   Resp:  Normal effort  MSK:   Moves extremities without difficulty  Psych:  Twitching, appear under influence of substances Other:    Medical Decision Making  Medically screening exam initiated at 9:17 AM.  Appropriate orders placed.  Kathryn Medina was informed that the remainder of the evaluation will be completed by another provider, this initial triage assessment does not replace that evaluation, and the importance of remaining in the ED until their evaluation is complete.  Known bacteremia and endocarditis. Recently left AMA. Still using IVD after leaving hospital  Labs and imaging ordered   Kathryn Bonifas A, PA-C 11/30/20 0919    Gerhard Munch, MD 11/30/20 1655

## 2020-12-01 ENCOUNTER — Other Ambulatory Visit (HOSPITAL_COMMUNITY): Payer: Self-pay

## 2020-12-01 ENCOUNTER — Other Ambulatory Visit: Payer: Self-pay | Admitting: Student

## 2020-12-01 DIAGNOSIS — I330 Acute and subacute infective endocarditis: Principal | ICD-10-CM

## 2020-12-01 DIAGNOSIS — F1120 Opioid dependence, uncomplicated: Secondary | ICD-10-CM

## 2020-12-01 LAB — IRON AND TIBC
Iron: 12 ug/dL — ABNORMAL LOW (ref 28–170)
Saturation Ratios: 4 % — ABNORMAL LOW (ref 10.4–31.8)
TIBC: 286 ug/dL (ref 250–450)
UIBC: 274 ug/dL

## 2020-12-01 LAB — BASIC METABOLIC PANEL
Anion gap: 7 (ref 5–15)
BUN: 11 mg/dL (ref 6–20)
CO2: 28 mmol/L (ref 22–32)
Calcium: 8.5 mg/dL — ABNORMAL LOW (ref 8.9–10.3)
Chloride: 98 mmol/L (ref 98–111)
Creatinine, Ser: 0.63 mg/dL (ref 0.44–1.00)
GFR, Estimated: >60 mL/min (ref >60)
Glucose, Bld: 95 mg/dL (ref 70–99)
Potassium: 4 mmol/L (ref 3.5–5.1)
Sodium: 133 mmol/L — ABNORMAL LOW (ref 135–145)

## 2020-12-01 LAB — CBC
HCT: 23 % — ABNORMAL LOW (ref 36.0–46.0)
Hemoglobin: 7.1 g/dL — ABNORMAL LOW (ref 12.0–15.0)
MCH: 25.4 pg — ABNORMAL LOW (ref 26.0–34.0)
MCHC: 30.9 g/dL (ref 30.0–36.0)
MCV: 82.1 fL (ref 80.0–100.0)
Platelets: 351 10*3/uL (ref 150–400)
RBC: 2.8 MIL/uL — ABNORMAL LOW (ref 3.87–5.11)
RDW: 17.1 % — ABNORMAL HIGH (ref 11.5–15.5)
WBC: 7.8 10*3/uL (ref 4.0–10.5)
nRBC: 0 % (ref 0.0–0.2)

## 2020-12-01 LAB — FERRITIN: Ferritin: 61 ng/mL (ref 11–307)

## 2020-12-01 MED ORDER — RIVAROXABAN 20 MG PO TABS: 20.0000 mg | ORAL_TABLET | Freq: Every day | ORAL | Status: DC

## 2020-12-01 MED ORDER — RIVAROXABAN (XARELTO) VTE STARTER PACK (15 & 20 MG)
ORAL_TABLET | ORAL | 0 refills | Status: AC
  Filled 2020-12-01: qty 51, 30d supply, fill #0

## 2020-12-01 MED ORDER — POLYSACCHARIDE IRON COMPLEX 150 MG PO CAPS
150.0000 mg | ORAL_CAPSULE | Freq: Every day | ORAL | Status: DC
  Administered 2020-12-01: 150 mg via ORAL
  Filled 2020-12-01: qty 1

## 2020-12-01 MED ORDER — CEPHALEXIN 500 MG PO CAPS
500.0000 mg | ORAL_CAPSULE | Freq: Four times a day (QID) | ORAL | 0 refills | Status: AC
  Filled 2020-12-01: qty 120, 30d supply, fill #0

## 2020-12-01 NOTE — Progress Notes (Signed)
Patient had recent echo 11/24/20.  Please contact echo department if repeat echo is necessary.

## 2020-12-01 NOTE — Discharge Instructions (Signed)
Information on my medicine - XARELTO (rivaroxaban)  This medication education was reviewed with me or my healthcare representative as part of my discharge preparation.   WHY WAS XARELTO PRESCRIBED FOR YOU? Xarelto was prescribed to treat blood clots that may have been found in the veins of your legs (deep vein thrombosis) or in your lungs (pulmonary embolism) and to reduce the risk of them occurring again.  What do you need to know about Xarelto? The starting dose is one 15 mg tablet taken TWICE daily with food for the FIRST 21 DAYS then on 12/21/2020  the dose is changed to one 20 mg tablet taken ONCE A DAY with your evening meal.  DO NOT stop taking Xarelto without talking to the health care provider who prescribed the medication.  Refill your prescription for 20 mg tablets before you run out.  After discharge, you should have regular check-up appointments with your healthcare provider that is prescribing your Xarelto.  In the future your dose may need to be changed if your kidney function changes by a significant amount.  What do you do if you miss a dose? If you are taking Xarelto TWICE DAILY and you miss a dose, take it as soon as you remember. You may take two 15 mg tablets (total 30 mg) at the same time then resume your regularly scheduled 15 mg twice daily the next day.  If you are taking Xarelto ONCE DAILY and you miss a dose, take it as soon as you remember on the same day then continue your regularly scheduled once daily regimen the next day. Do not take two doses of Xarelto at the same time.   Important Safety Information Xarelto is a blood thinner medicine that can cause bleeding. You should call your healthcare provider right away if you experience any of the following: Bleeding from an injury or your nose that does not stop. Unusual colored urine (red or dark brown) or unusual colored stools (red or black). Unusual bruising for unknown reasons. A serious fall or if you  hit your head (even if there is no bleeding).  Some medicines may interact with Xarelto and might increase your risk of bleeding while on Xarelto. To help avoid this, consult your healthcare provider or pharmacist prior to using any new prescription or non-prescription medications, including herbals, vitamins, non-steroidal anti-inflammatory drugs (NSAIDs) and supplements.  This website has more information on Xarelto: [email or web address].br.

## 2020-12-01 NOTE — Progress Notes (Signed)
UPDATE:  While signing out to night team, it was noticed that Kathryn Medina was no longer on IMTS inpatient list. Neither junior or senior IMTS on-call pager was notified that Kathryn Medina was considering leaving AMA nor that she had already left. Called 4E this evening, RN notified me that she spoke to an unnamed female MD regarding this. As to my knowledge, no IMTS physician spoke to Kathryn Medina regarding this this evening. Dr. Antony Contras, attending physician, has been notified.  Evlyn Kanner, MD Internal Medicine PGY-2 [phone]

## 2020-12-01 NOTE — Progress Notes (Signed)
CCMD called to let me know pt off monitor at 1449. Pt not in room. Pt had disconnected IV and left hanging on pole. Telemetry box left on bed and backpack at bedside. Called CN and found patient returning to floor shortly afterwards. Pt aware that she has no discharge order and she can not take telemetry off and leave unit. Pt appears to have taken something to change her attitude from earlier when she was adamant about smoking and leaving. Pt is currently trying to find a ride. Doctor says she just wants to sleep.  Will reapply telemetry and call CCMD.

## 2020-12-01 NOTE — Progress Notes (Addendum)
Subjective:  Overnight: No acute events or concerns overnight  Patient seen and examined on rounds this morning. She reports improvement in pleuritic chest pain and SOB. She continues to deny withdrawal symptoms. She denies back pain.   Patient wanted to go outside to smoke and threatening to leave AMA. Keflex and Xeralto ordered from [facility] in event that patient needs to leave AMA. Per nursing, it is likely that patient used unspecified substance while briefly gone from room given change in attitude.   Patient awaiting call back from her mother to see if she can pick her up this evening. Patient understands needs to take her 2 meds if she leaves AMA. She will let nursing know if mom will come pick her. She knows she has PRNs available if she needs them rather than using her in the hospital. Nursing will have security check her bags.  Objective:  Vital signs in last 24 hours: Vitals:   11/30/20 2354 12/01/20 0000 12/01/20 0402 12/01/20 1204  BP: (!) 95/54  (!) 92/47 126/77  Pulse: 67 62 65 81  Resp: 12 18 15  (!) 23  Temp: 98.4 F (36.9 C)  98.8 F (37.1 C) 98.8 F (37.1 C)  TempSrc: Oral  Oral Oral  SpO2: 94% 98% 97% 99%  Weight:   58.8 kg   Height:       Physical Exam: General: chronically ill appearing caucasian female, NAD HENT: normocephalic, atraumatic EYES: conjunctiva non-erythematous, no scleral icterus CV: regular rate, normal rhythm, possible murmur lower left sternal border, rubs, gallops. Pulmonary: sating well on RA, diffuse rhonchi, upper airway sounds Abdominal: non-distended, soft, non-tender to palpation, normal BS Skin: Warm and dry, no rashes or lesions Neurological: MS: awake, mostly alert and oriented x3, normal speech and fund of knowledge Motor: moves all extremities antigravity Psych: restless, irritable   Assessment/Plan:  Active Problems:   Infective endocarditis   Severe opioid use disorder (HCC)  Kathryn Medina is a 30 y.o. with a pertinent  PMH of polysubstance use and IV drug use, MSSA infective endocarditis of tricuspid valve with septic pulmonary emboli, DDD with lumbar radiculopathy, and hepatitis C who presents with pleuritic chest pain and SOB and will be admitted MSSA bacteremia, and tricuspid valve endocarditis.   Tricuspid Valve Infective Endocarditis with MSSA bacteremia Septic and thrombotic pulmonary emboli TEE on 8/26 showed large tricuspid valve vegetation, severe TR with LVEF of 60%-65%. Patient continues to refuse MRI lumbar spine. Patient has Keflex and Xeralto from [facility] pharm ready in the room if patient leaves AMA. -Cefazolin 2g q8hr -MRI lumbar spine if patient will tolerate  -Consult to CT surgery to reassess patient's eligibility for angiovac procedure given recent AMA discharge and recent IVDU, appreciate recs -Xarelto 15mg  BID -Tylenol PRN for fever   Chest Pain Afib with RVR, resolved Likely pleuritic chest pain secondary to septic pulmonary emboli seen on CTA last admission vs less likely multifocal PNA on CXR. A fib with RVR seen on EKG on arrival, has since been in sinus tachycardia on telemetry.    -On Xarelto 15mg  BID for septic pulm emboli -f/u telemetry   Hyperkalemia, resolved On arrival potassium 5.3 without signs of severe hyperkalemia on EKG. Patient was receiving treatment for hypokalemia last week during recent admission, now hyperkalemic may have overcorrected. Potassium wnl.  -continue to monitor electrolytes   Opioid use disorder Polysubstance use  Patient has a history of IV drug use with fentanyl, cocaine, heroine, methamphetamines. Last reported use 8/29-30. UDS positive for opiates,  cocaine, cannabis. Patient not in acute withdrawal at this time. Plan to hold off on restarting buprenorphine until after decision is made about moving forward with angiovac. -TOC consult -COWS monitoring -Dilaudid 2mg  PO q4hr PRN for mod pain -Dilaudid 1mg  IV q2hr PRN for severe pain -Flexeril 5mg  TID  for body aches -Toradol 30mg  IV TID -Narcan PRN -Zofran PRN -Miralax PRN -Nicotine patch   Normocytic Anemia Hx IDA Patient's Hgb has been stable since last discharge one week prior ~8. Baseline over the last year 7-9. Last iron profile 8/9 care everywhere with iron 11, ferritin 59, % sat 4. -continue to monitor    Diet: Regular  VTE: Xarelto therapeutic dose IVF: no maintenance fluids Code: Full  Prior to Admission Living Arrangement: Home Anticipated Discharge Location: Home Barriers to Discharge: IV Abx for bacteremia  Dispo: Anticipated discharge in approximately 2-3 day(s).   , MD 12/01/2020, 3:55 PM Pager: [phone] After 5pm on weekdays and 1pm on weekends: On Call pager [phone]

## 2020-12-01 NOTE — Progress Notes (Signed)
Wasted 10 mg flexeril table and 30 mg toradol vial with RN Corrie Mckusick. Pt left prior to receiving.

## 2020-12-01 NOTE — Progress Notes (Signed)
Pt IV removed and patient signed out AMA.

## 2020-12-02 NOTE — Discharge Summary (Addendum)
Patient Left AMA  Name: Kathryn Medina MRN: [id number] DOB: October 25, 1990 30 y.o. PCP: Pcp, No  Date of Admission: 11/30/2020  9:02 AM Date of Discharge: 12/01/2020  6:00 PM Attending Physician: Dr. Reymundo Poll  Discharge Diagnosis: 1. Infective endocarditis  2. Septic pulmonary emboli 3. Chest pain 4. Hyperkalemia 5. Polysubstance use disorder  Discharge Medications: Allergies as of 12/01/2020       Reactions   Other Hives   pork        Medication List     TAKE these medications    cephALEXin 500 MG capsule Commonly known as: KEFLEX Take 1 capsule (500 mg total) by mouth 4 (four) times daily.   Xarelto Starter Pack Generic drug: Rivaroxaban Stater Pack (15 mg and 20 mg) Follow package directions: Take one 15mg  tablet by mouth twice a day. On day 22, switch to one 20mg  tablet once a day. Take with food.       ASK your doctor about these medications    acetaminophen 500 MG tablet Commonly known as: TYLENOL Take 1,000 mg by mouth every 6 (six) hours as needed for mild pain.   BC Fast Pain Relief 650-195-33.3 MG Pack Generic drug: Aspirin-Salicylamide-Caffeine Take 1 Package by mouth daily as needed (pain).        Disposition and follow-up:   [name] left AMA.  1.  Patient has untreated infective endocarditis with a TV vegetation, repeat BC pending. Unfortunately she has left AMA now multiple times without treatment. We were able to provide her with keflex and xarelto prior to her leaving AMA in hope she will have some treatment.   2.  Labs / imaging needed at time of follow-up: none  3.  Pending labs/ test needing follow-up: blood cultures  Follow-up Appointments: None  Hospital Course by problem list: Kathryn Medina is a 30 y.o. 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   Tricuspid Valve Infective Endocarditis with MSSA bacteremia Septic pulmonary emboli Patient afebrile, tachycardic with SBP 110s. CBC with leukocytosis to 16.1 and normocytic anemia. Patient was started on Ancef for presumed MSSA TV IE. Patient has a history of MSSA bacteremia and tricuspid valve infective endocarditis with septic pulmonary emboli that has not been completely treated due to patient persistently leaving AMA. TEE on 8/26 showed large tricuspid valve vegetation, severe TR with LVEF of 60%-65%. CT surgery was previously consulted at last admission and angiovac procedure had been scheduled but was cancelled due to patient leaving AMA. Patient refused to go home on IV Linezolid and Oritavancin, was prescribed Keflex 500mg  QID for 2 months per ID recs as well as Xarelto however patient never picked up these medications. Patient has also refused MRI lumbar spine to evaluate for epinal infection and continues to refuse MRI lumbar spine this admission.  Repeat blood cultures were ordered because patient endorsed continued IV drug use. Unfortunately patient left prior to CT surgery evaluation. She was provided with prescriptions of keflex and xarelto.   Chest Pain Likely pleuritic chest pain secondary to septic pulmonary emboli seen on CTA last admission vs multifocal PNA on CXR vs cardiac chest pain. Troponins 4 and 34 in the ED will trend until peak. A fib with RVR was read on  initial EKG but upon further review it appeared to be an AVRT which resolved. Recent cocaine use vs known infection may have contributed.   Hyperkalemia On arrival potassium 5.3 without signs of severe hyperkalemia on EKG. Patient was receiving treatment for hypokalemia last week during recent admission, now hyperkalemic may have overcorrected.   Opioid use disorder Polysubstance use  Patient has  a history of IV drug use with fentanyl, cocaine, heroine, methamphetamines. Last reported use 8/29-30. UDS positive for opiates, cocaine, cannabis. Patient not in acute withdrawal at this time. Planned to continue treating pain with opioids knowing we may need to start suboxone if she withdrew. Patient did not require any pain medications while admitted, suspected she may have been using illicit substances in the room but we were unable to confirm this.   Normocytic Anemia Hx IDA Patient's Hgb has been stable since last discharge one week prior ~8. Baseline over the last year 7-9. Last iron profile 8/9 care everywhere with iron 11, ferritin 59, % sat 4.  Discharge Exam:   BP 126/77   Pulse 81   Temp 98.8 F (37.1 C) (Oral)   Resp (!) 23   Ht 5\' 6"  (1.676 m)   Wt 58.8 kg   LMP 11/16/2020   SpO2 99%   BMI 20.92 kg/m  Discharge exam: Patient left AMA  Pertinent Labs, Studies, and Procedures:  CBC Latest Ref Rng & Units 12/01/2020 11/30/2020 11/23/2020  WBC 4.0 - 10.5 K/uL 7.8 16.1(H) 11.2(H)  Hemoglobin 12.0 - 15.0 g/dL 7.1(L) 8.6(L) 8.4(L)  Hematocrit 36.0 - 46.0 % 23.0(L) 28.5(L) 25.8(L)  Platelets 150 - 400 K/uL 351 527(H) 390   BMP Latest Ref Rng & Units 12/01/2020 11/30/2020 11/23/2020  Glucose 70 - 99 mg/dL 95 96 11/25/2020)  BUN 6 - 20 mg/dL 11 5(L) 340(B)  Creatinine 0.44 - 1.00 mg/dL <5(C 4.81 8.59  Sodium 135 - 145 mmol/L 133(L) 135 133(L)  Potassium 3.5 - 5.1 mmol/L 4.0 5.3(H) 4.1  Chloride 98 - 111 mmol/L 98 99 105  CO2 22 - 32 mmol/L 28 24 20(L)  Calcium 8.9 - 10.3 mg/dL 0.93) 9.1 1.1(E)   Blood Culture    Component Value Date/Time   SDES BLOOD LEFT ANTECUBITAL 12/01/2020 0757   SPECREQUEST  12/01/2020 0757    BOTTLES DRAWN AEROBIC ONLY Blood Culture adequate volume   CULT  12/01/2020 0757    NO GROWTH 1 DAY Performed at [facility], [address], [place], [place]    REPTSTATUS PENDING 12/01/2020 01/31/2021      Signed: 0722, DO 12/02/2020, 11:41  AM

## 2020-12-06 LAB — CULTURE, BLOOD (ROUTINE X 2)
Culture: NO GROWTH
Culture: NO GROWTH
Special Requests: ADEQUATE
Special Requests: ADEQUATE

## 2020-12-15 ENCOUNTER — Other Ambulatory Visit (HOSPITAL_BASED_OUTPATIENT_CLINIC_OR_DEPARTMENT_OTHER): Payer: Self-pay

## 2020-12-15 ENCOUNTER — Telehealth (HOSPITAL_BASED_OUTPATIENT_CLINIC_OR_DEPARTMENT_OTHER): Payer: Self-pay | Admitting: Pharmacist

## 2020-12-15 NOTE — Telephone Encounter (Signed)
Transitions of Care Pharmacy   Call attempted for a pharmacy transitions of care follow-up. Unable to LVM.  Call attempt #1. Will follow-up in 2-3 days.    Theodis Sato, PharmD Clinical Pharmacist [facility]  12/15/2020 11:13 AM

## 2020-12-29 ENCOUNTER — Emergency Department (HOSPITAL_COMMUNITY): Payer: Medicaid Other

## 2020-12-29 ENCOUNTER — Encounter (HOSPITAL_COMMUNITY): Payer: Self-pay

## 2020-12-29 ENCOUNTER — Other Ambulatory Visit: Payer: Self-pay

## 2020-12-29 ENCOUNTER — Inpatient Hospital Stay (HOSPITAL_COMMUNITY): Payer: Medicaid Other

## 2020-12-29 ENCOUNTER — Inpatient Hospital Stay (HOSPITAL_COMMUNITY)
Admission: EM | Admit: 2020-12-29 | Discharge: 2021-01-30 | DRG: 853 | Disposition: E | Payer: Medicaid Other | Attending: Internal Medicine | Admitting: Internal Medicine

## 2020-12-29 DIAGNOSIS — I6330 Cerebral infarction due to thrombosis of unspecified cerebral artery: Secondary | ICD-10-CM | POA: Insufficient documentation

## 2020-12-29 DIAGNOSIS — E785 Hyperlipidemia, unspecified: Secondary | ICD-10-CM | POA: Diagnosis present

## 2020-12-29 DIAGNOSIS — Z79899 Other long term (current) drug therapy: Secondary | ICD-10-CM

## 2020-12-29 DIAGNOSIS — Z8616 Personal history of COVID-19: Secondary | ICD-10-CM | POA: Diagnosis not present

## 2020-12-29 DIAGNOSIS — Z515 Encounter for palliative care: Secondary | ICD-10-CM

## 2020-12-29 DIAGNOSIS — G936 Cerebral edema: Secondary | ICD-10-CM | POA: Diagnosis not present

## 2020-12-29 DIAGNOSIS — R7881 Bacteremia: Secondary | ICD-10-CM | POA: Diagnosis present

## 2020-12-29 DIAGNOSIS — A4101 Sepsis due to Methicillin susceptible Staphylococcus aureus: Principal | ICD-10-CM | POA: Diagnosis present

## 2020-12-29 DIAGNOSIS — J9601 Acute respiratory failure with hypoxia: Secondary | ICD-10-CM | POA: Diagnosis not present

## 2020-12-29 DIAGNOSIS — E43 Unspecified severe protein-calorie malnutrition: Secondary | ICD-10-CM | POA: Diagnosis present

## 2020-12-29 DIAGNOSIS — J9600 Acute respiratory failure, unspecified whether with hypoxia or hypercapnia: Secondary | ICD-10-CM | POA: Diagnosis not present

## 2020-12-29 DIAGNOSIS — F1120 Opioid dependence, uncomplicated: Secondary | ICD-10-CM | POA: Diagnosis present

## 2020-12-29 DIAGNOSIS — D696 Thrombocytopenia, unspecified: Secondary | ICD-10-CM | POA: Diagnosis not present

## 2020-12-29 DIAGNOSIS — L89151 Pressure ulcer of sacral region, stage 1: Secondary | ICD-10-CM | POA: Clinically undetermined

## 2020-12-29 DIAGNOSIS — Z95828 Presence of other vascular implants and grafts: Secondary | ICD-10-CM | POA: Diagnosis not present

## 2020-12-29 DIAGNOSIS — E871 Hypo-osmolality and hyponatremia: Secondary | ICD-10-CM | POA: Diagnosis present

## 2020-12-29 DIAGNOSIS — I330 Acute and subacute infective endocarditis: Secondary | ICD-10-CM | POA: Diagnosis present

## 2020-12-29 DIAGNOSIS — K559 Vascular disorder of intestine, unspecified: Secondary | ICD-10-CM | POA: Diagnosis present

## 2020-12-29 DIAGNOSIS — Z6821 Body mass index (BMI) 21.0-21.9, adult: Secondary | ICD-10-CM

## 2020-12-29 DIAGNOSIS — E876 Hypokalemia: Secondary | ICD-10-CM | POA: Diagnosis not present

## 2020-12-29 DIAGNOSIS — L8990 Pressure ulcer of unspecified site, unspecified stage: Secondary | ICD-10-CM | POA: Insufficient documentation

## 2020-12-29 DIAGNOSIS — F1123 Opioid dependence with withdrawal: Secondary | ICD-10-CM | POA: Diagnosis not present

## 2020-12-29 DIAGNOSIS — F419 Anxiety disorder, unspecified: Secondary | ICD-10-CM | POA: Diagnosis present

## 2020-12-29 DIAGNOSIS — J189 Pneumonia, unspecified organism: Secondary | ICD-10-CM | POA: Diagnosis not present

## 2020-12-29 DIAGNOSIS — Z7901 Long term (current) use of anticoagulants: Secondary | ICD-10-CM

## 2020-12-29 DIAGNOSIS — R0603 Acute respiratory distress: Secondary | ICD-10-CM

## 2020-12-29 DIAGNOSIS — F1193 Opioid use, unspecified with withdrawal: Secondary | ICD-10-CM | POA: Diagnosis present

## 2020-12-29 DIAGNOSIS — D6489 Other specified anemias: Secondary | ICD-10-CM | POA: Diagnosis present

## 2020-12-29 DIAGNOSIS — I361 Nonrheumatic tricuspid (valve) insufficiency: Secondary | ICD-10-CM | POA: Diagnosis not present

## 2020-12-29 DIAGNOSIS — Z4659 Encounter for fitting and adjustment of other gastrointestinal appliance and device: Secondary | ICD-10-CM

## 2020-12-29 DIAGNOSIS — I083 Combined rheumatic disorders of mitral, aortic and tricuspid valves: Secondary | ICD-10-CM | POA: Diagnosis present

## 2020-12-29 DIAGNOSIS — I63512 Cerebral infarction due to unspecified occlusion or stenosis of left middle cerebral artery: Secondary | ICD-10-CM | POA: Diagnosis not present

## 2020-12-29 DIAGNOSIS — I639 Cerebral infarction, unspecified: Secondary | ICD-10-CM | POA: Diagnosis not present

## 2020-12-29 DIAGNOSIS — B9561 Methicillin susceptible Staphylococcus aureus infection as the cause of diseases classified elsewhere: Secondary | ICD-10-CM | POA: Diagnosis present

## 2020-12-29 DIAGNOSIS — E440 Moderate protein-calorie malnutrition: Secondary | ICD-10-CM | POA: Insufficient documentation

## 2020-12-29 DIAGNOSIS — E860 Dehydration: Secondary | ICD-10-CM | POA: Diagnosis present

## 2020-12-29 DIAGNOSIS — I2690 Septic pulmonary embolism without acute cor pulmonale: Secondary | ICD-10-CM | POA: Diagnosis present

## 2020-12-29 DIAGNOSIS — R29728 NIHSS score 28: Secondary | ICD-10-CM | POA: Diagnosis not present

## 2020-12-29 DIAGNOSIS — D509 Iron deficiency anemia, unspecified: Secondary | ICD-10-CM | POA: Diagnosis present

## 2020-12-29 DIAGNOSIS — Z86711 Personal history of pulmonary embolism: Secondary | ICD-10-CM

## 2020-12-29 DIAGNOSIS — Z66 Do not resuscitate: Secondary | ICD-10-CM | POA: Diagnosis not present

## 2020-12-29 DIAGNOSIS — R0600 Dyspnea, unspecified: Secondary | ICD-10-CM

## 2020-12-29 DIAGNOSIS — R6521 Severe sepsis with septic shock: Secondary | ICD-10-CM | POA: Diagnosis present

## 2020-12-29 DIAGNOSIS — Z419 Encounter for procedure for purposes other than remedying health state, unspecified: Secondary | ICD-10-CM

## 2020-12-29 DIAGNOSIS — F17210 Nicotine dependence, cigarettes, uncomplicated: Secondary | ICD-10-CM | POA: Diagnosis present

## 2020-12-29 DIAGNOSIS — R64 Cachexia: Secondary | ICD-10-CM | POA: Diagnosis present

## 2020-12-29 DIAGNOSIS — Z978 Presence of other specified devices: Secondary | ICD-10-CM

## 2020-12-29 DIAGNOSIS — R4701 Aphasia: Secondary | ICD-10-CM | POA: Diagnosis not present

## 2020-12-29 DIAGNOSIS — R404 Transient alteration of awareness: Secondary | ICD-10-CM

## 2020-12-29 DIAGNOSIS — I6389 Other cerebral infarction: Secondary | ICD-10-CM | POA: Diagnosis not present

## 2020-12-29 DIAGNOSIS — I63232 Cerebral infarction due to unspecified occlusion or stenosis of left carotid arteries: Secondary | ICD-10-CM | POA: Diagnosis not present

## 2020-12-29 DIAGNOSIS — N179 Acute kidney failure, unspecified: Secondary | ICD-10-CM | POA: Diagnosis not present

## 2020-12-29 DIAGNOSIS — I339 Acute and subacute endocarditis, unspecified: Secondary | ICD-10-CM | POA: Diagnosis not present

## 2020-12-29 LAB — BASIC METABOLIC PANEL
Anion gap: 8 (ref 5–15)
Anion gap: 7 (ref 5–15)
Anion gap: 9 (ref 5–15)
BUN: 10 mg/dL (ref 6–20)
BUN: 8 mg/dL (ref 6–20)
BUN: 8 mg/dL (ref 6–20)
CO2: 22 mmol/L (ref 22–32)
CO2: 21 mmol/L — ABNORMAL LOW (ref 22–32)
CO2: 23 mmol/L (ref 22–32)
Calcium: 7.2 mg/dL — ABNORMAL LOW (ref 8.9–10.3)
Calcium: 6.9 mg/dL — ABNORMAL LOW (ref 8.9–10.3)
Calcium: 7.6 mg/dL — ABNORMAL LOW (ref 8.9–10.3)
Chloride: 99 mmol/L (ref 98–111)
Chloride: 103 mmol/L (ref 98–111)
Chloride: 97 mmol/L — ABNORMAL LOW (ref 98–111)
Creatinine, Ser: 0.47 mg/dL (ref 0.44–1.00)
Creatinine, Ser: 0.43 mg/dL — ABNORMAL LOW (ref 0.44–1.00)
Creatinine, Ser: 0.52 mg/dL (ref 0.44–1.00)
GFR, Estimated: >60 mL/min (ref >60)
GFR, Estimated: >60 mL/min (ref >60)
GFR, Estimated: >60 mL/min (ref >60)
Glucose, Bld: 93 mg/dL (ref 70–99)
Glucose, Bld: 94 mg/dL (ref 70–99)
Glucose, Bld: 121 mg/dL — ABNORMAL HIGH (ref 70–99)
Potassium: 4 mmol/L (ref 3.5–5.1)
Potassium: 3.7 mmol/L (ref 3.5–5.1)
Potassium: 3.6 mmol/L (ref 3.5–5.1)
Sodium: 129 mmol/L — ABNORMAL LOW (ref 135–145)
Sodium: 131 mmol/L — ABNORMAL LOW (ref 135–145)
Sodium: 129 mmol/L — ABNORMAL LOW (ref 135–145)

## 2020-12-29 LAB — CBC WITH DIFFERENTIAL/PLATELET
Abs Immature Granulocytes: 0.3 10*3/uL — ABNORMAL HIGH (ref 0.00–0.07)
Abs Immature Granulocytes: 0.23 10*3/uL — ABNORMAL HIGH (ref 0.00–0.07)
Basophils Absolute: 0.1 10*3/uL (ref 0.0–0.1)
Basophils Absolute: 0 10*3/uL (ref 0.0–0.1)
Basophils Relative: 0 %
Basophils Relative: 0 %
Eosinophils Absolute: 0 10*3/uL (ref 0.0–0.5)
Eosinophils Absolute: 0 10*3/uL (ref 0.0–0.5)
Eosinophils Relative: 0 %
Eosinophils Relative: 0 %
HCT: 27.7 % — ABNORMAL LOW (ref 36.0–46.0)
HCT: 21.9 % — ABNORMAL LOW (ref 36.0–46.0)
Hemoglobin: 8.7 g/dL — ABNORMAL LOW (ref 12.0–15.0)
Hemoglobin: 6.8 g/dL — CL (ref 12.0–15.0)
Immature Granulocytes: 2 %
Immature Granulocytes: 2 %
Lymphocytes Relative: 9 %
Lymphocytes Relative: 11 %
Lymphs Abs: 1.5 10*3/uL (ref 0.7–4.0)
Lymphs Abs: 1.2 10*3/uL (ref 0.7–4.0)
MCH: 23.4 pg — ABNORMAL LOW (ref 26.0–34.0)
MCH: 23.3 pg — ABNORMAL LOW (ref 26.0–34.0)
MCHC: 31.4 g/dL (ref 30.0–36.0)
MCHC: 31.1 g/dL (ref 30.0–36.0)
MCV: 74.5 fL — ABNORMAL LOW (ref 80.0–100.0)
MCV: 75 fL — ABNORMAL LOW (ref 80.0–100.0)
Monocytes Absolute: 0.4 10*3/uL (ref 0.1–1.0)
Monocytes Absolute: 0.3 10*3/uL (ref 0.1–1.0)
Monocytes Relative: 2 %
Monocytes Relative: 3 %
Neutro Abs: 14.7 10*3/uL — ABNORMAL HIGH (ref 1.7–7.7)
Neutro Abs: 9.1 10*3/uL — ABNORMAL HIGH (ref 1.7–7.7)
Neutrophils Relative %: 87 %
Neutrophils Relative %: 84 %
Platelets: 131 10*3/uL — ABNORMAL LOW (ref 150–400)
Platelets: 99 10*3/uL — ABNORMAL LOW (ref 150–400)
RBC: 3.72 MIL/uL — ABNORMAL LOW (ref 3.87–5.11)
RBC: 2.92 MIL/uL — ABNORMAL LOW (ref 3.87–5.11)
RDW: 18.6 % — ABNORMAL HIGH (ref 11.5–15.5)
RDW: 18.6 % — ABNORMAL HIGH (ref 11.5–15.5)
WBC: 17 10*3/uL — ABNORMAL HIGH (ref 4.0–10.5)
WBC: 10.9 10*3/uL — ABNORMAL HIGH (ref 4.0–10.5)
nRBC: 0.2 % (ref 0.0–0.2)
nRBC: 0 % (ref 0.0–0.2)

## 2020-12-29 LAB — BLOOD CULTURE ID PANEL (REFLEXED) - BCID2

## 2020-12-29 LAB — COMPREHENSIVE METABOLIC PANEL
ALT: 24 U/L (ref 0–44)
ALT: 16 U/L (ref 0–44)
AST: 61 U/L — ABNORMAL HIGH (ref 15–41)
AST: 41 U/L (ref 15–41)
Albumin: 2.2 g/dL — ABNORMAL LOW (ref 3.5–5.0)
Albumin: 1.7 g/dL — ABNORMAL LOW (ref 3.5–5.0)
Alkaline Phosphatase: 162 U/L — ABNORMAL HIGH (ref 38–126)
Alkaline Phosphatase: 110 U/L (ref 38–126)
Anion gap: 16 — ABNORMAL HIGH (ref 5–15)
Anion gap: 12 (ref 5–15)
BUN: 13 mg/dL (ref 6–20)
BUN: 13 mg/dL (ref 6–20)
CO2: 21 mmol/L — ABNORMAL LOW (ref 22–32)
CO2: 20 mmol/L — ABNORMAL LOW (ref 22–32)
Calcium: 8.2 mg/dL — ABNORMAL LOW (ref 8.9–10.3)
Calcium: 7.6 mg/dL — ABNORMAL LOW (ref 8.9–10.3)
Chloride: 87 mmol/L — ABNORMAL LOW (ref 98–111)
Chloride: 92 mmol/L — ABNORMAL LOW (ref 98–111)
Creatinine, Ser: 0.71 mg/dL (ref 0.44–1.00)
Creatinine, Ser: 0.8 mg/dL (ref 0.44–1.00)
GFR, Estimated: >60 mL/min (ref >60)
GFR, Estimated: >60 mL/min (ref >60)
Glucose, Bld: 127 mg/dL — ABNORMAL HIGH (ref 70–99)
Glucose, Bld: 125 mg/dL — ABNORMAL HIGH (ref 70–99)
Potassium: 3.7 mmol/L (ref 3.5–5.1)
Potassium: 3.4 mmol/L — ABNORMAL LOW (ref 3.5–5.1)
Sodium: 124 mmol/L — ABNORMAL LOW (ref 135–145)
Sodium: 124 mmol/L — ABNORMAL LOW (ref 135–145)
Total Bilirubin: 1.1 mg/dL (ref 0.3–1.2)
Total Bilirubin: 1 mg/dL (ref 0.3–1.2)
Total Protein: 7 g/dL (ref 6.5–8.1)
Total Protein: 5.4 g/dL — ABNORMAL LOW (ref 6.5–8.1)

## 2020-12-29 LAB — LACTIC ACID, PLASMA
Lactic Acid, Venous: 3.8 mmol/L (ref 0.5–1.9)
Lactic Acid, Venous: 4.5 mmol/L (ref 0.5–1.9)
Lactic Acid, Venous: 3.3 mmol/L (ref 0.5–1.9)
Lactic Acid, Venous: 3.2 mmol/L (ref 0.5–1.9)
Lactic Acid, Venous: 1.9 mmol/L (ref 0.5–1.9)
Lactic Acid, Venous: 3.2 mmol/L (ref 0.5–1.9)
Lactic Acid, Venous: 2.4 mmol/L (ref 0.5–1.9)

## 2020-12-29 LAB — CBC
HCT: 23.3 % — ABNORMAL LOW (ref 36.0–46.0)
Hemoglobin: 7.2 g/dL — ABNORMAL LOW (ref 12.0–15.0)
MCH: 23.1 pg — ABNORMAL LOW (ref 26.0–34.0)
MCHC: 30.9 g/dL (ref 30.0–36.0)
MCV: 74.7 fL — ABNORMAL LOW (ref 80.0–100.0)
Platelets: 96 10*3/uL — ABNORMAL LOW (ref 150–400)
RBC: 3.12 MIL/uL — ABNORMAL LOW (ref 3.87–5.11)
RDW: 18.6 % — ABNORMAL HIGH (ref 11.5–15.5)
WBC: 10.5 10*3/uL (ref 4.0–10.5)
nRBC: 0 % (ref 0.0–0.2)

## 2020-12-29 LAB — URINALYSIS, ROUTINE W REFLEX MICROSCOPIC
Bilirubin Urine: NEGATIVE
Glucose, UA: NEGATIVE mg/dL
Hgb urine dipstick: NEGATIVE
Ketones, ur: NEGATIVE mg/dL
Nitrite: NEGATIVE
Protein, ur: NEGATIVE mg/dL
Specific Gravity, Urine: 1.014 (ref 1.005–1.030)
pH: 5 (ref 5.0–8.0)

## 2020-12-29 LAB — PROTIME-INR
INR: 1.3 — ABNORMAL HIGH (ref 0.8–1.2)
INR: 1.5 — ABNORMAL HIGH (ref 0.8–1.2)
Prothrombin Time: 16.6 seconds — ABNORMAL HIGH (ref 11.4–15.2)
Prothrombin Time: 18.3 seconds — ABNORMAL HIGH (ref 11.4–15.2)

## 2020-12-29 LAB — I-STAT BETA HCG BLOOD, ED (MC, WL, AP ONLY): I-stat hCG, quantitative: <5 m[IU]/mL (ref <5)

## 2020-12-29 LAB — APTT: aPTT: 36 seconds (ref 24–36)

## 2020-12-29 LAB — HEMOGLOBIN AND HEMATOCRIT, BLOOD
HCT: 23.9 % — ABNORMAL LOW (ref 36.0–46.0)
Hemoglobin: 7.3 g/dL — ABNORMAL LOW (ref 12.0–15.0)

## 2020-12-29 LAB — TROPONIN I (HIGH SENSITIVITY)
Troponin I (High Sensitivity): 8 ng/L (ref <18)
Troponin I (High Sensitivity): 10 ng/L (ref <18)

## 2020-12-29 LAB — PREPARE RBC (CROSSMATCH)

## 2020-12-29 LAB — MAGNESIUM: Magnesium: 1.4 mg/dL — ABNORMAL LOW (ref 1.7–2.4)

## 2020-12-29 LAB — PHOSPHORUS: Phosphorus: 3.4 mg/dL (ref 2.5–4.6)

## 2020-12-29 MED ORDER — SODIUM CHLORIDE 0.9 % IV SOLN: INTRAVENOUS | Status: DC

## 2020-12-29 MED ORDER — LACTATED RINGERS IV BOLUS
2000.0000 mL | Freq: Once | INTRAVENOUS | Status: AC
  Administered 2020-12-29: 2000 mL via INTRAVENOUS

## 2020-12-29 MED ORDER — LACTATED RINGERS IV BOLUS
1000.0000 mL | Freq: Once | INTRAVENOUS | Status: AC
  Administered 2020-12-29: 1000 mL via INTRAVENOUS

## 2020-12-29 MED ORDER — MAGNESIUM SULFATE 2 GM/50ML IV SOLN
2.0000 g | Freq: Once | INTRAVENOUS | Status: AC
  Administered 2020-12-29: 2 g via INTRAVENOUS
  Filled 2020-12-29: qty 50

## 2020-12-29 MED ORDER — POLYETHYLENE GLYCOL 3350 17 G PO PACK: 17.0000 g | PACK | Freq: Every day | ORAL | Status: DC | PRN

## 2020-12-29 MED ORDER — SODIUM CHLORIDE 0.45 % IV SOLN: INTRAVENOUS | Status: DC

## 2020-12-29 MED ORDER — SODIUM CHLORIDE 0.9 % IV SOLN
2.0000 g | INTRAVENOUS | Status: DC
  Administered 2020-12-29: 2 g via INTRAVENOUS
  Filled 2020-12-29: qty 20

## 2020-12-29 MED ORDER — SODIUM CHLORIDE 0.9 % IV SOLN
2.0000 g | INTRAVENOUS | Status: AC
  Administered 2020-12-29: 2 g via INTRAVENOUS
  Filled 2020-12-29: qty 2

## 2020-12-29 MED ORDER — NICOTINE 14 MG/24HR TD PT24
14.0000 mg | MEDICATED_PATCH | Freq: Every day | TRANSDERMAL | Status: DC
  Administered 2021-01-05 – 2021-01-06 (×2): 14 mg via TRANSDERMAL
  Filled 2020-12-29 (×8): qty 1

## 2020-12-29 MED ORDER — POTASSIUM CHLORIDE CRYS ER 20 MEQ PO TBCR: 40.0000 meq | EXTENDED_RELEASE_TABLET | Freq: Two times a day (BID) | ORAL | Status: DC

## 2020-12-29 MED ORDER — ACETAMINOPHEN 325 MG PO TABS
650.0000 mg | ORAL_TABLET | Freq: Four times a day (QID) | ORAL | Status: DC | PRN
  Administered 2020-12-30 – 2020-12-31 (×2): 650 mg via ORAL
  Filled 2020-12-29 (×2): qty 2

## 2020-12-29 MED ORDER — ACETAMINOPHEN 500 MG PO TABS
1000.0000 mg | ORAL_TABLET | Freq: Once | ORAL | Status: AC
  Administered 2020-12-29: 1000 mg via ORAL
  Filled 2020-12-29: qty 2

## 2020-12-29 MED ORDER — CEFAZOLIN SODIUM-DEXTROSE 2-4 GM/100ML-% IV SOLN
2.0000 g | Freq: Three times a day (TID) | INTRAVENOUS | Status: DC
  Administered 2020-12-29 – 2021-01-05 (×19): 2 g via INTRAVENOUS
  Filled 2020-12-29 (×20): qty 100

## 2020-12-29 MED ORDER — SODIUM CHLORIDE 0.9 % IV SOLN: 2.0000 g | INTRAVENOUS | Status: DC

## 2020-12-29 MED ORDER — VANCOMYCIN HCL 750 MG/150ML IV SOLN
750.0000 mg | Freq: Two times a day (BID) | INTRAVENOUS | Status: DC
  Filled 2020-12-29: qty 150

## 2020-12-29 MED ORDER — NALOXONE HCL 0.4 MG/ML IJ SOLN
0.4000 mg | INTRAMUSCULAR | Status: DC | PRN
  Administered 2021-01-07: 0.4 mg via INTRAVENOUS

## 2020-12-29 MED ORDER — HYDROMORPHONE HCL 2 MG PO TABS: 2.0000 mg | ORAL_TABLET | ORAL | Status: DC | PRN

## 2020-12-29 MED ORDER — SODIUM CHLORIDE 0.9% IV SOLUTION: Freq: Once | INTRAVENOUS | Status: AC

## 2020-12-29 MED ORDER — POTASSIUM CHLORIDE CRYS ER 20 MEQ PO TBCR
40.0000 meq | EXTENDED_RELEASE_TABLET | Freq: Two times a day (BID) | ORAL | Status: AC
  Administered 2020-12-29 (×2): 40 meq via ORAL
  Filled 2020-12-29 (×2): qty 2

## 2020-12-29 MED ORDER — LACTATED RINGERS IV SOLN: INTRAVENOUS | Status: DC

## 2020-12-29 MED ORDER — KETOROLAC TROMETHAMINE 15 MG/ML IJ SOLN
15.0000 mg | Freq: Three times a day (TID) | INTRAMUSCULAR | Status: AC | PRN
  Administered 2020-12-31 – 2021-01-01 (×3): 15 mg via INTRAVENOUS
  Filled 2020-12-29 (×3): qty 1

## 2020-12-29 MED ORDER — VANCOMYCIN HCL 1250 MG/250ML IV SOLN
1250.0000 mg | Freq: Once | INTRAVENOUS | Status: AC
  Administered 2020-12-29: 1250 mg via INTRAVENOUS
  Filled 2020-12-29: qty 250

## 2020-12-29 MED ORDER — RIVAROXABAN 15 MG PO TABS
15.0000 mg | ORAL_TABLET | Freq: Two times a day (BID) | ORAL | Status: DC
  Administered 2020-12-29 – 2021-01-03 (×11): 15 mg via ORAL
  Filled 2020-12-29 (×12): qty 1

## 2020-12-29 MED ORDER — RIVAROXABAN 20 MG PO TABS: 20.0000 mg | ORAL_TABLET | Freq: Every day | ORAL | Status: DC

## 2020-12-29 NOTE — Progress Notes (Signed)
Attempted to obtain informed consent from patient per order and patient stated that she has not been told about any procedure.  Unable to obtain consent at this time.  Will notify MD.

## 2020-12-29 NOTE — ED Notes (Signed)
MD Braswell paged about Hgb 6.8

## 2020-12-29 NOTE — ED Triage Notes (Signed)
Pt brought in by her husband with c/o of an infection that has been attacking her brain and her heart - was supposed to have heart surgery 2 weeks ago but left ama. Hx of IV drug abuse, last dose this morning. HR 166 in triage and patient appears lethargic.

## 2020-12-29 NOTE — ED Notes (Signed)
MRI called to see if pt wanted MRI. Pt refused.

## 2020-12-29 NOTE — Hospital Course (Addendum)
Kathryn Medina is a 30 y.o. with a pertinent PMH of polysubstance use and IV drug use, MSSA infective endocarditis of tricuspid valve, DDD with lumbar radiculopathy, and hepatitis C who presented to [facility] ED with [facility], confusion, and tachycardia admitted for suspected ongoing endocarditis.    #Tricuspid Valve Infective Endocarditis with MSSA bacteremia, concern for sepsis #Concern for developing pneumonia per CXR Patient is presenting with similar complaints from last several admissions likely due to incompletely treated endocarditis given patient frequently leaves AMA. Patient presenting with several day history of fatigue, confusion, poor p.o. intake. Patient reports adherence to antibiotics given at last [facility] discharge. CTH was ordered to evaluate for septic brain emboli and was negative. Initial concern for sepsis, with no evidence of end organ damage though continues to be hypotensive 80-90s systolic. Additional fluid bolus on 9/30 for SBP 80s. Gram stain grew gram positive cocci and culture grew MSSA. Antibiotics were narrowed to cefazolin. ID automatically consulted for MSSA bacteremia. Repeat BC from 10/1 show NGTD, and pt continued on cefazolin ***.  TTE significant for a large vegetation 2.42 x 2.5 cm and moderate regurgitation, CT thoracic surgery consulted, and Dr Cliffton Asters performed Angiovac debridement of the tricuspid valve performed on 01/20/2021, with good debulking of the tricuspid valve, with residual vegetation not debrided. Later that day, paged by nurse for change in pt's mental state. When evaluated bedside, patient was somnolent, aphasic and unable to follow commands.  She has no strength or movement of the right upper extremity.  Left upper extremity strength 3/5. Also unable to gaze to the right. She was able to move her lower extremities bilaterally. She is high risk for CVA due to her endocarditis and large vegetation. Code stroke was called immediately. 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  Patient was not a candidate for tPA given her recent administration of Xarelto and the size of the stroke.  She also not a candidate for thrombectomy with aspect score of 5. Patient was transferred to the ICU for hypertonic saline to reduce her intracranial edema.  She was started on normal saline because of hyponatremia to avoid central pontine melanosis.  No permissive hypertension due to high risk of bleeding.   #Hypomagnesemia #Hypokalemia -Repletion of electrolytes as needed   #Hyponatremia Patient had hyponatremia to 124 on arrival. Hyponatremia likely secondary to poor p.o. intake in the last few days. IV fluids given, Na monitored with noted improvement after increased PO intake.    #History of Septic pulmonary emboli #New Left Lung Opacities  #Tachycardia Patient has had prior septic pulmonary emboli. CXR had new mid lung opacities concerning for atelectasis vs infection. Patient has crackles bilaterally on exam, and remains tachycardic.  We feel it is unlikely that patient has new PE and will hold off on CTA chest. Will continue to monitor patient's tachycardia and see if responsive to fluid.  - Xarelto 15mg  once daily - f/u response to fluids   #Anion Gap Metabolic Acidosis likely 2/2 Lactic Acidosis Patient has an elevated anion gap at 16, suspect it is secondary to lactic acidosis from infection, however starvation ketosis could also be playing a role. Less likely starvation ketosis given patient's normal blood sugar reading. IV fluids given, and lactic acid trended, returning down to 1.7.    #Opioid use disorder Polysubstance use  Patient reports last drug use 9/29. During last admission there were concerns patient was bringing in outside substances. Will  keep Narcan on board, patient again declined suboxone treatment.  COWS started. Pt withdrawing with symptoms of chills, diaphoresis, and  whole body pain, requiring up titration in methadone from 5 to 10 mg tid. Pt reports appropriate control of withdrawal symptoms with 10 tid.  -TOC consult -COWS monitoring -Methadone 10 mg tid  -Dilaudid 2mg  PO q4hr PRN for severe pain -Toradol 30mg  IV TID -Narcan PRN -Miralax PRN -Nicotine patch -monitor for signs of withdrawal -monitor for signs of acute intoxication    #Malnutrition Patient has had decreased PO intake likely secondary to substance use combined with infection.  Phos normal. - CTM BMP and Phos for refeeding syndrome -Nutrition consult, appreciate recs   #Microcytic anemia #Hx IDA Patient's hemoglobin on arrival 8.7 with low MCV 74 which appears to be patient's baseline.  After fluid bolus, patient's hemoglobin was retested the following morning and came back low at 6.8. S/p one unit RBCs on 9/30.    - daily CBC -1 unit RBCs 9/30 - transfuse <7   #Thrombocytopenia Patient has new onset thrombocytopenia, platelets were 351 on last discharge, on arrival platelets are 131.  After fluid resuscitation platelets decreased to 90s. Noted improvement in platelet count over days.

## 2020-12-29 NOTE — ED Notes (Signed)
Pt desat to upper 80's while sleeping. 3 L [place] applied. 02 sat now 95%.

## 2020-12-29 NOTE — Progress Notes (Signed)
Pharmacy Antibiotic Note  Kathryn Medina is a 30 y.o. female admitted on 12/27/2020 with infective endocarditis (MSSA bacteremia on last cultures).  Pharmacy has been consulted for Vancomycin dosing. Noted pt with multiple admissions in last 2 months for treatment of endocarditis. Unfortunately she has left AMA now multiple times without treatment. Last time she left on 9/2, she was provided with keflex and xarelto prior to her leaving AMA in hope she will have some treatment.   Plan: Vancomycin 750 mg IV Q 12 hrs. Goal AUC 400-550. Expected AUC: 530 SCr used: 0.8 Will f/u renal function, micro data, and pt's clinical condition Vanc levels prn   Height: 5\' 7"  (170.2 cm) Weight: 52.2 kg (115 lb) IBW/kg (Calculated) : 61.6  Temp (24hrs), Avg:98.4 F (36.9 C), Min:98.4 F (36.9 C), Max:98.4 F (36.9 C)  Recent Labs  Lab 12/12/2020 0033  WBC 17.0*  CREATININE 0.71  LATICACIDVEN 3.8*    Estimated Creatinine Clearance: 84.7 mL/min (by C-G formula based on SCr of 0.71 mg/dL).    Allergies  Allergen Reactions   Other Hives    pork    Antimicrobials this admission: 9/30 Vanc >>  9/30 Cefepime x 1  Microbiology results: 9/30 BCx:   Thank you for allowing pharmacy to be a part of this patient's care.  10/30, PharmD, BCPS Please see amion for complete clinical pharmacist phone list 12/08/2020 2:47 AM

## 2020-12-29 NOTE — ED Provider Notes (Signed)
Emergency Medicine Provider Triage Evaluation Note  Kathryn Medina , a 30 y.o. female  was evaluated in triage.  Pt complains of delirium, weakness, shortness of breath.  Was admitted on 9/1 with infective endocarditis and septic pulmonary emboli, history of polysubstance abuse.  Left AGAINST MEDICAL ADVICE on 9/2.  States that she continues to feel weak, short of breath and delirious.    Review of Systems  Positive: As above Negative: As above  Physical Exam  There were no vitals taken for this visit. Gen:   Pale appearing, weak  Resp:  Normal effort  MSK:   Moves extremities without difficulty  Other:    Medical Decision Making  Medically screening exam initiated at 12:09 AM.  Appropriate orders placed.  Kathryn Medina was informed that the remainder of the evaluation will be completed by another provider, this initial triage assessment does not replace that evaluation, and the importance of remaining in the ED until their evaluation is complete.  With known infective endocarditis and septic pulmonary emboli.  Blood pressure in triage 91/63, tachycardic at 163. Code sepsis initiated Nursing staff informed to room the patient as soon as possible    Mare Ferrari, PA-C 12/16/2020 0017    Melene Plan, DO 12/27/2020 [id number]

## 2020-12-29 NOTE — ED Notes (Signed)
Internal medicine at bedside assessing pt.

## 2020-12-29 NOTE — ED Provider Notes (Signed)
[facility] EMERGENCY DEPARTMENT Provider Note   CSN: [id number] Arrival date & time: 01/13/2021  0003     History Chief Complaint  Patient presents with   Blood Infection    Kathryn Medina is a 30 y.o. female.  30 yo F with a chief complaints of fevers and fatigue.  Has been going on for a few weeks now.  The patient was recently in the hospital for MSSA bacteremia secondary to IV drug abuse and left AGAINST MEDICAL ADVICE.  Was given a prescription of Keflex.  The husband said this is what has been happening to her if she gets unwilling to stay in the hospital and decide she wants to go home when she starts to get better and then has recurrence of her symptoms.  The patient is somewhat confused on exam.  Level 5 caveat altered mental status.       Past Medical History:  Diagnosis Date   Asthma    does not use inhaler   Hepatitis     Patient Active Problem List   Diagnosis Date Noted   Severe opioid use disorder (HCC)    MSSA bacteremia 11/23/2020   Septic pulmonary embolism (HCC) 11/23/2020   Infective endocarditis 11/21/2020   Acute infective endocarditis 11/19/2020   COVID-19    Hypokalemia    Polysubstance abuse (HCC)    Sepsis (HCC)    Opioid dependence with opioid-induced mood disorder (HCC)    Alcohol-induced mood disorder (HCC)    Non-reassuring fetal heart tones complicating pregnancy, antepartum 08/27/2016   Back pain affecting pregnancy 04/16/2016   Drug abuse during pregnancy (HCC) 03/19/2016   Supervision of normal first pregnancy 01/23/2016    Past Surgical History:  Procedure Laterality Date   DILATION AND EVACUATION  03/22/2012   Procedure: DILATATION AND EVACUATION;  Surgeon: Brock Bad, MD;  Location: [facility];  Service: Gynecology;  Laterality: N/A;   TEE WITHOUT CARDIOVERSION N/A 11/24/2020   Procedure: TRANSESOPHAGEAL ECHOCARDIOGRAM (TEE);  Surgeon: Thurmon Fair, MD;  Location: [facility];  Service: Cardiovascular;   Laterality: N/A;     OB History     Gravida  2   Para  1   Term  1   Preterm  0   AB  1   Living  1      SAB  1   IAB  0   Ectopic  0   Multiple  0   Live Births  1           Family History  Problem Relation Age of Onset   Cancer Paternal Grandfather    Cancer Paternal Grandmother    Emphysema Maternal Grandmother    Hypertension Father    Cancer Father     Social History   Tobacco Use   Smoking status: Every Day    Packs/day: 0.50    Years: 10.00    Pack years: 5.00    Types: Cigarettes   Smokeless tobacco: Never   Tobacco comments:    pt is trying to use the electronic cigarette  Vaping Use   Vaping Use: Never used  Substance Use Topics   Alcohol use: Yes    Comment: none in a long time  1 month   Drug use: Yes    Types: Oxycodone, Marijuana, Heroin, Cocaine, Amphetamines, Fentanyl    Comment: pt. denies    Home Medications Prior to Admission medications   Medication Sig Start Date End Date Taking? Authorizing Provider  acetaminophen (TYLENOL)  500 MG tablet Take 1,000 mg by mouth every 6 (six) hours as needed for mild pain.    [provider]  Aspirin-Salicylamide-Caffeine (BC FAST PAIN RELIEF) 650-195-33.3 MG PACK Take 1 Package by mouth daily as needed (pain).    [provider]  cephALEXin (KEFLEX) 500 MG capsule Take 1 capsule (500 mg total) by mouth 4 (four) times daily. 12/01/20 01/30/21  Marolyn Haller, MD  RIVAROXABAN Carlena Hurl) VTE STARTER PACK (15 & 20 MG) Follow package directions: Take one 15mg  tablet by mouth twice a day. On day 22, switch to one 20mg  tablet once a day. Take with food. 12/01/20   , MD    Allergies    Other  Review of Systems   Review of Systems  Unable to perform ROS: Mental status change   Physical Exam Updated Vital Signs BP 91/69 (BP Location: Left Arm)   Pulse (!) 162   Temp 98.4 F (36.9 C) (Oral)   Resp 16   SpO2 96%   Physical Exam Vitals and nursing note  reviewed.  Constitutional:      General: She is not in acute distress.    Appearance: She is well-developed. She is not diaphoretic.  HENT:     Head: Normocephalic and atraumatic.  Eyes:     Pupils: Pupils are equal, round, and reactive to light.  Cardiovascular:     Rate and Rhythm: Regular rhythm. Tachycardia present.     Heart sounds: No murmur heard.   No friction rub. No gallop.  Pulmonary:     Effort: Pulmonary effort is normal.     Breath sounds: No wheezing or rales.  Abdominal:     General: There is no distension.     Palpations: Abdomen is soft.     Tenderness: There is no abdominal tenderness.  Musculoskeletal:        General: No tenderness.     Cervical back: Normal range of motion and neck supple.  Skin:    General: Skin is warm and dry.  Neurological:     Mental Status: She is alert.     Comments: Confused, answers yes to almost every question.  Psychiatric:        Behavior: Behavior normal.    ED Results / Procedures / Treatments   Labs (all labs ordered are listed, but only abnormal results are displayed) Labs Reviewed  CULTURE, BLOOD (ROUTINE X 2)  CULTURE, BLOOD (ROUTINE X 2)  COMPREHENSIVE METABOLIC PANEL  LACTIC ACID, PLASMA  LACTIC ACID, PLASMA  CBC WITH DIFFERENTIAL/PLATELET  PROTIME-INR  URINALYSIS, ROUTINE W REFLEX MICROSCOPIC  I-STAT BETA HCG BLOOD, ED ([facility], [facility], [facility] ONLY)    EKG EKG Interpretation  Date/Time:  Friday December 29 2020 00:23:17 EDT Ventricular Rate:  167 PR Interval:  112 QRS Duration: 88 QT Interval:  284 QTC Calculation: 473 R Axis:   113 Text Interpretation: Sinus tachycardia Left posterior fascicular block T wave abnormality, consider anterolateral ischemia Abnormal ECG No significant change since last tracing Confirmed by Saturday [phone]) on 12/16/2020 12:40:40 AM  Radiology DG Chest Port 1 View  Result Date: 11/30/2020 CLINICAL DATA:  Bacteremia EXAM: PORTABLE CHEST 1 VIEW COMPARISON:  11/30/2020 FINDINGS:  Improved aeration of the right lower lobe. New opacities in the left mid lung. No pleural effusion or pneumothorax. Normal cardiomediastinal contours. IMPRESSION: Improved aeration of the right lower lobe and new opacities in the left mid lung. This may indicate atelectasis or developing infection. Electronically Signed   By: 12/31/2020  M.D.   On: 12/28/2020 00:54    Procedures Procedures   Medications Ordered in ED Medications  lactated ringers bolus 2,000 mL (has no administration in time range)    Followed by  lactated ringers infusion (has no administration in time range)  vancomycin (VANCOREADY) IVPB 1250 mg/250 mL (has no administration in time range)    ED Course  I have reviewed the triage vital signs and the nursing notes.  Pertinent labs & imaging results that were available during my care of the patient were reviewed by me and considered in my medical decision making (see chart for details).    MDM Rules/Calculators/A&P                           30 yo F with a chief complaints of altered mental status and fever.  The patient has been getting progressively worse since leaving AGAINST MEDICAL ADVICE about 20 days ago.  Having fevers and diffuse muscle aches at home.  Heart rate is into the 160s here.  We will give 2 L of IV fluids as the patient appears profoundly dehydrated.  Obtain lab work.  I discussed the case with the ED pharmacist who recommended a single dose of broad-spectrum antibiotics and deferring further management to the primary team.  Patient hyponatremia hypochloridemia consistent with dehydration.  Heart rate is improved with some IV fluids here.  Mild acidosis with anion gap.  Lactate 3.8.  Chest x-ray viewed by me with opacities in the left midlung.  Will discuss with medicine for admission.  CRITICAL CARE Performed by: Rae Roam   Total critical care time: 35 minutes  Critical care time was exclusive of separately billable procedures and  treating other patients.  Critical care was necessary to treat or prevent imminent or life-threatening deterioration.  Critical care was time spent personally by me on the following activities: development of treatment plan with patient and/or surrogate as well as nursing, discussions with consultants, evaluation of patient's response to treatment, examination of patient, obtaining history from patient or surrogate, ordering and performing treatments and interventions, ordering and review of laboratory studies, ordering and review of radiographic studies, pulse oximetry and re-evaluation of patient's condition.   The patients results and plan were reviewed and discussed.   Any x-rays performed were independently reviewed by myself.   Differential diagnosis were considered with the presenting HPI.  Medications  lactated ringers bolus 2,000 mL (2,000 mLs Intravenous New Bag/Given 12/08/2020 0122)    Followed by  lactated ringers infusion (has no administration in time range)  vancomycin (VANCOREADY) IVPB 1250 mg/250 mL (has no administration in time range)  ceFEPIme (MAXIPIME) 2 g in sodium chloride 0.9 % 100 mL IVPB (has no administration in time range)  acetaminophen (TYLENOL) tablet 1,000 mg (1,000 mg Oral Given 12/10/2020 0142)    Vitals:   12/24/2020 0100 12/04/2020 0108 12/24/2020 0110 12/12/2020 0130  BP:    108/63  Pulse:    (!) 142  Resp:    (!) 25  Temp:      TempSrc:      SpO2:   96% 95%  Weight: 58.8 kg 52.2 kg    Height:  5\' 7"  (1.702 m)      Final diagnoses:  None    Admission/ observation were discussed with the admitting physician, patient and/or family and they are comfortable with the plan.   Final Clinical Impression(s) / ED Diagnoses Final diagnoses:  None  Rx / DC Orders ED Discharge Orders     None        Melene Plan, [place] Jan 22, 2021 [phone]

## 2020-12-29 NOTE — Progress Notes (Signed)
PHARMACY - PHYSICIAN COMMUNICATION CRITICAL VALUE ALERT - BLOOD CULTURE IDENTIFICATION (BCID)  Kathryn Medina is an 30 y.o. female who presented to [facility] on 12/04/2020 with a chief complaint of shortness of breath, tachycardia and confusion.   Assessment: 30 year old female with known history of MSSA bacteremia with TV endocarditis. She has unfortunately left AMA without a full course of treatment most recently on 9/2. Blood cultures from early this morning are positive in both sets for MSSA. Noted a CT of the head from this morning showed no acute abnormalities.   Name of physician (or Provider) Contacted: Dr. Sharene Butters ([facility]) ID Team - Auto-consult for Staph aureus   Current antibiotics: Vanc/Ceftriaxone   Changes to prescribed antibiotics recommended:  Recommend narrowing to MSSA coverage with cefazolin   Results for orders placed or performed during the hospital encounter of 12/20/2020  Blood Culture ID Panel (Reflexed) (Collected: 12/02/2020 12:33 AM)  Result Value Ref Range   Enterococcus faecalis NOT DETECTED NOT DETECTED   Enterococcus Faecium NOT DETECTED NOT DETECTED   Listeria monocytogenes NOT DETECTED NOT DETECTED   Staphylococcus species DETECTED (A) NOT DETECTED   Staphylococcus aureus (BCID) DETECTED (A) NOT DETECTED   Staphylococcus epidermidis NOT DETECTED NOT DETECTED   Staphylococcus lugdunensis NOT DETECTED NOT DETECTED   Streptococcus species NOT DETECTED NOT DETECTED   Streptococcus agalactiae NOT DETECTED NOT DETECTED   Streptococcus pneumoniae NOT DETECTED NOT DETECTED   Streptococcus pyogenes NOT DETECTED NOT DETECTED   A.calcoaceticus-baumannii NOT DETECTED NOT DETECTED   Bacteroides fragilis NOT DETECTED NOT DETECTED   Enterobacterales NOT DETECTED NOT DETECTED   Enterobacter cloacae complex NOT DETECTED NOT DETECTED   Escherichia coli NOT DETECTED NOT DETECTED   Klebsiella aerogenes NOT DETECTED NOT DETECTED   Klebsiella oxytoca NOT DETECTED NOT  DETECTED   Klebsiella pneumoniae NOT DETECTED NOT DETECTED   Proteus species NOT DETECTED NOT DETECTED   Salmonella species NOT DETECTED NOT DETECTED   Serratia marcescens NOT DETECTED NOT DETECTED   Haemophilus influenzae NOT DETECTED NOT DETECTED   Neisseria meningitidis NOT DETECTED NOT DETECTED   Pseudomonas aeruginosa NOT DETECTED NOT DETECTED   Stenotrophomonas maltophilia NOT DETECTED NOT DETECTED   Candida albicans NOT DETECTED NOT DETECTED   Candida auris NOT DETECTED NOT DETECTED   Candida glabrata NOT DETECTED NOT DETECTED   Candida krusei NOT DETECTED NOT DETECTED   Candida parapsilosis NOT DETECTED NOT DETECTED   Candida tropicalis NOT DETECTED NOT DETECTED   Cryptococcus neoformans/gattii NOT DETECTED NOT DETECTED   Meth resistant mecA/C and MREJ NOT DETECTED NOT DETECTED    Sharin Mons, PharmD, BCPS, BCIDP Infectious Diseases Clinical Pharmacist Phone: [phone] 12/03/2020  2:35 PM

## 2020-12-29 NOTE — ED Notes (Signed)
Pt back from CT

## 2020-12-29 NOTE — Consult Note (Signed)
[facility]       Reason for Consult: MSSA bacteremia    Referring Physician: CHAMP autoconsult  Active Problems:   Bacteremia    nicotine  14 mg Transdermal Daily   potassium chloride  40 mEq Oral BID   rivaroxaban  15 mg Oral BID   [START ON 01/18/21] rivaroxaban  20 mg Oral Q supper    Recommendations:  Cefazolin TTE Repeat blood cultures in 1-2 days  Assessment: She has known TV endocarditis and her blood cultures are again positive for MSSA.  On cefazolin.   Agree with lumbar MRI for back pain.  CT head without any lesions.    Antibiotics: cefazolin  HPI: Kathryn Medina is a 30 y.o. female with a history of IVDA and known TV endocarditis who left AMA earlier this month (and mulitiple times previously) presented with feeling poorly and sob.  She was due to get an angiovac last admission but left before the procedure.  She does not give any other history to me.  History from the chart notes that she has been feeling poorly progressively over the last week, fever and fatigue.  She did report taking the cephalexin and Xarelto.  Uses heroin, fentanyl, cocaine, marijuana.     Review of Systems:  Constitutional: positive for fevers, chills, sweats, fatigue, malaise, and anorexia Gastrointestinal: negative for diarrhea All other systems reviewed and are negative    Past Medical History:  Diagnosis Date   Asthma    does not use inhaler   Hepatitis     Social History   Tobacco Use   Smoking status: Every Day    Packs/day: 0.50    Years: 10.00    Pack years: 5.00    Types: Cigarettes   Smokeless tobacco: Never   Tobacco comments:    pt is trying to use the electronic cigarette  Vaping Use   Vaping Use: Never used  Substance Use Topics   Alcohol use: Yes    Comment: none in a long time  1 month   Drug use: Yes    Types: Oxycodone, Marijuana, Heroin, Cocaine, Amphetamines, Fentanyl    Comment: pt. denies    Family History   Problem Relation Age of Onset   Cancer Paternal Grandfather    Cancer Paternal Grandmother    Emphysema Maternal Grandmother    Hypertension Father    Cancer Father     Allergies  Allergen Reactions   Other Hives    pork    Physical Exam: Constitutional: in no apparent distress, ill appearing Vitals:   12/04/2020 1425 12/17/2020 1505  BP: (!) 88/61 99/74  Pulse: (!) 103 (!) 104  Resp: 19 (!) 21  Temp:    SpO2: 94% 97%   EYES: anicteric ENMT: no thrush Cardiovascular: Cor RRR Respiratory: clear; Musculoskeletal: no pedal edema noted Skin: negatives: no rash Neuro: non-focal  Lab Results  Component Value Date   WBC 10.9 (H) 12/27/2020   HGB 6.8 (LL) 12/23/2020   HCT 21.9 (L) 12/27/2020   MCV 75.0 (L) 12/25/2020   PLT 99 (L) 12/18/2020    Lab Results  Component Value Date   CREATININE 0.80 12/10/2020   BUN 13 12/16/2020   NA 124 (L) 12/06/2020   K 3.4 (L) 12/20/2020   CL 92 (L) 12/18/2020   CO2 20 (L) 12/06/2020    Lab Results  Component Value Date   ALT 16 12/15/2020   AST 41 12/07/2020   ALKPHOS 110 12/14/2020  Microbiology: Recent Results (from the past 240 hour(s))  Culture, blood (Routine x 2)     Status: None (Preliminary result)   Collection Time: 11/30/2020 12:33 AM   Specimen: BLOOD  Result Value Ref Range Status   Specimen Description BLOOD SITE NOT SPECIFIED  Final   Special Requests   Final    BOTTLES DRAWN AEROBIC AND ANAEROBIC Blood Culture adequate volume   Culture  Setup Time   Final    GRAM POSITIVE COCCI IN CLUSTERS AEROBIC BOTTLE ONLY CRITICAL VALUE NOTED.  VALUE IS CONSISTENT WITH PREVIOUSLY REPORTED AND CALLED VALUE. Performed at [facility], [address] [place], [place] [address]    Culture GRAM POSITIVE COCCI  Final   Report Status PENDING  Incomplete  Culture, blood (Routine x 2)     Status: None (Preliminary result)   Collection Time: 12/03/2020 12:33 AM   Specimen: BLOOD  Result Value Ref Range Status    Specimen Description BLOOD SITE NOT SPECIFIED  Final   Special Requests AEROBIC BOTTLE ONLY Blood Culture adequate volume  Final   Culture  Setup Time   Final    GRAM POSITIVE COCCI IN CLUSTERS AEROBIC BOTTLE ONLY CRITICAL RESULT CALLED TO, READ BACK BY AND VERIFIED WITH: PHARMD E.SINCLAIR AT 1428 ON 11/30/2020 BY T.SAAD. Performed at [facility], [address] [place], [place] [address]    Culture GRAM POSITIVE COCCI IN CLUSTERS  Final   Report Status PENDING  Incomplete  Blood Culture ID Panel (Reflexed)     Status: Abnormal   Collection Time: 12/02/2020 12:33 AM  Result Value Ref Range Status   Enterococcus faecalis NOT DETECTED NOT DETECTED Final   Enterococcus Faecium NOT DETECTED NOT DETECTED Final   Listeria monocytogenes NOT DETECTED NOT DETECTED Final   Staphylococcus species DETECTED (A) NOT DETECTED Final    Comment: CRITICAL RESULT CALLED TO, READ BACK BY AND VERIFIED WITH: PHARMD E.SINCLAIR AT 1428 ON 12/01/2020 BY T.SAAD.    Staphylococcus aureus (BCID) DETECTED (A) NOT DETECTED Final    Comment: CRITICAL RESULT CALLED TO, READ BACK BY AND VERIFIED WITH: PHARMD E.SINCLAIR AT 1428 ON 12/11/2020 BY T.SAAD.    Staphylococcus epidermidis NOT DETECTED NOT DETECTED Final   Staphylococcus lugdunensis NOT DETECTED NOT DETECTED Final   Streptococcus species NOT DETECTED NOT DETECTED Final   Streptococcus agalactiae NOT DETECTED NOT DETECTED Final   Streptococcus pneumoniae NOT DETECTED NOT DETECTED Final   Streptococcus pyogenes NOT DETECTED NOT DETECTED Final   A.calcoaceticus-baumannii NOT DETECTED NOT DETECTED Final   Bacteroides fragilis NOT DETECTED NOT DETECTED Final   Enterobacterales NOT DETECTED NOT DETECTED Final   Enterobacter cloacae complex NOT DETECTED NOT DETECTED Final   Escherichia coli NOT DETECTED NOT DETECTED Final   Klebsiella aerogenes NOT DETECTED NOT DETECTED Final   Klebsiella oxytoca NOT DETECTED NOT DETECTED Final   Klebsiella pneumoniae NOT  DETECTED NOT DETECTED Final   Proteus species NOT DETECTED NOT DETECTED Final   Salmonella species NOT DETECTED NOT DETECTED Final   Serratia marcescens NOT DETECTED NOT DETECTED Final   Haemophilus influenzae NOT DETECTED NOT DETECTED Final   Neisseria meningitidis NOT DETECTED NOT DETECTED Final   Pseudomonas aeruginosa NOT DETECTED NOT DETECTED Final   Stenotrophomonas maltophilia NOT DETECTED NOT DETECTED Final   Candida albicans NOT DETECTED NOT DETECTED Final   Candida auris NOT DETECTED NOT DETECTED Final   Candida glabrata NOT DETECTED NOT DETECTED Final   Candida krusei NOT DETECTED NOT DETECTED Final   Candida parapsilosis  NOT DETECTED NOT DETECTED Final   Candida tropicalis NOT DETECTED NOT DETECTED Final   Cryptococcus neoformans/gattii NOT DETECTED NOT DETECTED Final   Meth resistant mecA/C and MREJ NOT DETECTED NOT DETECTED Final    Comment: Performed at [facility], [address] [place], [place] [address]    Gardiner Barefoot, MD [facility] [email or web address] 01-19-2021, 3:13 PM

## 2020-12-29 NOTE — ED Notes (Signed)
Pt systolic b/p in 80's, MD Asalm made aware.

## 2020-12-29 NOTE — H&P (Signed)
Date: 12/21/2020               Patient Name:  Kathryn Medina MRN: [id number]  DOB: 07/01/1990 Age / Sex: 30 y.o., female   PCP: Pcp, No         Medical Service: Internal Medicine Teaching Service         Attending Physician: Dr. Inez Catalina, MD    First Contact: Ellison Carwin, MD Pager: EZ [phone]  Second Contact: Quincy Simmonds, MD Pager: Eustaquio Maize [phone]       After Hours (After 5p/  First Contact Pager: [phone]  weekends / holidays): Second Contact Pager: [phone]   SUBJECTIVE   Chief Complaint: SHOB, tachycardia and confusion  History of Present Illness:  Kathryn Medina is a 30 y.o. with a pertinent PMH of polysubstance use and IV drug use, MSSA infective endocarditis of tricuspid valve, DDD with lumbar radiculopathy, and hepatitis C who presented to [facility] ED with [facility], confusion, and tachycardia.   Patient had three recent admissions to [facility] for MSSA bacteremia and TV IE with septic pulmonary emboli (8/23, 8/21, 9/2). Patient leaves AMA after each admission, last AMA discharge on 9/2. Patient says today that she "should have listened" last time. She has been feeling progressively worse over the last week. She has been having increasing SHOB, fatigue, palpitations, and fevers up to 103. Denies any abdominal pain, chest pain, or cough more so than her normal smokers cough. Her partner at beside says that she really has only been sleeping for the past five days or so and has not been able to do the normal activities of living by herself without his help. She has not eaten much of anything at all but has has some water to drink. She says that she has been taking her PO Keflex as well as her Xarelto as prescribed and did know the doses/frequency she was supposed to take them.   Last used IV drugs citing fentanyl yesterday in addition to cocaine and marijuana. Was not interested in starting suboxone at this time.    ED Course: Patient found to be tachycardic to the 160s on arrival  with a blood pressure of 90s systolic, HR improved to the 140s and BP increased to 110s after a liter of fluid. Leukocytosis of 17 but afebrile. Lactic acid elevated to 3.8.  Meds:   No current facility-administered medications on file prior to encounter.   Current Outpatient Medications on File Prior to Encounter  Medication Sig Dispense Refill   acetaminophen (TYLENOL) 500 MG tablet Take 1,000 mg by mouth every 6 (six) hours as needed for mild pain.     Aspirin-Salicylamide-Caffeine (BC FAST PAIN RELIEF) 650-195-33.3 MG PACK Take 1 Package by mouth daily as needed (pain).     cephALEXin (KEFLEX) 500 MG capsule Take 1 capsule (500 mg total) by mouth 4 (four) times daily. 240 capsule 0   ibuprofen (ADVIL) 200 MG tablet Take 200-800 mg by mouth every 6 (six) hours as needed for headache or moderate pain.     RIVAROXABAN (XARELTO) VTE STARTER PACK (15 & 20 MG) Follow package directions: Take one 15mg  tablet by mouth twice a day. On day 22, switch to one 20mg  tablet once a day. Take with food. 51 each 0     Past Medical History:  Diagnosis Date   Asthma    does not use inhaler   Hepatitis     Past Surgical History:  Procedure Laterality Date   DILATION AND EVACUATION  03/22/2012   Procedure: DILATATION AND EVACUATION;  Surgeon: Brock Bad, MD;  Location: [facility];  Service: Gynecology;  Laterality: N/A;   TEE WITHOUT CARDIOVERSION N/A 11/24/2020   Procedure: TRANSESOPHAGEAL ECHOCARDIOGRAM (TEE);  Surgeon: Thurmon Fair, MD;  Location: [facility] ENDOSCOPY;  Service: Cardiovascular;  Laterality: N/A;    Social:  - Currently lives with her partner and other room mates - Not currently working due to multiple hospitalizations recently  - reports use of marijuana, cocaine, meth, heroin, fentanyl - last IV drug use reportedly yesterday - smokes cigarettes 1 PPD  Family History:  Family History  Problem Relation Age of Onset   Cancer Paternal Grandfather    Cancer Paternal Grandmother     Emphysema Maternal Grandmother    Hypertension Father    Cancer Father     Allergies: Allergies as of 12/17/2020 - Review Complete 12/08/2020  Allergen Reaction Noted   Other Hives 08/25/2016    Review of Systems: A complete ROS was negative except as per HPI.   OBJECTIVE:   Physical Exam: Blood pressure 112/70, pulse (!) 135, temperature 98.4 F (36.9 C), temperature source Oral, resp. rate (!) 23, height 5\' 7"  (1.702 m), weight 52.2 kg, SpO2 95 %.   Constitutional: chronically ill appearing, cachectic woman resting uncomfortably in bed HENT: normocephalic atraumatic, mucous membranes dry Eyes: conjunctiva non-erythematous Neck: supple Cardiovascular: tachycardic, regular, no m/r/g Pulmonary/Chest: tachypnea, increased work of breathing, crackles up the mid-lung, worse on the right  Abdominal: soft, non-tender, non-distended MSK: normal bulk and tone Neurological: drowsy but arousable, answering questions appropriately Skin: warm and dry Psych: normal affect  Labs: CBC    Component Value Date/Time   WBC 17.0 (H) 11/30/2020 0033   RBC 3.72 (L) 12/12/2020 0033   HGB 8.7 (L) 12/13/2020 0033   HGB 10.8 (L) 06/18/2016 1110   HCT 27.7 (L) 12/05/2020 0033   HCT 32.9 (L) 06/18/2016 1110   PLT 131 (L) 12/23/2020 0033   PLT 206 06/18/2016 1110   MCV 74.5 (L) 12/28/2020 0033   MCV 97 06/18/2016 1110   MCH 23.4 (L) 12/28/2020 0033   MCHC 31.4 12/23/2020 0033   RDW 18.6 (H) 12/16/2020 0033   RDW 12.7 06/18/2016 1110   LYMPHSABS 1.5 12/25/2020 0033   LYMPHSABS 2.1 01/23/2016 1317   MONOABS 0.4 12/05/2020 0033   EOSABS 0.0 12/17/2020 0033   EOSABS 0.2 01/23/2016 1317   BASOSABS 0.1 12/11/2020 0033   BASOSABS 0.0 01/23/2016 1317     CMP     Component Value Date/Time   NA 124 (L) 12/24/2020 0033   K 3.7 12/15/2020 0033   CL 87 (L) 12/01/2020 0033   CO2 21 (L) 12/24/2020 0033   GLUCOSE 127 (H) 12/23/2020 0033   BUN 13 12/28/2020 0033   CREATININE 0.71 12/26/2020  0033   CALCIUM 8.2 (L) 12/02/2020 0033   PROT 7.0 12/06/2020 0033   ALBUMIN 2.2 (L) 12/24/2020 0033   AST 61 (H) 12/10/2020 0033   ALT 24 12/23/2020 0033   ALKPHOS 162 (H) 12/01/2020 0033   BILITOT 1.1 12/06/2020 0033   GFRNONAA >60 12/09/2020 0033   GFRAA >60 11/18/2018 0021    Imaging: CT HEAD WO CONTRAST (11/20/2018)  Result Date: 12/09/2020 CLINICAL DATA:  Encephalopathy EXAM: CT HEAD WITHOUT CONTRAST TECHNIQUE: Contiguous axial images were obtained from the base of the skull through the vertex without intravenous contrast. COMPARISON:  None. FINDINGS: Brain: There is no mass, hemorrhage or extra-axial collection. The size and configuration of the ventricles and extra-axial  CSF spaces are normal. The brain parenchyma is normal, without acute or chronic infarction. Vascular: No abnormal hyperdensity of the major intracranial arteries or dural venous sinuses. No intracranial atherosclerosis. Skull: The visualized skull base, calvarium and extracranial soft tissues are normal. Sinuses/Orbits: Right mastoid and middle ear fluid, unchanged. The orbits are normal. IMPRESSION: Normal brain. Electronically Signed   By: Deatra Robinson M.D.   On: January 25, 2021 03:48   DG Chest Port 1 View  Result Date: 25-Jan-2021 CLINICAL DATA:  Bacteremia EXAM: PORTABLE CHEST 1 VIEW COMPARISON:  11/30/2020 FINDINGS: Improved aeration of the right lower lobe. New opacities in the left mid lung. No pleural effusion or pneumothorax. Normal cardiomediastinal contours. IMPRESSION: Improved aeration of the right lower lobe and new opacities in the left mid lung. This may indicate atelectasis or developing infection. Electronically Signed   By: Deatra Robinson M.D.   On: 25-Jan-2021 00:54    EKG: personally reviewed my interpretation is sinus tachycardia, new left posterior fascicular block, normal interval   ASSESSMENT & PLAN:    Assessment & Plan by Problem: Active Problems:   Bacteremia   Kathryn Medina is a 30 y.o. with  a pertinent PMH of polysubstance use and IV drug use, MSSA infective endocarditis of tricuspid valve, DDD with lumbar radiculopathy, and hepatitis C who presented to [facility] ED with [facility], confusion, and tachycardia admitted for suspected ongoing endocarditis.   #Tricuspid Valve Infective Endocarditis with MSSA bacteremia  Patient is coming in with a similar presentation to last several admission likely due to incompletely treated endocarditis. Patient does reports taking all of her antibiotics. However husband and patient have both expressed a concerning change in her level of consciousness and increasing periods of confusion raising concern for continued showering of septic emboli. Will order CT head to assess for change in mental status. Will also continue her on broad spectrum antibiotics. Given new EKG findings will order troponins as well as a repeat EKG to get better reading with less artifact - f/u CT head - s/p 1L fluid - continuous cardiac monitoring - f/u repeat EKG - continue ceftriaxone and vanc - MRI lumbar spine if patient will tolerate  - Xarelto 15mg  BID - consider consult to CT surgery if patient is still eligible for angiovac if she does no leave AMA  #History of Septic pulmonary emboli #New Left Lung Opacities  Patient has had prior septic pulmonary emboli. CXR had new mid lung opacities concerning for atelectasis vs infection. Patient also had crackles bilaterally on exam. On presentation patient was initially tachycardic and tachypneic, tachycardia is still ongoing but is responding to fluids. Patient's still saturating well on room air and is already on xarelto anticoagulation. (Reportedly adherent with medications) Obtaining more lung imaging at this time to assess for more PEs will not likely change management at this time. Will continue to monitor patient's tachycardia and see if it continues to respond to fluid.  - continue xarelto - f/u response to fluids  #Anion Gap  Metabolic Acidosis likely 2/2 Lactic Acidosis Patient has an elevated anion gap at 16, suspect it is secondary to lactic acidosis from infection, however starvation ketosis could also be playing a role. Less likely starvation ketosis given patient's normal blood sugar reading. Patient's lactic acid has increased from 3.8 to 4.5 after the 2 L of fluids, will continue to trend and give another fluid bolus. - giving another 1 L LR bolus followed by 250 mL/hr 8 hours -trend lactic acid  #Opioid use disorder Polysubstance  use  Patient reports last drug use yesterday. During last admission there were concerns patient was bringing in outside substances. Will keep Narcan on board, patient again declined suboxone treatment. Will also continue to monitor for signs of withdrawal/preventing withdrawal to try and keep the patient from wanting to leave AMA. -TOC consult -COWS monitoring -Dilaudid 2mg  PO q4hr PRN for severe pain -Toradol 30mg  IV TID -Narcan PRN -Miralax PRN -Nicotine patch -monitor for signs of withdrawal -monitor for signs of acute intoxication   #Malnutrition Patient has had decreased PO intake likely secondary to substance use combined with infection. If patient stays long enough to continue treatment can consider nutrition consult for dietary supplementation - CTM BMP and Phos for refeeding syndrome  #Normocytic Anemia #Hx IDA Patient's hemoglobin is 8.7 which appears to be patient's baseline. Low MCV is 74.5 - daily CBC - transfuse <7  #Thrombocytopenia Patient has new onset thrombocytopenia, platelets were 351 on last discharge, today they are 131.  - CTM daily CBC  Diet: Normal VTE:  xarelto IVF: LR,Bolus followed by 250/hr for 8 hours Code: Full  Prior to Admission Living Arrangement: Home, living with partner and roommates Anticipated Discharge Location: Home Barriers to Discharge: Continued medical workup  Dispo: Admit patient to Inpatient with expected length of  stay greater than 2 midnights.  Signed: , MD Internal Medicine Resident PGY-1 Pager: [phone]  12/11/2020, 3:52 AM

## 2020-12-29 NOTE — Progress Notes (Addendum)
Subjective: No acute events since overnight admission.  Patient seen in the ED this morning on rounds. She is tired this morning, nodding yes and no with minimal verbalization. She denies SOB, chest pain, abdominal pain, fever, back pain. She endorses some anxiety. Denies sweating, trembling.   Husband is at bedside. All questions were addressed.   Patient desaturated to 80s while sleeping.  Put on 3L [place] with improvement in saturations.  Objective:  Vital signs in last 24 hours: Vitals:   12/21/2020 0230 12/22/2020 0655 12/17/2020 0655 12/03/2020 0820  BP: 98/67 93/61 93/61    Pulse: (!) 139 (!) 126 (!) 124 (!) 103  Resp: (!) 26 (!) 27 (!) 23 (!) 27  Temp:   99.2 F (37.3 C)   TempSrc:   Oral   SpO2: 95% 91% 93% 93%  Weight:      Height:       Physical Exam: General: chronically ill appearing young caucasian female, cachectic, NAD HENT: normocephalic, atraumatic EYES: conjunctiva non-erythematous, no scleral icterus CV: regular rate, normal rhythm, no murmurs, rubs, gallops. Pulmonary: sating well on RA, bilateral lower lobe crackles. Abdominal: non-distended, soft, non-tender to palpation, normal BS Skin: Warm and sweaty, no rashes or lesions Neurological: MS: awake, drowsy, nods yes and no with minimal verbalization.  Motor: moves all extremities antigravity Psych: normal affect   Assessment/Plan:  Active Problems:   Bacteremia  Kathryn Medina is a 30 y.o. with a pertinent PMH of polysubstance use and IV drug use, MSSA infective endocarditis of tricuspid valve, DDD with lumbar radiculopathy, and hepatitis C who presented to [facility] ED with [facility], confusion, and tachycardia admitted for suspected ongoing endocarditis.    #Tricuspid Valve Infective Endocarditis with MSSA bacteremia, concern for sepsis #Concern for developing pneumonia per CXR Patient is presenting with similar complaints from last several admissions likely due to incompletely treated endocarditis given patient  frequently leaves AMA.  Patient presenting with several day history of fatigue, confusion, poor p.o. intake. Patient reports adherence to antibiotics given at last [facility] discharge. CTH was ordered to evaluate for septic brain emboli and was negative. Initial concern for sepsis, with no evidence of end organ damagethough continues to be hypotensive 80-90s systolic. Additional fluid bolus on 9/30 for SBP 80s.  -ID following, appreciate recommendations - Continue Cefazolin (abx started 9/30) -TTE -Repeat blood cultures on 10/1 or 10/2 - 124ml/hr maintenance fluids NS - Patient has new oxygen requirement, currently on 3L [place] - Continuous cardiac monitoring -Blood gram stain grew gram-positive cocci in clusters, cultures grew MSSA - MRI lumbar spine if patient will tolerate, not urgent, patient denies back pain without paraspinal or spinal tenderness. - consider consult to CT surgery if patient is still eligible for angiovac if she does no leave AMA   #Hypomagnesemia #Hypokalemia -Repletion of electrolytes as needed  #Hyponatremia Patient had hyponatremia to 124 on arrival. Hyponatremia likely secondary to poor p.o. intake in the last few days. -Switch to 150 mL/h normal saline -q 6hr BMPs  #History of Septic pulmonary emboli #New Left Lung Opacities  #Tachycardia Patient has had prior septic pulmonary emboli. CXR had new mid lung opacities concerning for atelectasis vs infection. Patient has crackles bilaterally on exam, and remains tachycardic.  We feel it is unlikely that patient has new PE and will hold off on CTA chest.  Will continue to monitor patient's tachycardia and see if responsive to fluid.  - Xarelto 20mg  once daily - f/u response to fluids   #Anion Gap Metabolic Acidosis likely  2/2 Lactic Acidosis Patient has an elevated anion gap at 16, suspect it is secondary to lactic acidosis from infection, however starvation ketosis could also be playing a role. Less likely starvation  ketosis given patient's normal blood sugar reading.  Lactic acid increased from 3.8-4.5, now decreasing, most recent repeat LA 3.2. -150 mL/hour NS -trend lactic acid   #Opioid use disorder Polysubstance use  Patient reports last drug use 9/29. During last admission there were concerns patient was bringing in outside substances. Will keep Narcan on board, patient again declined suboxone treatment.  No COWS scores documented thus far. -TOC consult -COWS monitoring -Dilaudid 2mg  PO q4hr PRN for severe pain -Toradol 30mg  IV TID -Narcan PRN -Miralax PRN -Nicotine patch -monitor for signs of withdrawal -monitor for signs of acute intoxication    #Malnutrition Patient has had decreased PO intake likely secondary to substance use combined with infection.  Phos normal. - CTM BMP and Phos for refeeding syndrome -Nutrition consult, appreciate recs   #Microcytic anemia #Hx IDA Patient's hemoglobin on arrival 8.7 with low MCV 74 which appears to be patient's baseline.  After fluid bolus, patient's hemoglobin was retested the following morning and came back low at 6.8.    - daily CBC -1 unit RBCs 9/30 - transfuse <7   #Thrombocytopenia Patient has new onset thrombocytopenia, platelets were 351 on last discharge, on arrival platelets are 131.  After fluid resuscitation platelets decreased to 90s. - CTM daily CBC  Prior to Admission Living Arrangement: Home Anticipated Discharge Location: Home Barriers to Discharge: IV antibiotics for bacteremia Dispo: Anticipated discharge in approximately several day(s).   , MD 12/21/2020, 9:04 AM PagerEllison Carwin After 5pm on weekdays and 1pm on weekends: On Call pager [phone]

## 2020-12-30 ENCOUNTER — Encounter (HOSPITAL_COMMUNITY): Payer: Self-pay | Admitting: Internal Medicine

## 2020-12-30 ENCOUNTER — Other Ambulatory Visit (HOSPITAL_COMMUNITY): Payer: Self-pay

## 2020-12-30 DIAGNOSIS — L8990 Pressure ulcer of unspecified site, unspecified stage: Secondary | ICD-10-CM | POA: Insufficient documentation

## 2020-12-30 LAB — TYPE AND SCREEN
ABO/RH(D): O POS
Antibody Screen: NEGATIVE
Unit division: 0

## 2020-12-30 LAB — BASIC METABOLIC PANEL
Anion gap: 7 (ref 5–15)
Anion gap: 7 (ref 5–15)
BUN: 8 mg/dL (ref 6–20)
BUN: 6 mg/dL (ref 6–20)
CO2: 23 mmol/L (ref 22–32)
CO2: 22 mmol/L (ref 22–32)
Calcium: 7.3 mg/dL — ABNORMAL LOW (ref 8.9–10.3)
Calcium: 7.5 mg/dL — ABNORMAL LOW (ref 8.9–10.3)
Chloride: 97 mmol/L — ABNORMAL LOW (ref 98–111)
Chloride: 99 mmol/L (ref 98–111)
Creatinine, Ser: 0.47 mg/dL (ref 0.44–1.00)
Creatinine, Ser: 0.47 mg/dL (ref 0.44–1.00)
GFR, Estimated: >60 mL/min (ref >60)
GFR, Estimated: >60 mL/min (ref >60)
Glucose, Bld: 100 mg/dL — ABNORMAL HIGH (ref 70–99)
Glucose, Bld: 94 mg/dL (ref 70–99)
Potassium: 4.2 mmol/L (ref 3.5–5.1)
Potassium: 4.2 mmol/L (ref 3.5–5.1)
Sodium: 127 mmol/L — ABNORMAL LOW (ref 135–145)
Sodium: 128 mmol/L — ABNORMAL LOW (ref 135–145)

## 2020-12-30 LAB — LACTIC ACID, PLASMA: Lactic Acid, Venous: 1.7 mmol/L (ref 0.5–1.9)

## 2020-12-30 LAB — BPAM RBC
Blood Product Expiration Date: 202210302359
ISSUE DATE / TIME: 202209300956
Unit Type and Rh: 5100

## 2020-12-30 LAB — MAGNESIUM: Magnesium: 1.8 mg/dL (ref 1.7–2.4)

## 2020-12-30 LAB — CBC
HCT: 24.4 % — ABNORMAL LOW (ref 36.0–46.0)
Hemoglobin: 7.8 g/dL — ABNORMAL LOW (ref 12.0–15.0)
MCH: 24.1 pg — ABNORMAL LOW (ref 26.0–34.0)
MCHC: 32 g/dL (ref 30.0–36.0)
MCV: 75.3 fL — ABNORMAL LOW (ref 80.0–100.0)
Platelets: 91 10*3/uL — ABNORMAL LOW (ref 150–400)
RBC: 3.24 MIL/uL — ABNORMAL LOW (ref 3.87–5.11)
RDW: 18.7 % — ABNORMAL HIGH (ref 11.5–15.5)
WBC: 11.7 10*3/uL — ABNORMAL HIGH (ref 4.0–10.5)
nRBC: 0 % (ref 0.0–0.2)

## 2020-12-30 MED ORDER — LACTATED RINGERS IV BOLUS
500.0000 mL | Freq: Once | INTRAVENOUS | Status: AC
  Administered 2020-12-30: 500 mL via INTRAVENOUS

## 2020-12-30 MED ORDER — BUPRENORPHINE HCL-NALOXONE HCL 2-0.5 MG SL SUBL: 1.0000 | SUBLINGUAL_TABLET | Freq: Three times a day (TID) | SUBLINGUAL | Status: DC

## 2020-12-30 MED ORDER — METHADONE HCL 5 MG PO TABS
5.0000 mg | ORAL_TABLET | Freq: Two times a day (BID) | ORAL | Status: DC
Start: 2020-12-30 — End: 2020-12-31
  Administered 2020-12-30 – 2020-12-31 (×3): 5 mg via ORAL
  Filled 2020-12-30 (×3): qty 1

## 2020-12-30 NOTE — Progress Notes (Signed)
   12/30/20 0247  Assess: MEWS Score  Temp 100 F (37.8 C)  BP 99/63  Pulse Rate (!) 127  ECG Heart Rate (!) 126  Resp (!) 26  Level of Consciousness Alert  SpO2 93 %  O2 Device Nasal Cannula  Patient Activity (if Appropriate) In bed  O2 Flow Rate (L/min) 2 L/min  Assess: MEWS Score  MEWS Temp 0  MEWS Systolic 1  MEWS Pulse 2  MEWS RR 2  MEWS LOC 0  MEWS Score 5  MEWS Score Color Red  Assess: if the MEWS score is Yellow or Red  Were vital signs taken at a resting state? Yes  Focused Assessment No change from prior assessment  Early Detection of Sepsis Score *See Row Information* Low  MEWS guidelines implemented *See Row Information* Yes  Treat  MEWS Interventions Escalated (See documentation below)  Pain Scale 0-10  Pain Score 0  Take Vital Signs  Increase Vital Sign Frequency  Red: Q 1hr X 4 then Q 4hr X 4, if remains red, continue Q 4hrs  Escalate  MEWS: Escalate Red: discuss with charge nurse/RN and provider, consider discussing with RRT  Notify: Charge Nurse/RN  Name of Charge Nurse/RN Notified Nikki  Date Charge Nurse/RN Notified 12/30/20  Time Charge Nurse/RN Notified 0247  Notify: Provider  Provider Name/Title Dr. Darrick Huntsman  Date Provider Notified 12/30/20  Time Provider Notified [phone]  Notification Type Page  Notification Reason Other (Comment) (MWs changed from yellow to red)  Provider response See new orders  Date of Provider Response 12/30/20  Time of Provider Response 0252  Document  Patient Outcome Stabilized after interventions

## 2020-12-30 NOTE — Progress Notes (Signed)
Subjective: Remained tachycardic with BPs in the 90s, additional 500 mL LR bolus.  Patient this morning appears uncomfortable she does endorse anxiety and  sweating.  She does note that she has been on Suboxone before but but caused nausea and vomiting.  Is refusing Suboxone, would like to try methadone.  States she will leave AMA if she cannot get anything for her symptoms. She denies any back pain or other pain at this time.  Objective:  Vital signs in last 24 hours: Vitals:   12/30/20 0300 12/30/20 0346 12/30/20 0400 12/30/20 0456  BP: 94/60 102/71 108/68   Pulse: (!) 124 (!) 124 90 100  Resp: (!) 27 (!) 24 (!) 23   Temp: 100 F (37.8 C) 99.2 F (37.3 C) 99 F (37.2 C) 98.2 F (36.8 C)  TempSrc: Oral Oral Oral Oral  SpO2: 92% 93% 93% 92%  Weight:      Height:       Physical Exam: General: chronically ill appearing young caucasian female, cachectic, NAD, appears uncomfortable, diaphoretic HENT: normocephalic, atraumatic EYES: conjunctiva non-erythematous, no scleral icterus CV: Tachycardia, faint systolic murmur best heard at the right upper  sternal border Pulmonary: sating well on RA, clear to auscultation Abdominal: non-distended, soft, non-tender to palpation, normal BS Skin: Warm and sweaty, no rashes or lesions Neurological: MS: awake, drowsy, nods yes and no with minimal verbalization.  Motor: moves all extremities antigravity Psych: normal affect, anxious appearing  Assessment/Plan:  Active Problems:   Bacteremia   Pressure injury of skin  Kathryn Medina is a 30 y.o. with a pertinent PMH of polysubstance use and IV drug use, MSSA infective endocarditis of tricuspid valve, DDD with lumbar radiculopathy, and hepatitis C who presented to [facility] ED with [facility], confusion, and tachycardia admitted for suspected MSSA bacteremia likely in the setting of known tricuspid valve endocarditis.   #Tricuspid Valve Infective Endocarditis with MSSA bacteremia, concern for  sepsis Patient is presenting with similar complaints from last several admissions likely due to incompletely treated endocarditis given patient frequently leaves AMA.  Patient presenting with several day history of fatigue, confusion, poor p.o. intake. Patient reports adherence to antibiotics given at last [facility] discharge.  Blood cultures redemonstrating MSSA bacteremia.  Blood pressures improved with fluid challenge.  Lactic acid resolved.  Leukocytosis 11.7.  Patient refusing MRI lumbar spine yesterday and denies having any back pain today. - Continue cefazolin 2g every 8 hours - Will hold off on MRI unless patient develops back pain and patient is agreeable - TTE - Follow-up repeat blood cultures. - Continuous cardiac monitoring - MRI lumbar spine if patient will tolerate, not urgent, patient denies back pain without paraspinal or spinal tenderness. - consider consult to CT surgery if patient is still eligible for angiovac if she does not leave AMA  #Opioid use disorder Polysubstance use  Patient reports last drug use 9/29.  Has had no opioids since admission.  This morning COWS increased from 2 overnight to 9.  Patient endorses anxiety and diaphoresis.  She appears uncomfortable on exam.  She has tried Suboxone in the past which caused nausea and vomiting.  She declines Suboxone at this time.  Not currently ready to stop using opioids.  We will try methadone to help with withdrawal symptoms this morning. -Start methadone 5 mg twice daily -COWS monitoring -Toradol 30mg  IV TID as needed for pain -Narcan PRN -Miralax PRN -Nicotine patch -monitor for signs of acute intoxication  -TOC consulted   #Hypomagnesemia, hypokalemia, resolved #Hypokalemia -monitor  electrolytes, replete as needed  #Hyponatremia Likely in setting of poor solute intake.  Patient had hyponatremia to 124 on arrival.  Improved to 131 overnight, she was switched to half-normal saline at 75 mL/h sodium this morning of  128. -Monitor BMP  #History of Septic pulmonary emboli #New Left Lung Opacities  #Tachycardia Patient has had prior septic pulmonary emboli. CXR had new mid lung opacities concerning for atelectasis vs infection.  She was maintaining oxygen saturations this morning on room air no crackles on lung exam.  Remains tachycardic although this may be related to opioid withdrawal.  We feel it is unlikely that patient has new PE and will hold off on CTA chest.   - Xarelto 20mg  once daily   #Anion Gap Metabolic Acidosis likely 2/2 Lactic Acidosis- resolved Patient has an elevated anion gap at 16, likely in the setting of lactic acidosis.  Lactic acid resolved this morning 1.7.  #Malnutrition Patient has had decreased PO intake likely secondary to substance use combined with infection.   -Nutrition consult, appreciate recs   #Microcytic anemia #Hx IDA Transfused with 1 unit of packed RBCs.  Hemoglobin improved from 6.8-7 point this morning - monitor CBC - transfuse <7   #Thrombocytopenia Patient has new onset thrombocytopenia, platelets were 351 on last discharge, on arrival platelets are 131.  Platelets stable at 91 this morning.  No signs of bleeding. -Monitor CBCs  Prior to Admission Living Arrangement: Home Anticipated Discharge Location: Home Barriers to Discharge: IV antibiotics for bacteremia Dispo: Anticipated discharge in approximately several day(s).   , MD 12/30/2020, 6:31 AM Pager: [phone] After 5pm on weekdays and 1pm on weekends: On Call pager [phone]

## 2020-12-30 NOTE — Progress Notes (Signed)
Echo attempted. Patient care in progress. IV team also waiting to get in room. Will attempt again as schedule permits.

## 2020-12-30 NOTE — Progress Notes (Signed)
BP is 99/55; HR  126 and R 33.  Mews score changed from yellow to red.  Md notified and an order to give 500 bolus received.  Will continue to monitor.

## 2020-12-30 NOTE — Plan of Care (Signed)
  Problem: Pain Managment: Goal: General experience of comfort will improve Outcome: Progressing   Problem: Safety: Goal: Ability to remain free from injury will improve Outcome: Progressing   Problem: Skin Integrity: Goal: Risk for impaired skin integrity will decrease Outcome: Progressing   

## 2020-12-30 NOTE — Progress Notes (Signed)
Patients COWS 9, notified MD. Patient refused suboxone and telemetry monitoring. Pateint verbalized wanting to leave the hospital [facility] notified MD.

## 2020-12-30 DEATH — deceased

## 2020-12-31 ENCOUNTER — Inpatient Hospital Stay (HOSPITAL_COMMUNITY): Payer: Medicaid Other

## 2020-12-31 DIAGNOSIS — R7881 Bacteremia: Secondary | ICD-10-CM

## 2020-12-31 LAB — CULTURE, BLOOD (ROUTINE X 2)
Special Requests: ADEQUATE
Special Requests: ADEQUATE

## 2020-12-31 LAB — ECHOCARDIOGRAM LIMITED
Height: 67 in
Weight: 2225.76 oz

## 2020-12-31 LAB — CBC
HCT: 24.9 % — ABNORMAL LOW (ref 36.0–46.0)
Hemoglobin: 8.1 g/dL — ABNORMAL LOW (ref 12.0–15.0)
MCH: 24.5 pg — ABNORMAL LOW (ref 26.0–34.0)
MCHC: 32.5 g/dL (ref 30.0–36.0)
MCV: 75.5 fL — ABNORMAL LOW (ref 80.0–100.0)
Platelets: 107 10*3/uL — ABNORMAL LOW (ref 150–400)
RBC: 3.3 MIL/uL — ABNORMAL LOW (ref 3.87–5.11)
RDW: 19.8 % — ABNORMAL HIGH (ref 11.5–15.5)
WBC: 14.8 10*3/uL — ABNORMAL HIGH (ref 4.0–10.5)
nRBC: 0.1 % (ref 0.0–0.2)

## 2020-12-31 LAB — BASIC METABOLIC PANEL
Anion gap: 10 (ref 5–15)
BUN: 5 mg/dL — ABNORMAL LOW (ref 6–20)
CO2: 20 mmol/L — ABNORMAL LOW (ref 22–32)
Calcium: 7.8 mg/dL — ABNORMAL LOW (ref 8.9–10.3)
Chloride: 98 mmol/L (ref 98–111)
Creatinine, Ser: 0.57 mg/dL (ref 0.44–1.00)
GFR, Estimated: >60 mL/min (ref >60)
Glucose, Bld: 96 mg/dL (ref 70–99)
Potassium: 4.3 mmol/L (ref 3.5–5.1)
Sodium: 128 mmol/L — ABNORMAL LOW (ref 135–145)

## 2020-12-31 MED ORDER — METHADONE HCL 5 MG PO TABS
5.0000 mg | ORAL_TABLET | Freq: Once | ORAL | Status: AC
  Administered 2020-12-31: 5 mg via ORAL
  Filled 2020-12-31: qty 1

## 2020-12-31 MED ORDER — LACTATED RINGERS IV BOLUS
1000.0000 mL | Freq: Once | INTRAVENOUS | Status: AC
  Administered 2020-12-31: 1000 mL via INTRAVENOUS

## 2020-12-31 MED ORDER — METHADONE HCL 5 MG PO TABS
5.0000 mg | ORAL_TABLET | Freq: Three times a day (TID) | ORAL | Status: DC
  Administered 2020-12-31 – 2021-01-01 (×3): 5 mg via ORAL
  Filled 2020-12-31 (×3): qty 1

## 2020-12-31 NOTE — Progress Notes (Signed)
Paged by nurse reporting patient complaining of pain and withdrawal symptoms.  Had been given 5 mg of methadone approximately 9 PM with no improvement in symptoms.  COWS score after administration up to 20.  Evaluated patient at bedside patient reporting continued pain sweating.  No other symptoms reported.  On physical exam she is alert and oriented tachycardic but regular rhythm.  She reports that she has been on 30 methadone in the past.  Has only gotten approximately 50 mg today.  We will give another 5 tonight.  May need to uptitrate tomorrow.

## 2020-12-31 NOTE — Plan of Care (Signed)

## 2020-12-31 NOTE — Progress Notes (Signed)
HR is 108 and MEWs score turns yellow. Telemetry order expired and patient is a progressive level of care. MD notified and telemetry order renewed.  Will continue to monitor.

## 2020-12-31 NOTE — Progress Notes (Signed)
Subjective: Patient continues to report diaphoresis and withdrawal symptoms.  States her nausea improved since yesterday but shortly after taking her methadone this morning has felt more tired and sweaty.  She denies fevers or chills.  Denies new or worsening pain.  Aside from improving nausea she still feels that she is withdrawing. Denies fevers or chills.   Objective:  Vital signs in last 24 hours: Vitals:   12/31/20 0000 12/31/20 0147 12/31/20 0351 12/31/20 0400  BP: 99/62 97/63 102/77 102/69  Pulse: (!) 122 (!) 117 100 99  Resp:  (!) 25 (!) 21 20  Temp: 99.2 F (37.3 C) 99 F (37.2 C) 99.3 F (37.4 C) 99 F (37.2 C)  TempSrc: Oral Oral Oral   SpO2: 92% 94% 91% 93%  Weight:      Height:       Physical Exam: General: chronically ill appearing young caucasian female, cachectic, NAD, appears uncomfortable, sheet are soaked with sweat HENT: normocephalic, atraumatic EYES: conjunctiva non-erythematous, no scleral icterus CV: Tachycardia, faint systolic murmur best heard at the right upper  sternal border Pulmonary: sating well on RA, clear to auscultation, tachypnea  Abdominal: non-distended, soft, non-tender to palpation Skin: Hot and sheets are soaked, no rashes or lesions Neurological: MS: awake, drowsy, easily arousal, converses appropriately Motor: moves all extremities antigravity  Assessment/Plan:  Active Problems:   Bacteremia   Pressure injury of skin  Kathryn Medina is a 30 y.o. with a pertinent PMH of polysubstance use and IV drug use, MSSA infective endocarditis of tricuspid valve, DDD with lumbar radiculopathy, and hepatitis C who presented to [facility] ED with [facility], confusion, and tachycardia admitted for suspected MSSA bacteremia likely in the setting of known tricuspid valve endocarditis.   #Tricuspid Valve Infective Endocarditis with MSSA bacteremia, concern for sepsis Patient is presenting with similar complaints from last several admissions likely due to  incompletely treated endocarditis given patient frequently leaves AMA.  Patient presenting with several day history of fatigue, confusion, poor p.o. intake.Blood cultures redemonstrating MSSA bacteremia. Repeat cultures from 10/1 with no growth to date. Patient very diaphoretic this morning, Tmax overnight 100F and tachycardic to 120s with worsening leukocytosis.  - ID following, appreciate reccomendations  - LR bolus - Continue cefazolin 2g every 8 hours - Will hold off on MRI unless patient develops back pain and patient is agreeable - follow up TTE - Follow-up repeat blood cultures. - Continuous cardiac monitoring - MRI lumbar spine if patient will tolerate, not urgent, patient denies back pain without paraspinal or spinal tenderness.  #Opioid use disorder Polysubstance use  Patient reports last drug use 9/29.  Patient continues to report withdrawal symptoms, very diaphoretic on exam.  This could be due to withdrawal versus side effect of methadone versus bacteremia.  We will increase methadone to see if this improves her symptoms and diaphoresis. -Increase methadone 5 mg three times daily -COWS monitoring -Toradol 30mg  IV TID as needed for pain -Narcan PRN -Miralax PRN -Nicotine patch -monitor for signs of acute intoxication  -TOC consulted  #Hyponatremia Likely in setting of poor solute intake.  Remained stable at 128 this morning. -Monitor BMP  #History of Septic pulmonary emboli #New Left Lung Opacities  #Tachycardia Patient has had prior septic pulmonary emboli. CXR had new mid lung opacities concerning for atelectasis vs infection.  She was maintaining oxygen saturations this morning on room air no crackles on lung exam.  Remains tachycardic although this may be related to opioid withdrawal.  Denies any respiratory symptoms. -  Xarelto 20mg  once daily   #Malnutrition Patient has had decreased PO intake likely secondary to substance use combined with infection.   -Nutrition  consult, appreciate recs   #Microcytic anemia #Hx IDA Received 1 unit of packed RBCs on 9/30.  Hemoglobin stable today at 8.1.  Does not appear to be bleeding. - monitor CBC - transfuse <7   #Thrombocytopenia Patient has new onset thrombocytopenia, platelets were 351 on last discharge, on arrival platelets are 131.  No signs of bleeding.  Platelets of 107 this morning. -Monitor CBCs  Prior to Admission Living Arrangement: Home Anticipated Discharge Location: Home Barriers to Discharge: IV antibiotics for bacteremia Dispo: Anticipated discharge in approximately several day(s).   10/30, MD 12/31/2020, 6:52 AM Pager: [phone] After 5pm on weekdays and 1pm on weekends: On Call pager [phone]

## 2020-12-31 NOTE — Progress Notes (Addendum)
Subjective: Overnight pt complained of sweating, pain, and heart racing, complaining that she is withdrawing; overnight team gave an additional methadone 5 mg.  This AM, patient was seen at bedside during rounds. Pt reports feeling like she is still withdrawing, complaining of sweating and whole-body pain. Pt has not been cooperative with nursing staff for blood draws and EKG, she is educated on the importance of labs for proper treatment and monitoring.   Pt has no other complains or concerns at this time, and expresses that she will be more cooperative.   Objective:  Vital signs in last 24 hours: Vitals:   12/31/20 1800 12/31/20 1818 12/31/20 1822 12/31/20 2007  BP: 112/66   121/79  Pulse: (!) 120 (!) 124 (!) 123 99  Resp: (!) 35 (!) 32 (!) 33 18  Temp:    99 F (37.2 C)  TempSrc:    Oral  SpO2: 98% 99% 93% 92%  Weight:      Height:       Constitutional: cachectic, ill-appearing, alert, NAD HENT: normocephalic, atraumatic, mucous membranes moist Eyes: conjunctiva non-erythematous, extraocular movements intact Cardiovascular: tachycardic, regular rhythm, no m/r/g Pulmonary/Chest: normal work of breathing on room air, lungs clear to auscultation bilaterally Abdominal: soft, non-tender to palpation, non-distended MSK: normal bulk and tone Neurological: alert & oriented x 3, drowsy, converses appropriately  Skin: warm and dry, excess sweating noted  Psych: normal behavior, normal affect   Assessment/Plan:  Active Problems:   Bacteremia   Pressure injury of skin  Kathryn Medina is a 30 y.o. with a pertinent PMH of polysubstance use and IV drug use, MSSA infective endocarditis of tricuspid valve, DDD with lumbar radiculopathy, and hepatitis C admitted for suspected MSSA bacteremia in the setting of known tricuspid valve endocarditis.    Tricuspid Valve Infective Endocarditis with MSSA bacteremia, concern for sepsis Patient is presenting with similar complaints from last  several admissions (fatigue, confusion, poor p.o. intake) likely due to incompletely treated endocarditis given patient frequently leaves AMA. Blood cultures on admission demonstrate MSSA bacteremia. Repeat cultures from 10/1 with NGTD. TTE significant for a very large vegetation 2.42 x 2.5 cm and moderate regurgitation. Tmax overnight 100F and tachycardic to 120s with worsening leukocytosis, additional 5 mg methadone given overnight. This AM, patient very diaphoretic and tachycardic, gave additional methadone 5 mg, and will increase methadone 5 mg tid -> 10 mg tid.  - ID following, appreciate reccomendations  - Continue cefazolin 2g every 8 hours, day 4  - Follow-up repeat blood cultures. - Continuous cardiac monitoring - F/u on EKG - Consider MRI lumbar spine if patient complains of focal spinal tenderness, and is cooperative as she has a hx of refusing an MRI.    Opioid use disorder Polysubstance use  Patient reports last drug use 9/29. Yesterday, pt reported withdrawal symptoms and was diaphoretic on exam. Sxs could be withdrawal versus side effect of methadone versus bacteremia. Increased methadone 5 tid to 10 mg tid today, provided pt cooperates with staff to get an EKG.  -methadone 10 mg tid -COWS monitoring -Toradol 30mg  IV TID as needed for pain -Narcan PRN -Miralax PRN -Nicotine patch -monitor for signs of acute intoxication  -TOC consulted   Hyponatremia Likely in setting of poor solute intake. Unable to get labs today as pt was not cooperative with nursing staff during numerous attempts. She is educated on the importance of labs for effective tx and monitoring.  -Monitor BMP   History of Septic pulmonary emboli New Left Lung Opacities  Tachycardia Patient has had prior septic pulmonary emboli. CXR showing new mid lung opacities concerning for atelectasis vs infection. Maintaining oxygen saturations this morning on RA, no crackles on lung exam. Remains tachycardic although this  may be related to opioid withdrawal. Denies any respiratory symptoms. - Xarelto 20mg  once daily   Malnutrition Patient has had decreased PO intake likely secondary to substance use combined with infection.   -Nutrition consulted, appreciate recs   Microcytic anemia Hx IDA S/p 1 unit of pRBCs on 9/30.  Hemoglobin stable on yesterday's labs. No signs of bleeding.  - monitor CBC - transfuse <7   Thrombocytopenia Pt has new onset thrombocytopenia, platelets were 351 on last discharge, arrival platelets are 131. May be from underlying infection. No signs of bleeding. Platelets of 107 yesterday. -Monitor CBCs   Best Practice: Diet: Normal IVF: None,None VTE: Xarelto Code: Full  Signature: 10/30, MD  Internal Medicine Resident, PGY-1 [facility]  Pager: [phone] After 5pm on weekdays and 1pm on weekends: On Call pager [phone]

## 2020-12-31 NOTE — Progress Notes (Signed)
  Echocardiogram 2D Echocardiogram has been performed.  Roosvelt Maser F 12/31/2020, 12:43 PM

## 2020-12-31 NOTE — Progress Notes (Addendum)
Nutrition Brief Note  Patient identified on the Malnutrition Screening Tool (MST) Report and consulted for assess of nutritional requirements/ status.   Wt Readings from Last 15 Encounters:  2021-01-28 63.1 kg  12/01/20 58.8 kg  11/26/20 61.2 kg  11/25/20 59.4 kg  11/18/20 55.3 kg  09/24/17 56.2 kg  01/08/17 65.1 kg  08/27/16 73.9 kg  08/22/16 72.8 kg  08/15/16 73 kg  08/08/16 72 kg  08/01/16 70.4 kg  07/25/16 72.6 kg  07/18/16 72.6 kg  07/03/16 73 kg   Kemaria Dedic is a 30 y.o. with a pertinent PMH of polysubstance use and IV drug use, MSSA infective endocarditis of tricuspid valve, DDD with lumbar radiculopathy, and hepatitis C who presented to [facility] ED with [facility], confusion, and tachycardia admitted for suspected ongoing endocarditis.    Pt admitted with sepsis with history of MSSA endocarditis and bacteremia.  Reviewed I/O's: +434 ml x 24 hours and +8.3 L since admission  Pt unavailable at time of visit. Attempted to speak with pt via call to hospital room phone, however, unable to reach. RD unable to obtain further nutrition-related history or complete nutrition-focused physical exam at this time.    Albumin has a half-life of 21 days and is strongly affected by stress response and inflammatory process, therefore, do not expect to see an improvement in this lab value during acute hospitalization. When a patient presents with low albumin, it is likely skewed due to the acute inflammatory response.  Unless it is suspected that patient had poor PO intake or malnutrition prior to admission, then RD should not be consulted solely for low albumin. Note that low albumin is no longer used to diagnose malnutrition; [place] uses the new malnutrition guidelines published by the American Society for Parenteral and Enteral Nutrition (A.S.P.E.N.) and the Academy of Nutrition and Dietetics (AND).     Labs reviewed: Na: 128.    Current diet order is regular, patient is consuming approximately  n/a% of meals at this time. Labs and medications reviewed.   No nutrition interventions warranted at this time. If nutrition issues arise, please consult RD.   Levada Schilling, RD, LDN, CDCES Registered Dietitian II Certified Diabetes Care and Education Specialist Please refer to [facility] for RD and/or RD on-call/weekend/after hours pager

## 2020-12-31 NOTE — Progress Notes (Signed)
RN assessed performed COWS assessment on patient and she scored a 14 with HR 124, RR 30-40. RN gave patient methadone per [facility] and notified MD. MD gave orders to reassess COWS in 1hr. Patient was calm and laying in bed when RN left room. Will continue to monitor.

## 2020-12-31 NOTE — Progress Notes (Signed)
Patient Tachypnea and Tachycardic. MD notified. New orders given for LR bolus. Bolus given per MAR . Will continue to monitor.

## 2021-01-01 DIAGNOSIS — F1193 Opioid use, unspecified with withdrawal: Secondary | ICD-10-CM | POA: Diagnosis present

## 2021-01-01 MED ORDER — LIDOCAINE VISCOUS HCL 2 % MT SOLN
15.0000 mL | Freq: Once | OROMUCOSAL | Status: AC
  Filled 2021-01-01: qty 15

## 2021-01-01 MED ORDER — METHADONE HCL 5 MG PO TABS
5.0000 mg | ORAL_TABLET | Freq: Once | ORAL | Status: AC
  Administered 2021-01-01: 5 mg via ORAL
  Filled 2021-01-01: qty 1

## 2021-01-01 MED ORDER — METHADONE HCL 5 MG PO TABS
10.0000 mg | ORAL_TABLET | Freq: Three times a day (TID) | ORAL | Status: DC
Start: 2021-01-01 — End: 2021-01-05
  Administered 2021-01-01 – 2021-01-03 (×8): 10 mg via ORAL
  Filled 2021-01-01 (×8): qty 1

## 2021-01-01 MED ORDER — ALUM & MAG HYDROXIDE-SIMETH 200-200-20 MG/5ML PO SUSP
30.0000 mL | Freq: Once | ORAL | Status: AC
  Filled 2021-01-01: qty 30

## 2021-01-01 NOTE — Plan of Care (Signed)
  Problem: Education: Goal: Knowledge of General Education information will improve Description Including pain rating scale, medication(s)/side effects and non-pharmacologic comfort measures Outcome: Progressing   Problem: Health Behavior/Discharge Planning: Goal: Ability to manage health-related needs will improve Outcome: Progressing   

## 2021-01-01 NOTE — Progress Notes (Signed)
Patient refusing for ekg to be completed. Patient stated that I can F**K myself and I can leave her room. Notified MD.

## 2021-01-01 NOTE — Progress Notes (Signed)
   12/31/20 2100  Assess: MEWS Score  Temp 98.7 F (37.1 C)  BP 112/67  Pulse Rate (!) 130  ECG Heart Rate (!) 129  Resp (!) 34  Level of Consciousness Alert  SpO2 94 %  O2 Device Room Air  Assess: MEWS Score  MEWS Temp 0  MEWS Systolic 0  MEWS Pulse 2  MEWS RR 2  MEWS LOC 0  MEWS Score 4  MEWS Score Color Red  Assess: if the MEWS score is Yellow or Red  Were vital signs taken at a resting state? Yes  Focused Assessment No change from prior assessment  Early Detection of Sepsis Score *See Row Information* Low  MEWS guidelines implemented *See Row Information* Yes  Treat  MEWS Interventions Administered prn meds/treatments  Take Vital Signs  Increase Vital Sign Frequency  Red: Q 1hr X 4 then Q 4hr X 4, if remains red, continue Q 4hrs  Escalate  MEWS: Escalate Yellow: discuss with charge nurse/RN and consider discussing with provider and RRT  Notify: Charge Nurse/RN  Name of Charge Nurse/RN Notified Nikki  Date Charge Nurse/RN Notified 12/31/20  Time Charge Nurse/RN Notified 2110  Notify: Provider  Provider Name/Title Heywood Footman  Date Provider Notified 12/31/20  Time Provider Notified 2230  Notification Type Page  Notification Reason Other (Comment) (COWS score of 20)  Provider response En route  Date of Provider Response 12/31/20  Time of Provider Response 2245  Document  Patient Outcome Not stable and remains on department  Progress note created (see row info) Yes

## 2021-01-01 NOTE — Progress Notes (Signed)
Phlebotomy stopped by at 2200 to draw some labs and patient refused and asked her to come back later.  At 0000 she came back and patient refused again asking for lab to be drawn at 0800.  She is in bed resting.  Will continue to monitor.

## 2021-01-01 NOTE — Progress Notes (Addendum)
Subjective: No overnight events   This AM, patient was seen at bedside during rounds. Pt reports improvement in withdrawal symptoms since her methadone adjustment. She does not notice much sweating, and has had no trouble sleeping. She denies SOB, palpitations, and chest pain.   Pt is more cooperative with nursing staff today.   Pt has no other complains or concerns at this time, and expresses that would like to move forward with treatment.   Objective:  Vital signs in last 24 hours: Vitals:   01/01/21 0824 01/01/21 0900 01/01/21 1024 01/01/21 1224  BP: (!) 94/52 113/74 100/66 94/66  Pulse: (!) 123 (!) 124 (!) 121 89  Resp: 20 (!) 21 20 (!) 24  Temp: 99 F (37.2 C) 99 F (37.2 C) 99.1 F (37.3 C) 97.9 F (36.6 C)  TempSrc: Oral Oral Oral Oral  SpO2: 92% 91% 91% 98%  Weight:      Height:       Constitutional: cachectic, well appearing, alert, NAD HENT: normocephalic, atraumatic, mucous membranes moist Eyes: conjunctiva non-erythematous, extraocular movements intact Cardiovascular: tachycardic, regular rhythm, no m/r/g, non-edematous extremities Pulmonary/Chest: normal work of breathing on room air, lungs clear to auscultation bilaterally Abdominal: soft, non-tender to palpation, non-distended MSK: normal bulk and tone Neurological: alert & oriented x 3, converses appropriately  Skin: warm and dry, diaphoresis not noticed today   Psych: normal behavior, normal affect   Assessment/Plan:  Active Problems:   Acute infective endocarditis   MSSA bacteremia   Severe opioid use disorder (HCC)   Bacteremia   Pressure injury of skin   Opioid use with withdrawal (HCC)  Kathryn Medina is a 30 y.o. with a pertinent PMH of polysubstance use and IV drug use, MSSA infective endocarditis of tricuspid valve, DDD with lumbar radiculopathy, and hepatitis C admitted for suspected MSSA bacteremia in the setting of known tricuspid valve endocarditis.    Tricuspid Valve Infective  Endocarditis with MSSA bacteremia, concern for sepsis Patient is presenting with similar complaints from last several admissions (fatigue, confusion, poor p.o. intake) likely due to incompletely treated endocarditis given patient frequently leaves AMA. Blood cultures on admission demonstrate MSSA bacteremia. Repeat cultures from 10/1 with NGTD. TTE significant for a large vegetation 2.42 x 2.5 cm and moderate regurgitation. Will order a TEE if indicated by TCTS. Will consider an MRI spine in future if pt agreeable or complains of back pain. Pt remains afebrile and states she has noticed an improvement in withdrawal symptoms with methadone adjustment from 5 mg tid -> 10 mg tid.  - Consult CT surgery for angiovac  - ID following, appreciate reccomendations  - Continue cefazolin 2g every 8 hours, day 5  - Follow-up repeat blood cultures. - Continuous cardiac monitoring   Opioid use disorder Polysubstance use  Patient reports last drug use 9/29. Improved withdrawal symptoms with methadone increased to 10 mg tid.  -methadone 10 mg tid -COWS monitoring -Toradol 30mg  IV TID as needed for pain -Narcan PRN -Miralax PRN -Nicotine patch -monitor for signs of acute intoxication  -TOC consulted   Hyponatremia Improved from 129 to 131, and pt reports improvement in PO intake.  -Monitor BMP   History of Septic pulmonary emboli New Left Lung Opacities  Tachycardia Patient has had prior septic pulmonary emboli. CXR showing new mid lung opacities concerning for atelectasis vs infection. Maintaining oxygen saturations this morning on RA, no crackles on lung exam. Remains tachycardic although this may be related to opioid withdrawal. Denies any respiratory symptoms and is afebrile.  -  Xarelto 20mg  once daily   Malnutrition Patient has had decreased PO intake likely secondary to substance use combined with infection.   -Nutrition consulted, appreciate recs   Microcytic anemia Hx IDA S/p 1 unit of  pRBCs on 9/30.  Hemoglobin stable. No signs of bleeding.  - monitor CBC - transfuse <7   Thrombocytopenia Pt has new onset thrombocytopenia, platelets were 351 on last discharge, arrival platelets are 131. May be from underlying infection. No signs of bleeding. Platelet count improving from 107 2 days ago to 150.  -Monitor CBCs   Best Practice: Diet: Normal IVF: None,None VTE: Xarelto Code: Full  Signature: 10/30, MD  Internal Medicine Resident, PGY-1 [facility]  Pager: [phone] After 5pm on weekdays and 1pm on weekends: On Call pager [phone]

## 2021-01-01 NOTE — Progress Notes (Signed)
Notified by lab that patient has refused lab draws again. Lab will not try to pull again for 24 hours. Notified MD.

## 2021-01-01 NOTE — Progress Notes (Signed)
At 2000 COWS score was 4.  RN rounded on patient and found her crying hysterically.  This RN asked patient what was going on and she responded that she was hurting and that the pain medication that was given is not helpful; she further stated "If you are not helping me, I will leave and go get high on dope"  On assessment, patient was drenched in sweat and COWS was re-scored which was 20.  MD was paged with call back.  MD rounded on patient and ordered a one time dose of methadone.  She is in bed resting.  Will continue to monitor.

## 2021-01-01 NOTE — Progress Notes (Signed)
[facility]   Reason for visit: Follow up on TV endocarditis  Interval History: TTE noted and significant for a very large vegetation 2.42 x 2.5 cm and moderate regurgitation.  AV leaflet also with concerns.   Day 5 total antibiotics Day 4 cefazolin  Physical Exam: Constitutional:  Vitals:   01/01/21 0900 01/01/21 1024  BP: 113/74 100/66  Pulse: (!) 124 (!) 121  Resp: (!) 21 20  Temp: 99 F (37.2 C) 99.1 F (37.3 C)  SpO2: 91% 91%   patient appears in NAD Respiratory: Normal respiratory effort; CTA B Cardiovascular: RRR   Review of Systems: Constitutional: negative for fevers and chills Gastrointestinal: negative for nausea and diarrhea  Lab Results  Component Value Date   WBC 14.8 (H) 12/31/2020   HGB 8.1 (L) 12/31/2020   HCT 24.9 (L) 12/31/2020   MCV 75.5 (L) 12/31/2020   PLT 107 (L) 12/31/2020    Lab Results  Component Value Date   CREATININE 0.57 12/31/2020   BUN 5 (L) 12/31/2020   NA 128 (L) 12/31/2020   K 4.3 12/31/2020   CL 98 12/31/2020   CO2 20 (L) 12/31/2020    Lab Results  Component Value Date   ALT 16 12/02/2020   AST 41 12/09/2020   ALKPHOS 110 12/22/2020     Microbiology: Recent Results (from the past 240 hour(s))  Culture, blood (Routine x 2)     Status: Abnormal   Collection Time: 12/23/2020 12:33 AM   Specimen: BLOOD  Result Value Ref Range Status   Specimen Description BLOOD SITE NOT SPECIFIED  Final   Special Requests   Final    BOTTLES DRAWN AEROBIC AND ANAEROBIC Blood Culture adequate volume   Culture  Setup Time   Final    GRAM POSITIVE COCCI IN CLUSTERS IN BOTH AEROBIC AND ANAEROBIC BOTTLES CRITICAL VALUE NOTED.  VALUE IS CONSISTENT WITH PREVIOUSLY REPORTED AND CALLED VALUE.    Culture (A)  Final    STAPHYLOCOCCUS AUREUS SUSCEPTIBILITIES PERFORMED ON PREVIOUS CULTURE WITHIN THE LAST 5 DAYS. Performed at [facility], [address], [place], [place] [address]    Report Status 12/31/2020  FINAL  Final  Culture, blood (Routine x 2)     Status: Abnormal   Collection Time: 12/10/2020 12:33 AM   Specimen: BLOOD  Result Value Ref Range Status   Specimen Description BLOOD SITE NOT SPECIFIED  Final   Special Requests AEROBIC BOTTLE ONLY Blood Culture adequate volume  Final   Culture  Setup Time   Final    GRAM POSITIVE COCCI IN CLUSTERS AEROBIC BOTTLE ONLY CRITICAL RESULT CALLED TO, READ BACK BY AND VERIFIED WITH: PHARMD E.SINCLAIR AT 1428 ON 12/11/2020 BY T.SAAD. Performed at [facility], [address] [place], [place] [address]    Culture STAPHYLOCOCCUS AUREUS (A)  Final   Report Status 12/31/2020 FINAL  Final   Organism ID, Bacteria STAPHYLOCOCCUS AUREUS  Final      Susceptibility   Staphylococcus aureus - MIC*    CIPROFLOXACIN <=0.5 SENSITIVE Sensitive     ERYTHROMYCIN <=0.25 SENSITIVE Sensitive     GENTAMICIN <=0.5 SENSITIVE Sensitive     OXACILLIN 0.5 SENSITIVE Sensitive     TETRACYCLINE <=1 SENSITIVE Sensitive     VANCOMYCIN 2 SENSITIVE Sensitive     TRIMETH/SULFA <=10 SENSITIVE Sensitive     CLINDAMYCIN <=0.25 SENSITIVE Sensitive     RIFAMPIN <=0.5 SENSITIVE Sensitive     Inducible Clindamycin NEGATIVE Sensitive     *  STAPHYLOCOCCUS AUREUS  Blood Culture ID Panel (Reflexed)     Status: Abnormal   Collection Time: 04-Jan-2021 12:33 AM  Result Value Ref Range Status   Enterococcus faecalis NOT DETECTED NOT DETECTED Final   Enterococcus Faecium NOT DETECTED NOT DETECTED Final   Listeria monocytogenes NOT DETECTED NOT DETECTED Final   Staphylococcus species DETECTED (A) NOT DETECTED Final    Comment: CRITICAL RESULT CALLED TO, READ BACK BY AND VERIFIED WITH: PHARMD E.SINCLAIR AT 1428 ON 01/19/2021 BY T.SAAD.    Staphylococcus aureus (BCID) DETECTED (A) NOT DETECTED Final    Comment: CRITICAL RESULT CALLED TO, READ BACK BY AND VERIFIED WITH: PHARMD E.SINCLAIR AT 1428 ON 01/22/2021 BY T.SAAD.    Staphylococcus epidermidis NOT DETECTED NOT DETECTED Final    Staphylococcus lugdunensis NOT DETECTED NOT DETECTED Final   Streptococcus species NOT DETECTED NOT DETECTED Final   Streptococcus agalactiae NOT DETECTED NOT DETECTED Final   Streptococcus pneumoniae NOT DETECTED NOT DETECTED Final   Streptococcus pyogenes NOT DETECTED NOT DETECTED Final   A.calcoaceticus-baumannii NOT DETECTED NOT DETECTED Final   Bacteroides fragilis NOT DETECTED NOT DETECTED Final   Enterobacterales NOT DETECTED NOT DETECTED Final   Enterobacter cloacae complex NOT DETECTED NOT DETECTED Final   Escherichia coli NOT DETECTED NOT DETECTED Final   Klebsiella aerogenes NOT DETECTED NOT DETECTED Final   Klebsiella oxytoca NOT DETECTED NOT DETECTED Final   Klebsiella pneumoniae NOT DETECTED NOT DETECTED Final   Proteus species NOT DETECTED NOT DETECTED Final   Salmonella species NOT DETECTED NOT DETECTED Final   Serratia marcescens NOT DETECTED NOT DETECTED Final   Haemophilus influenzae NOT DETECTED NOT DETECTED Final   Neisseria meningitidis NOT DETECTED NOT DETECTED Final   Pseudomonas aeruginosa NOT DETECTED NOT DETECTED Final   Stenotrophomonas maltophilia NOT DETECTED NOT DETECTED Final   Candida albicans NOT DETECTED NOT DETECTED Final   Candida auris NOT DETECTED NOT DETECTED Final   Candida glabrata NOT DETECTED NOT DETECTED Final   Candida krusei NOT DETECTED NOT DETECTED Final   Candida parapsilosis NOT DETECTED NOT DETECTED Final   Candida tropicalis NOT DETECTED NOT DETECTED Final   Cryptococcus neoformans/gattii NOT DETECTED NOT DETECTED Final   Meth resistant mecA/C and MREJ NOT DETECTED NOT DETECTED Final    Comment: Performed at [facility], [address] [place], [place] [address]  Culture, blood (routine x 2)     Status: None (Preliminary result)   Collection Time: 12/30/20  7:07 AM   Specimen: BLOOD LEFT HAND  Result Value Ref Range Status   Specimen Description BLOOD LEFT HAND  Final   Special Requests   Final    BOTTLES DRAWN AEROBIC  ONLY Blood Culture adequate volume   Culture   Final    NO GROWTH 2 DAYS Performed at [facility], [address] [place], [place] [address]    Report Status PENDING  Incomplete  Culture, blood (routine x 2)     Status: None (Preliminary result)   Collection Time: 12/30/20  7:07 AM   Specimen: BLOOD RIGHT HAND  Result Value Ref Range Status   Specimen Description BLOOD RIGHT HAND  Final   Special Requests   Final    BOTTLES DRAWN AEROBIC ONLY Blood Culture adequate volume   Culture   Final    NO GROWTH 2 DAYS Performed at [facility], [address] [place], [place] [address]    Report Status PENDING  Incomplete    Impression/Plan:  1. TV endocarditis -  MSSA in blood cultures again as expected and on cefazolin.  She will need a prolonged course if she stays in the hospital and need to involve cardiothoracic surgery for ? Angiovac.   Can consider a TEE if indicated by TCTS or cardiology but not necessary from ID standpoint.   2.  Back pain - can consider MRI for her back pain with concern for epidural abscess or osteomyelitis but will not change her treatment as above unless she has any neurologic deficits.    3.  Bacteremia - repeat blood cultures ngtd.    4.  Thrombocytopenia - unclear etiology.  May be from underlying sepsis, infection.  Refused labs this am so unclear if continuing to improve or not.

## 2021-01-01 NOTE — Progress Notes (Signed)
   01/01/21 0824  Assess: MEWS Score  Temp 99 F (37.2 C)  BP (!) 94/52  Pulse Rate (!) 123  ECG Heart Rate (!) 126  Resp 20  SpO2 92 %  Assess: MEWS Score  MEWS Temp 0  MEWS Systolic 1  MEWS Pulse 2  MEWS RR 0  MEWS LOC 0  MEWS Score 3  MEWS Score Color Yellow  Assess: if the MEWS score is Yellow or Red  Were vital signs taken at a resting state? Yes  Focused Assessment No change from prior assessment  Early Detection of Sepsis Score *See Row Information* Low  MEWS guidelines implemented *See Row Information* Yes  Treat  MEWS Interventions Other (Comment)  Pain Scale 0-10  Pain Score Asleep  Take Vital Signs  Increase Vital Sign Frequency  Yellow: Q 2hr X 2 then Q 4hr X 2, if remains yellow, continue Q 4hrs  Escalate  MEWS: Escalate Yellow: discuss with charge nurse/RN and consider discussing with provider and RRT  Notify: Charge Nurse/RN  Name of Charge Nurse/RN Notified Sarah RN  Date Charge Nurse/RN Notified 01/01/21  Time Charge Nurse/RN Notified 0940  Document  Patient Outcome Other (Comment)  Progress note created (see row info) Yes

## 2021-01-02 DIAGNOSIS — B9561 Methicillin susceptible Staphylococcus aureus infection as the cause of diseases classified elsewhere: Secondary | ICD-10-CM

## 2021-01-02 LAB — CBC
HCT: 25.1 % — ABNORMAL LOW (ref 36.0–46.0)
Hemoglobin: 7.6 g/dL — ABNORMAL LOW (ref 12.0–15.0)
MCH: 24.1 pg — ABNORMAL LOW (ref 26.0–34.0)
MCHC: 30.3 g/dL (ref 30.0–36.0)
MCV: 79.7 fL — ABNORMAL LOW (ref 80.0–100.0)
Platelets: 150 10*3/uL (ref 150–400)
RBC: 3.15 MIL/uL — ABNORMAL LOW (ref 3.87–5.11)
RDW: 21 % — ABNORMAL HIGH (ref 11.5–15.5)
WBC: 16.8 10*3/uL — ABNORMAL HIGH (ref 4.0–10.5)
nRBC: 0.1 % (ref 0.0–0.2)

## 2021-01-02 LAB — BASIC METABOLIC PANEL
Anion gap: 10 (ref 5–15)
BUN: 6 mg/dL (ref 6–20)
CO2: 22 mmol/L (ref 22–32)
Calcium: 7.6 mg/dL — ABNORMAL LOW (ref 8.9–10.3)
Chloride: 99 mmol/L (ref 98–111)
Creatinine, Ser: 0.64 mg/dL (ref 0.44–1.00)
GFR, Estimated: >60 mL/min (ref >60)
Glucose, Bld: 90 mg/dL (ref 70–99)
Potassium: 4.4 mmol/L (ref 3.5–5.1)
Sodium: 131 mmol/L — ABNORMAL LOW (ref 135–145)

## 2021-01-02 NOTE — Progress Notes (Signed)
Previously yellow MEWS- Dr. Allena Katz notified of heart rate and stated to continue to monitor at this time as BP is stable and to continue with withdrawal assessments and pain control at this time. No new orders.   01/02/21 1225  Assess: MEWS Score  Temp 98.8 F (37.1 C)  BP 108/78  Pulse Rate (!) 133  ECG Heart Rate (!) 132  Resp 20  SpO2 91 %  Assess: MEWS Score  MEWS Temp 0  MEWS Systolic 0  MEWS Pulse 3  MEWS RR 0  MEWS LOC 0  MEWS Score 3  MEWS Score Color Yellow  Assess: if the MEWS score is Yellow or Red  Were vital signs taken at a resting state? Yes  Focused Assessment No change from prior assessment  Early Detection of Sepsis Score *See Row Information* Low  MEWS guidelines implemented *See Row Information* No, vital signs rechecked

## 2021-01-02 NOTE — Plan of Care (Signed)
  Problem: Health Behavior/Discharge Planning: Goal: Ability to manage health-related needs will improve Outcome: Progressing   

## 2021-01-02 NOTE — Progress Notes (Addendum)
Subjective: No overnight events   This AM, patient was seen at bedside during rounds. Pt reports continued improvement in withdrawal symptoms with current methadone regimen. She does not notice much sweating, and has had no trouble sleeping. She denies SOB, palpitations, and chest pain.   Pt remains cooperative with nursing staff today.  Pt has no other complains or concerns at this time, and expresses that would like to move forward with treatment.   Objective:  Vital signs in last 24 hours: Vitals:   01/01/21 2336 01/02/21 0348 01/02/21 0831 01/02/21 1225  BP: 101/68 105/74 110/70 108/78  Pulse: (!) 109 (!) 131 (!) 123 (!) 133  Resp: 20 20 (!) 25 20  Temp: 98.1 F (36.7 C) 99.5 F (37.5 C) 99.2 F (37.3 C) 98.8 F (37.1 C)  TempSrc: Oral Oral Oral Oral  SpO2: 90% 90% 92% 91%  Weight:      Height:       Constitutional: cachectic, well appearing, alert, NAD HENT: normocephalic, atraumatic, mucous membranes moist Eyes: conjunctiva non-erythematous, extraocular movements intact Cardiovascular: tachycardic, regular rhythm, no m/r/g, non-edematous extremities Pulmonary/Chest: normal work of breathing on room air, lungs clear to auscultation bilaterally Abdominal: soft, non-tender to palpation, non-distended MSK: normal bulk and tone Neurological: alert & oriented x 3, converses appropriately  Skin: warm and dry, diaphoresis not noticed today   Psych: normal behavior, normal affect   Assessment/Plan:  Active Problems:   Acute infective endocarditis   MSSA bacteremia   Severe opioid use disorder (HCC)   Bacteremia   Pressure injury of skin   Opioid use with withdrawal (HCC)  Kathryn Medina is a 30 y.o. with a pertinent PMH of polysubstance use and IV drug use, MSSA infective endocarditis of tricuspid valve, DDD with lumbar radiculopathy, and hepatitis C admitted for suspected MSSA bacteremia in the setting of known tricuspid valve endocarditis.    Tricuspid Valve  Infective Endocarditis with MSSA bacteremia, concern for sepsis Patient is presenting with similar complaints from last several admissions likely due to incompletely treated endocarditis given patient frequently leaves AMA. Blood cultures on admission demonstrate MSSA bacteremia. Repeat cultures from 10/1 with NGTD. TTE significant for a large vegetation 2.42 x 2.5 cm and moderate regurgitation. Will consider an MRI spine in future if pt agreeable or complains of back pain. Currently, pt denies any pain. Pt remains afebrile and notices improvement in withdrawal symptoms. - CT surgery following, plan for Angiovac tomorrow.  - ID following, with recommendation to hold PICC line until after Angiovac  - Continue cefazolin 2g every 8 hours, day 6  - Follow-up repeat blood cultures. - Continuous cardiac monitoring   Opioid use disorder Polysubstance use  Patient reports last drug use 9/29. Improved withdrawal symptoms with current methadone regimen.  -methadone 10 mg tid -COWS monitoring -Toradol 30 mg IV TID as needed for pain -Narcan PRN -Miralax PRN -Nicotine patch -monitor for signs of acute intoxication  -TOC consulted   Hyponatremia Improved from 129 to 131 yesterday, as pt reports improvement in PO intake.  -Encourage PO intake   History of Septic pulmonary emboli New Left Lung Opacities  Tachycardia Patient has had prior septic pulmonary emboli. CXR showing new mid lung opacities concerning for atelectasis vs infection. Maintaining oxygen saturations this morning on RA, no crackles on lung exam. Remains tachycardic (120s) although this may be related to opioid withdrawal. Denies any respiratory symptoms and is afebrile.  - HOLD Xarelto 15 mg, Angiovac scheduled for tomorrow    Malnutrition Patient initially  had decreased PO intake likely secondary to substance use combined with infection, but has improved PO intake now.   -Continue encouraging PO intake   Microcytic anemia Hx  IDA S/p 1 unit of pRBCs on 9/30. No signs of bleeding. Hgb stable downtrending 7.6 -> 7.0  - CBC - transfuse <7   Thrombocytopenia Pt has new onset thrombocytopenia, platelets were 351 on last discharge, arrival platelets are 131. May be from underlying infection. No signs of bleeding. Platelet count improving 150 yesterday -> 183 today.     Best Practice: Diet: Normal IVF: None,None VTE: Xarelto Code: Full  Signature: [facility], MD  Internal Medicine Resident, PGY-1 [facility]  Pager: [phone] After 5pm on weekdays and 1pm on weekends: On Call pager [phone]

## 2021-01-02 NOTE — Progress Notes (Signed)
Provider aware of heart rate, no further intervention at this time   01/02/21 0831  Assess: MEWS Score  Temp 99.2 F (37.3 C)  BP 110/70  Pulse Rate (!) 123  ECG Heart Rate (!) 124  Resp (!) 25  SpO2 92 %  Assess: MEWS Score  MEWS Temp 0  MEWS Systolic 0  MEWS Pulse 2  MEWS RR 1  MEWS LOC 0  MEWS Score 3  MEWS Score Color Yellow  Assess: if the MEWS score is Yellow or Red  Were vital signs taken at a resting state? Yes  Focused Assessment No change from prior assessment  Early Detection of Sepsis Score *See Row Information* Low  MEWS guidelines implemented *See Row Information* No, vital signs rechecked

## 2021-01-03 DIAGNOSIS — I361 Nonrheumatic tricuspid (valve) insufficiency: Secondary | ICD-10-CM

## 2021-01-03 DIAGNOSIS — I339 Acute and subacute endocarditis, unspecified: Secondary | ICD-10-CM

## 2021-01-03 LAB — CBC
HCT: 22.4 % — ABNORMAL LOW (ref 36.0–46.0)
Hemoglobin: 7 g/dL — ABNORMAL LOW (ref 12.0–15.0)
MCH: 24.7 pg — ABNORMAL LOW (ref 26.0–34.0)
MCHC: 31.3 g/dL (ref 30.0–36.0)
MCV: 79.2 fL — ABNORMAL LOW (ref 80.0–100.0)
Platelets: 183 10*3/uL (ref 150–400)
RBC: 2.83 MIL/uL — ABNORMAL LOW (ref 3.87–5.11)
RDW: 22 % — ABNORMAL HIGH (ref 11.5–15.5)
WBC: 15.2 10*3/uL — ABNORMAL HIGH (ref 4.0–10.5)
nRBC: 0 % (ref 0.0–0.2)

## 2021-01-03 NOTE — TOC Initial Note (Addendum)
Transition of Care [facility]) - Initial/Assessment Note    Patient Details  Name: Kathryn Medina MRN: [id number] Date of Birth: April 16, 1990  [facility]) CM/SW Contact:    Lawerance Sabal, RN Phone Number: 01/03/2021, 10:53 AM  Clinical Narrative:     Patient admitted with TV endocarditis related to  MSSA bacteremia, long history of polysubstance abuse.  Plan for patient to remain in hospital for IV Abx, unless she decides to leave AMA which she has done in the past.               Expected Discharge Plan: Home/Self Care Barriers to Discharge: Continued Medical Work up, Inadequate or no insurance, Architect   Patient Goals and CMS Choice        Expected Discharge Plan and Services Expected Discharge Plan: Home/Self Care                                              Prior Living Arrangements/Services                       Activities of Daily Living Home Assistive Devices/Equipment: None ADL Screening (condition at time of admission) Patient's cognitive ability adequate to safely complete daily activities?: Yes Is the patient deaf or have difficulty hearing?: No Does the patient have difficulty seeing, even when wearing glasses/contacts?: No Does the patient have difficulty concentrating, remembering, or making decisions?: No Patient able to express need for assistance with ADLs?: Yes Does the patient have difficulty dressing or bathing?: No Independently performs ADLs?: No Communication: Independent Dressing (OT): Needs assistance Is this a change from baseline?: Pre-admission baseline Grooming: Needs assistance Is this a change from baseline?: Pre-admission baseline Feeding: Independent Bathing: Needs assistance Is this a change from baseline?: Pre-admission baseline Toileting: Needs assistance Is this a change from baseline?: Pre-admission baseline In/Out Bed: Needs assistance Is this a change from baseline?: Pre-admission  baseline Walks in Home: Needs assistance Is this a change from baseline?: Pre-admission baseline Does the patient have difficulty walking or climbing stairs?: Yes Weakness of Legs: Both Weakness of Arms/Hands: Both  Permission Sought/Granted                  Emotional Assessment              Admission diagnosis:  Bacteremia [R78.81] Patient Active Problem List   Diagnosis Date Noted   Opioid use with withdrawal (HCC) 01/01/2021   Pressure injury of skin 12/30/2020   Bacteremia 12/19/2020   Severe opioid use disorder (HCC)    MSSA bacteremia 11/23/2020   Septic pulmonary embolism (HCC) 11/23/2020   Infective endocarditis 11/21/2020   Acute infective endocarditis 11/19/2020   COVID-19    Hypokalemia    Polysubstance abuse (HCC)    Sepsis (HCC)    Opioid dependence with opioid-induced mood disorder (HCC)    Alcohol-induced mood disorder (HCC)    Non-reassuring fetal heart tones complicating pregnancy, antepartum 08/27/2016   Back pain affecting pregnancy 04/16/2016   Drug abuse during pregnancy (HCC) 03/19/2016   Supervision of normal first pregnancy 01/23/2016   PCP:  Pcp, No Pharmacy:   [facility] [address] [place] - [address] [place] [address] Phone: [phone] Fax: [phone]  [facility] [address] [place] - [address]  [place] [address] Phone: [phone] Fax: [phone]  [facility] [address] [place] [address] Phone: [phone] Fax: [phone]     Social Determinants of Health (SDOH) Interventions    Readmission Risk Interventions No flowsheet data found.

## 2021-01-03 NOTE — Consult Note (Addendum)
[address]       [place] [address]             [phone]        [facility] Medical Record #[id number] Date of Birth: Oct 04, 1990  Referring: Dr. Luciana Axe, MD Primary Care: Pcp, No Primary Cardiologist:None  Chief Complaint:    Chief Complaint  Patient presents with   Blood Infection  Reason for consultation: TV endocarditis  History of Present Illness:     This is a 29 year female, known to Dr.Cerria Randhawa from previous consultation on 11/24/2020 for MSSA bacteremia, TV endocarditis. Patient has presented to ED and left AMA on multiple occasions. Of note, her first presentation was on 11/19/2020 where she was found to have MSSA bacteremia, septic pulmonary emboli, and known TV endocarditis. She was put on Vancomycin, Cefepime, and Flagyl and was given fluids. She left AMA before an echo was able to be done. She then presented on 11/21/2020 with complaints of shortness of breath and chest pain. She also had complaints of diffuse body aches and lower back pain (lumbar radiculopathy).  She was put on Lovenox then Xarelto for pulmonary emboli with multiple partial cavitary nodules on CTA. She was put on Ancef for MSSA bactermia. Her past medical history is significant for IVDU, OUD, lumbar radiculopathy, COVID 10/2020,Hepatitis C. A TEE was performed by Dr. Dulce Sellar on 08/26 and this reconfirmed a large tricuspid valve vegetation and severe tricuspid regurgitation.  There was no vegetation on the mitral valve, although there is mild to moderate central mitral insufficiency. Dr. Cliffton Asters was consulted for the consideration of an Angio vac. Unfortunately, patient again left AMA on 11/25/2020. She presented again on 11/30/2020 with complaints of chest pain and shortness of breath but again left AMA on 12/01/2020.  Most recently, she presented to the ED on 12/14/2020 with complaints of delirium, weakness, shortness of breath. She was given fluids for low BP, was  continued on Xarelto for septic pulmonary emboli, WBC was 17,000, and blood cultures were obtained and showed Staph Aureus. She was put on Cefazolin. Echo done 10/02 showed LVEF 60-65%, no MS or MR, very large shaggy highly mobile density on the TV (measuring 2.42 x 2.5 cm). Dr. Cliffton Asters has been consulted again for the consideration of an Angio Vac. Of note, patient states she will NOT leave AMA and is agreeable to procedure.  Current Activity/ Functional Status: Patient is independent with mobility/ambulation, transfers, ADL's, IADL's.   Zubrod Score: At the time of surgery this patient's most appropriate activity status/level should be described as: []     0    Normal activity, no symptoms [x]     1    Restricted in physical strenuous activity but ambulatory, able to do out light work []     2    Ambulatory and capable of self care, unable to do work activities, up and about more than 50%  Of the time                            []     3    Only limited self care, in bed greater than 50% of waking hours []     4    Completely disabled, no self care, confined to bed or chair []     5    Moribund  Past Medical History:  Diagnosis Date   Asthma    does not use inhaler  Hepatitis     Past Surgical History:  Procedure Laterality Date   DILATION AND EVACUATION  03/22/2012   Procedure: DILATATION AND EVACUATION;  Surgeon: Brock Bad, MD;  Location: [facility];  Service: Gynecology;  Laterality: N/A;   TEE WITHOUT CARDIOVERSION N/A 11/24/2020   Procedure: TRANSESOPHAGEAL ECHOCARDIOGRAM (TEE);  Surgeon: Thurmon Fair, MD;  Location: [facility];  Service: Cardiovascular;  Laterality: N/A;    Social History   Tobacco Use  Smoking Status Every Day   Packs/day: 0.50   Years: 10.00   Pack years: 5.00   Types: Cigarettes  Smokeless Tobacco Never  Tobacco Comments   pt is trying to use the electronic cigarette    Social History   Substance and Sexual Activity  Alcohol Use Yes    Comment: none in a long time  1 month     Allergies  Allergen Reactions   Other Hives    pork    Current Facility-Administered Medications  Medication Dose Route Frequency Provider Last Rate Last Admin   acetaminophen (TYLENOL) tablet 650 mg  650 mg Oral Q6H PRN Merrilyn Puma, MD   650 mg at 12/31/20 0919   ceFAZolin (ANCEF) IVPB 2g/100 mL premix  2 g Intravenous Q8H Gardiner Barefoot, MD 200 mL/hr at 01/03/21 0818 2 g at 01/03/21 0818   methadone (DOLOPHINE) tablet 10 mg  10 mg Oral TID Carmel Sacramento, MD   10 mg at 01/03/21 0820   naloxone (NARCAN) injection 0.4 mg  0.4 mg Intravenous PRN Merrilyn Puma, MD       nicotine (NICODERM CQ - dosed in mg/24 hours) patch 14 mg  14 mg Transdermal Daily Merrilyn Puma, MD       polyethylene glycol (MIRALAX / GLYCOLAX) packet 17 g  17 g Oral Daily PRN Merrilyn Puma, MD       Rivaroxaban Carlena Hurl) tablet 15 mg  15 mg Oral BID Merrilyn Puma, MD   15 mg at 01/03/21 0819   [START ON 01/26/2021] rivaroxaban (XARELTO) tablet 20 mg  20 mg Oral Q supper Demaio, Alexa, MD        Medications Prior to Admission  Medication Sig Dispense Refill Last Dose   acetaminophen (TYLENOL) 500 MG tablet Take 1,000 mg by mouth every 6 (six) hours as needed for mild pain.   Past Month   Aspirin-Salicylamide-Caffeine (BC FAST PAIN RELIEF) 650-195-33.3 MG PACK Take 1 Package by mouth daily as needed (pain).   Past Month   cephALEXin (KEFLEX) 500 MG capsule Take 1 capsule (500 mg total) by mouth 4 (four) times daily. 240 capsule 0 12/28/2020   ibuprofen (ADVIL) 200 MG tablet Take 200-800 mg by mouth every 6 (six) hours as needed for headache or moderate pain.   Past Week   RIVAROXABAN (XARELTO) VTE STARTER PACK (15 & 20 MG) Follow package directions: Take one 15mg  tablet by mouth twice a day. On day 22, switch to one 20mg  tablet once a day. Take with food. 51 each 0 12/28/2020 at 2100    Family History  Problem Relation Age of Onset   Cancer Paternal Grandfather    Cancer  Paternal Grandmother    Emphysema Maternal Grandmother    Hypertension Father    Cancer Father    Review of Systems:     Cardiac Review of Systems: Y or  [  N  ]= no  Chest Pain [ On previous admission ]  Exertional SOB  [ Previously ]  Syncope  [ N ]  Presyncope [  N  ]  General Review of Systems: [Y] = yes [ N ]=no Constitional: fatigue [Y  ]; nausea [ N ]; night sweats [  ]; fever [ N ]; or chills Previously]  [                                                               Eye:  Amaurosis fugax[  N]; Resp: cough [ N ];  wheezing[  N];  hemoptysis[  N];  GI:  vomiting[N  ];  melena[ N];  hematochezia [ N]; heartburn[  ];    GU: hematuria[ N ];                Skin: Peripheral edema[N  ]; Multiple tattoos arms,feet Musculosketetal: back pain[ N ];  Heme/Lymph: anemia[ Y ];  Neuro: TIA[  N];   strokeN[  ];  seizures[ N ];     Endocrine: diabetes[ N ];  thyroid dysfunction[  N];                Physical Exam: BP 109/71 (BP Location: Right Arm)   Pulse (!) 128   Temp 98.6 F (37 C) (Tympanic)   Resp (!) 22   Ht 5\' 7"  (1.702 m)   Wt 63.1 kg   SpO2 94%   BMI 21.79 kg/m    General appearance: alert, cooperative, and no distress Head: Normocephalic, without obvious abnormality, atraumatic Resp: clear to auscultation bilaterally Cardio: Tachycardic, no murmur GI: Soft, non tender, bowel sounds present Extremities: No LE edema Neurologic: Grossly normal  Diagnostic Studies & Laboratory data:     Recent Radiology Findings:   No results found.   I have independently reviewed the above radiologic studies and discussed with the patient   Recent Lab Findings: Lab Results  Component Value Date   WBC 15.2 (H) 01/03/2021   HGB 7.0 (L) 01/03/2021   HCT 22.4 (L) 01/03/2021   PLT 183 01/03/2021   GLUCOSE 90 01/02/2021   ALT 16 10-Jan-2021   AST 41 01/10/2021   NA 131 (L) 01/02/2021   K 4.4 01/02/2021   CL 99 01/02/2021   CREATININE 0.64 01/02/2021   BUN 6 01/02/2021   CO2  22 01/02/2021   TSH 1.030 02/20/2016   INR 1.5 (H) 01/10/21   HGBA1C 4.9 02/20/2016   Assessment / Plan:    1. Tricuspid valve endocarditis-will discuss with Dr. 02/22/2016 if needs TE but unklikely. Needs Angiovac, if patient remains in hospital and does not leave AMA.  2. ID-on Cefazolin for MSSA bacteremia, endocarditis 3. History of IVDU-Fentanyl, Heroin, Methamphetamine, and Cocaine 4. History of OUD-on Methadone 5. Septic emboli-on Rivaroxaban 6. History of Hepatitis C 7. Tobacco abuse-on Nicotine patch  I  spent 10 minutes counseling the patient face to face.   Cliffton Asters PA-C 01/03/2021 9:30 AM    Agree with above 30 yo female with chronic tricuspid valve regurgitation.  She has left AMA previously on 2 occasions.  On review of the echo, she has a large sessile vegetation.  Complete removal will be difficult, but we can potentially debulk the mass to add with clearance if she completes her medical therapy course.  Will plan for angiovac debridement tomorrow.  Luvern Mischke 26

## 2021-01-03 NOTE — Progress Notes (Addendum)
Subjective: No overnight events.  This afternoon, patient was seen at bedside s/p Angiovac. She is sleepy post anesthesia, and does not converse.   Pt remains cooperative with nursing staff today.  Pt complains or concerns at this time.  Objective:  Vital signs in last 24 hours: Vitals:   01/03/21 0342 01/03/21 0812 01/03/21 1106 01/03/21 1505  BP: 91/61 109/71 107/81 103/75  Pulse: (!) 123 (!) 128 (!) 114 (!) 122  Resp: 20 (!) 22 18 19   Temp: 98.7 F (37.1 C) 98.6 F (37 C) 98.3 F (36.8 C) 98.5 F (36.9 C)  TempSrc: Oral Tympanic Oral Oral  SpO2: 93% 94% 94% 91%  Weight:      Height:       Constitutional: sleeping, well appearing, NAD HENT: normocephalic, atraumatic, mucous membranes moist Eyes: conjunctiva non-erythematous, extraocular movements intact Cardiovascular: tachycardic, regular rhythm, no m/r/g, non-edematous extremities Pulmonary/Chest: normal work of breathing on room air, lungs clear to auscultation bilaterally Abdominal: soft, non-tender to palpation, non-distended MSK: normal bulk and tone Neurological: alert & oriented x 3, converses appropriately  Skin: warm and dry, diaphoresis not noticed today   Psych: normal behavior, normal affect   Assessment/Plan:  Active Problems:   Acute infective endocarditis   MSSA bacteremia   Severe opioid use disorder (HCC)   Bacteremia   Pressure injury of skin   Opioid use with withdrawal (HCC)  Kathryn Medina is a 30 y.o. with a pertinent PMH of polysubstance use and IV drug use, MSSA infective endocarditis of tricuspid valve, DDD with lumbar radiculopathy, and hepatitis C admitted for suspected MSSA bacteremia in the setting of known tricuspid valve endocarditis.    Tricuspid Valve Infective Endocarditis with MSSA bacteremia, concern for sepsis Repeat cultures from 10/1 with NGTD. TTE significant for a large vegetation 2.42 x 2.5 cm and moderate regurgitation, and is s/p Angiovac debridement of the  tricuspid valve performed by Dr. 26 this AM: good debulking of the tricuspid valve, with residual vegetation not debrided. Pt remains afebrile.  - F/u on CT surgery recommendations.  - ID following: plan for PICC line once >48 hours post surgery.  - Continue cefazolin 2g every 8 hours, day 7 - Follow-up repeat blood cultures. - Continuous cardiac monitoring - Will consider MRI spine if pt complains of back pain, and is agreeable to imaging.    Opioid use disorder Polysubstance use  Patient reports last drug use 9/29. Improved withdrawal symptoms with current methadone regimen.  -methadone 10 mg tid -COWS monitoring -Toradol 30 mg IV TID as needed for pain -Narcan PRN -Miralax PRN -Nicotine patch -monitor for signs of acute intoxication  -TOC consulted -Set pt up at methadone clinic when appropriate   Hyponatremia Improved from 129 to 131 once pt increased PO intake. -Encourage PO intake   History of Septic pulmonary emboli New Left Lung Opacities  Tachycardia Patient has had prior septic pulmonary emboli. CXR showing new mid lung opacities concerning for atelectasis vs infection. Maintains oxygen saturations on RA, no crackles on lung exam. Remains tachycardic (120s) although this may be related to opioid withdrawal. Denies any respiratory symptoms and is afebrile.  - HOLDING Xarelto 15 mg until 10/8, per CT surgery     Malnutrition Patient initially had decreased PO intake likely secondary to substance use combined with infection, but has improved PO intake now.   -Continue encouraging PO intake   Microcytic anemia Hx IDA S/p 1 unit of pRBCs on 9/30. No signs of bleeding. Hgb stable 7.2  -  CBC - transfuse <7   Thrombocytopenia Pt has new onset thrombocytopenia, platelets were 351 on last discharge, arrival platelets are 131. May be from underlying infection. No signs of bleeding. Platelet count improving 183 yesterday -> 190 today.     Best Practice: Diet:  Normal IVF: None,None VTE: Xarelto Code: Full  Signature: Carmel Sacramento, MD  Internal Medicine Resident, PGY-1 [facility]  Pager: [phone] After 5pm on weekdays and 1pm on weekends: On Call pager [phone]

## 2021-01-03 NOTE — Progress Notes (Signed)
[facility]  Date of Admission:  12/11/2020      Total days of antibiotics 5   Cefazolin          ASSESSMENT: Kathryn Medina is a 30 y.o. female with chronic tricuspid valve endocarditis, now with larger vegetation in the setting of patient interrupted therapy. MSSA in blood again c/w her past infection.   TV endocarditis - angiovac procedure scheduled tomorrow with Dr. Cliffton Asters.  Bacteremia - cleared 10/01. Temps ~ 99. Would consider PICC after angiovac procedure (have seen where it can cause transient recurrent bacteremia, would wait 48h after procedure ideally).    Thrombocytopenia - improving as we treat infection.   Back pain - seems resolved now that opioid withdrawal is managed.   Opoid Use Disorder - appreciate cares from primary team to manage this and keep her here on treatment.     PLAN: Continue cefazolin Hold on PICC line for now until after Angiovac     Active Problems:   Acute infective endocarditis   MSSA bacteremia   Severe opioid use disorder (HCC)   Bacteremia   Pressure injury of skin   Opioid use with withdrawal (HCC)    methadone  10 mg Oral TID   nicotine  14 mg Transdermal Daily   rivaroxaban  15 mg Oral BID   [START ON 01/03/2021] rivaroxaban  20 mg Oral Q supper    SUBJECTIVE: Withdrawal well controlled. No new complaints or concerns today.  Surgery has seen her and planning angiovac procedure.    Review of Systems: Review of Systems  Constitutional:  Negative for chills, fever, malaise/fatigue and weight loss.  HENT:  Negative for sore throat.        No dental problems  Respiratory:  Positive for cough. Negative for sputum production.   Cardiovascular:  Negative for chest pain and leg swelling.  Gastrointestinal:  Negative for abdominal pain, diarrhea and vomiting.  Genitourinary:  Negative for dysuria and flank pain.  Musculoskeletal:  Negative for joint pain, myalgias and neck pain.   Skin:  Negative for rash.  Neurological:  Negative for dizziness, tingling and headaches.  Psychiatric/Behavioral:  Negative for depression and substance abuse. The patient is not nervous/anxious and does not have insomnia.    Allergies  Allergen Reactions   Other Hives    pork    OBJECTIVE: Vitals:   01/02/21 2355 01/03/21 0342 01/03/21 0812 01/03/21 1106  BP: 105/73 91/61 109/71 107/81  Pulse: (!) 139 (!) 123 (!) 128 (!) 114  Resp: 20 20 (!) 22 18  Temp: 99.5 F (37.5 C) 98.7 F (37.1 C) 98.6 F (37 C) 98.3 F (36.8 C)  TempSrc: Oral Oral Tympanic Oral  SpO2: 94% 93% 94% 94%  Weight:      Height:       Body mass index is 21.79 kg/m.  Physical Exam Vitals reviewed.  Constitutional:      Appearance: She is well-developed.     Comments: Resting comfortably in bed. No distress.   HENT:     Mouth/Throat:     Mouth: No oral lesions.     Dentition: Normal dentition. No dental abscesses.     Pharynx: No oropharyngeal exudate.  Cardiovascular:     Rate and Rhythm: Regular rhythm. Tachycardia present.     Heart sounds: Murmur heard.  Pulmonary:     Effort: Pulmonary effort is normal.     Breath sounds: Normal breath sounds.  Abdominal:  General: There is no distension.     Palpations: Abdomen is soft.     Tenderness: There is no abdominal tenderness.  Lymphadenopathy:     Cervical: No cervical adenopathy.  Skin:    General: Skin is warm and dry.     Findings: No rash.  Neurological:     Mental Status: She is alert and oriented to person, place, and time.  Psychiatric:        Judgment: Judgment normal.    Lab Results Lab Results  Component Value Date   WBC 15.2 (H) 01/03/2021   HGB 7.0 (L) 01/03/2021   HCT 22.4 (L) 01/03/2021   MCV 79.2 (L) 01/03/2021   PLT 183 01/03/2021    Lab Results  Component Value Date   CREATININE 0.64 01/02/2021   BUN 6 01/02/2021   NA 131 (L) 01/02/2021   K 4.4 01/02/2021   CL 99 01/02/2021   CO2 22 01/02/2021    Lab  Results  Component Value Date   ALT 16 12/25/2020   AST 41 12/21/2020   ALKPHOS 110 12/06/2020   BILITOT 1.0 12/09/2020     Microbiology: Recent Results (from the past 240 hour(s))  Culture, blood (Routine x 2)     Status: Abnormal   Collection Time: 12/09/2020 12:33 AM   Specimen: BLOOD  Result Value Ref Range Status   Specimen Description BLOOD SITE NOT SPECIFIED  Final   Special Requests   Final    BOTTLES DRAWN AEROBIC AND ANAEROBIC Blood Culture adequate volume   Culture  Setup Time   Final    GRAM POSITIVE COCCI IN CLUSTERS IN BOTH AEROBIC AND ANAEROBIC BOTTLES CRITICAL VALUE NOTED.  VALUE IS CONSISTENT WITH PREVIOUSLY REPORTED AND CALLED VALUE.    Culture (A)  Final    STAPHYLOCOCCUS AUREUS SUSCEPTIBILITIES PERFORMED ON PREVIOUS CULTURE WITHIN THE LAST 5 DAYS. Performed at [facility], [address], [place], [place] [address]    Report Status 12/31/2020 FINAL  Final  Culture, blood (Routine x 2)     Status: Abnormal   Collection Time: 12/27/2020 12:33 AM   Specimen: BLOOD  Result Value Ref Range Status   Specimen Description BLOOD SITE NOT SPECIFIED  Final   Special Requests AEROBIC BOTTLE ONLY Blood Culture adequate volume  Final   Culture  Setup Time   Final    GRAM POSITIVE COCCI IN CLUSTERS AEROBIC BOTTLE ONLY CRITICAL RESULT CALLED TO, READ BACK BY AND VERIFIED WITH: PHARMD E.SINCLAIR AT 1428 ON 12/26/2020 BY T.SAAD. Performed at [facility], [address] [place], [place] [address]    Culture STAPHYLOCOCCUS AUREUS (A)  Final   Report Status 12/31/2020 FINAL  Final   Organism ID, Bacteria STAPHYLOCOCCUS AUREUS  Final      Susceptibility   Staphylococcus aureus - MIC*    CIPROFLOXACIN <=0.5 SENSITIVE Sensitive     ERYTHROMYCIN <=0.25 SENSITIVE Sensitive     GENTAMICIN <=0.5 SENSITIVE Sensitive     OXACILLIN 0.5 SENSITIVE Sensitive     TETRACYCLINE <=1 SENSITIVE Sensitive     VANCOMYCIN 2 SENSITIVE Sensitive     TRIMETH/SULFA <=10 SENSITIVE  Sensitive     CLINDAMYCIN <=0.25 SENSITIVE Sensitive     RIFAMPIN <=0.5 SENSITIVE Sensitive     Inducible Clindamycin NEGATIVE Sensitive     * STAPHYLOCOCCUS AUREUS  Blood Culture ID Panel (Reflexed)     Status: Abnormal   Collection Time: 12/17/2020 12:33 AM  Result Value Ref Range Status   Enterococcus faecalis NOT DETECTED  NOT DETECTED Final   Enterococcus Faecium NOT DETECTED NOT DETECTED Final   Listeria monocytogenes NOT DETECTED NOT DETECTED Final   Staphylococcus species DETECTED (A) NOT DETECTED Final    Comment: CRITICAL RESULT CALLED TO, READ BACK BY AND VERIFIED WITH: PHARMD E.SINCLAIR AT 1428 ON 01-06-21 BY T.SAAD.    Staphylococcus aureus (BCID) DETECTED (A) NOT DETECTED Final    Comment: CRITICAL RESULT CALLED TO, READ BACK BY AND VERIFIED WITH: PHARMD E.SINCLAIR AT 1428 ON 01-06-2021 BY T.SAAD.    Staphylococcus epidermidis NOT DETECTED NOT DETECTED Final   Staphylococcus lugdunensis NOT DETECTED NOT DETECTED Final   Streptococcus species NOT DETECTED NOT DETECTED Final   Streptococcus agalactiae NOT DETECTED NOT DETECTED Final   Streptococcus pneumoniae NOT DETECTED NOT DETECTED Final   Streptococcus pyogenes NOT DETECTED NOT DETECTED Final   A.calcoaceticus-baumannii NOT DETECTED NOT DETECTED Final   Bacteroides fragilis NOT DETECTED NOT DETECTED Final   Enterobacterales NOT DETECTED NOT DETECTED Final   Enterobacter cloacae complex NOT DETECTED NOT DETECTED Final   Escherichia coli NOT DETECTED NOT DETECTED Final   Klebsiella aerogenes NOT DETECTED NOT DETECTED Final   Klebsiella oxytoca NOT DETECTED NOT DETECTED Final   Klebsiella pneumoniae NOT DETECTED NOT DETECTED Final   Proteus species NOT DETECTED NOT DETECTED Final   Salmonella species NOT DETECTED NOT DETECTED Final   Serratia marcescens NOT DETECTED NOT DETECTED Final   Haemophilus influenzae NOT DETECTED NOT DETECTED Final   Neisseria meningitidis NOT DETECTED NOT DETECTED Final   Pseudomonas  aeruginosa NOT DETECTED NOT DETECTED Final   Stenotrophomonas maltophilia NOT DETECTED NOT DETECTED Final   Candida albicans NOT DETECTED NOT DETECTED Final   Candida auris NOT DETECTED NOT DETECTED Final   Candida glabrata NOT DETECTED NOT DETECTED Final   Candida krusei NOT DETECTED NOT DETECTED Final   Candida parapsilosis NOT DETECTED NOT DETECTED Final   Candida tropicalis NOT DETECTED NOT DETECTED Final   Cryptococcus neoformans/gattii NOT DETECTED NOT DETECTED Final   Meth resistant mecA/C and MREJ NOT DETECTED NOT DETECTED Final    Comment: Performed at [facility], [address] [place], [place] [address]  Culture, blood (routine x 2)     Status: None (Preliminary result)   Collection Time: 12/30/20  7:07 AM   Specimen: BLOOD LEFT HAND  Result Value Ref Range Status   Specimen Description BLOOD LEFT HAND  Final   Special Requests   Final    BOTTLES DRAWN AEROBIC ONLY Blood Culture adequate volume   Culture   Final    NO GROWTH 4 DAYS Performed at [facility], [address] [place], [place] [address]    Report Status PENDING  Incomplete  Culture, blood (routine x 2)     Status: None (Preliminary result)   Collection Time: 12/30/20  7:07 AM   Specimen: BLOOD RIGHT HAND  Result Value Ref Range Status   Specimen Description BLOOD RIGHT HAND  Final   Special Requests   Final    BOTTLES DRAWN AEROBIC ONLY Blood Culture adequate volume   Culture   Final    NO GROWTH 4 DAYS Performed at [facility], [address], [place], [place] [address]    Report Status PENDING  Incomplete    Rexene Alberts, MSN, NP-C [facility]  [email or web address] Pager: [phone] Office: [phone] [facility] Main Line: [phone]

## 2021-01-04 ENCOUNTER — Inpatient Hospital Stay (HOSPITAL_COMMUNITY): Payer: Medicaid Other

## 2021-01-04 ENCOUNTER — Encounter (HOSPITAL_COMMUNITY): Admission: EM | Disposition: E | Payer: Self-pay | Source: Home / Self Care | Attending: Internal Medicine

## 2021-01-04 ENCOUNTER — Inpatient Hospital Stay (HOSPITAL_COMMUNITY): Payer: Medicaid Other | Admitting: Certified Registered"

## 2021-01-04 DIAGNOSIS — I339 Acute and subacute endocarditis, unspecified: Secondary | ICD-10-CM

## 2021-01-04 DIAGNOSIS — I63232 Cerebral infarction due to unspecified occlusion or stenosis of left carotid arteries: Secondary | ICD-10-CM

## 2021-01-04 DIAGNOSIS — I330 Acute and subacute infective endocarditis: Secondary | ICD-10-CM

## 2021-01-04 DIAGNOSIS — I361 Nonrheumatic tricuspid (valve) insufficiency: Secondary | ICD-10-CM

## 2021-01-04 HISTORY — PX: APPLICATION OF ANGIOVAC: SHX6777

## 2021-01-04 HISTORY — PX: TEE WITHOUT CARDIOVERSION: SHX5443

## 2021-01-04 LAB — CBC
HCT: 23.9 % — ABNORMAL LOW (ref 36.0–46.0)
HCT: 25.1 % — ABNORMAL LOW (ref 36.0–46.0)
Hemoglobin: 7.2 g/dL — ABNORMAL LOW (ref 12.0–15.0)
Hemoglobin: 7.5 g/dL — ABNORMAL LOW (ref 12.0–15.0)
MCH: 24.2 pg — ABNORMAL LOW (ref 26.0–34.0)
MCH: 23.8 pg — ABNORMAL LOW (ref 26.0–34.0)
MCHC: 30.1 g/dL (ref 30.0–36.0)
MCHC: 29.9 g/dL — ABNORMAL LOW (ref 30.0–36.0)
MCV: 80.5 fL (ref 80.0–100.0)
MCV: 79.7 fL — ABNORMAL LOW (ref 80.0–100.0)
Platelets: 190 10*3/uL (ref 150–400)
Platelets: 208 10*3/uL (ref 150–400)
RBC: 2.97 MIL/uL — ABNORMAL LOW (ref 3.87–5.11)
RBC: 3.15 MIL/uL — ABNORMAL LOW (ref 3.87–5.11)
RDW: 22.9 % — ABNORMAL HIGH (ref 11.5–15.5)
RDW: 22.7 % — ABNORMAL HIGH (ref 11.5–15.5)
WBC: 15 10*3/uL — ABNORMAL HIGH (ref 4.0–10.5)
WBC: 21.3 10*3/uL — ABNORMAL HIGH (ref 4.0–10.5)
nRBC: 0 % (ref 0.0–0.2)
nRBC: 0 % (ref 0.0–0.2)

## 2021-01-04 LAB — COMPREHENSIVE METABOLIC PANEL
ALT: 15 U/L (ref 0–44)
AST: 33 U/L (ref 15–41)
Albumin: 1.9 g/dL — ABNORMAL LOW (ref 3.5–5.0)
Alkaline Phosphatase: 111 U/L (ref 38–126)
Anion gap: 8 (ref 5–15)
BUN: 8 mg/dL (ref 6–20)
CO2: 23 mmol/L (ref 22–32)
Calcium: 8.4 mg/dL — ABNORMAL LOW (ref 8.9–10.3)
Chloride: 102 mmol/L (ref 98–111)
Creatinine, Ser: 0.4 mg/dL — ABNORMAL LOW (ref 0.44–1.00)
GFR, Estimated: >60 mL/min (ref >60)
Glucose, Bld: 124 mg/dL — ABNORMAL HIGH (ref 70–99)
Potassium: 4.7 mmol/L (ref 3.5–5.1)
Sodium: 133 mmol/L — ABNORMAL LOW (ref 135–145)
Total Bilirubin: 0.5 mg/dL (ref 0.3–1.2)
Total Protein: 6.6 g/dL (ref 6.5–8.1)

## 2021-01-04 LAB — ECHO INTRAOPERATIVE TEE
Height: 67 in
Weight: 2225.76 oz

## 2021-01-04 LAB — BLOOD GAS, ARTERIAL
Acid-base deficit: 2.2 mmol/L — ABNORMAL HIGH (ref 0.0–2.0)
Bicarbonate: 21.3 mmol/L (ref 20.0–28.0)
Drawn by: 31394
FIO2: 28
O2 Saturation: 96.3 %
Patient temperature: 37
pCO2 arterial: 31.5 mmHg — ABNORMAL LOW (ref 32.0–48.0)
pH, Arterial: 7.444 (ref 7.350–7.450)
pO2, Arterial: 80.9 mmHg — ABNORMAL LOW (ref 83.0–108.0)

## 2021-01-04 LAB — CULTURE, BLOOD (ROUTINE X 2)
Culture: NO GROWTH
Culture: NO GROWTH
Special Requests: ADEQUATE
Special Requests: ADEQUATE

## 2021-01-04 LAB — MRSA NEXT GEN BY PCR, NASAL: MRSA by PCR Next Gen: NOT DETECTED

## 2021-01-04 SURGERY — APPLICATION OF ANGIOVAC
Anesthesia: General

## 2021-01-04 MED ORDER — VASOPRESSIN 20 UNIT/ML IV SOLN
INTRAVENOUS | Status: AC
  Filled 2021-01-04: qty 1

## 2021-01-04 MED ORDER — ROCURONIUM BROMIDE 10 MG/ML (PF) SYRINGE
PREFILLED_SYRINGE | INTRAVENOUS | Status: AC
  Filled 2021-01-04: qty 10

## 2021-01-04 MED ORDER — ONDANSETRON HCL 4 MG/2ML IJ SOLN: 4.0000 mg | Freq: Four times a day (QID) | INTRAMUSCULAR | Status: DC | PRN

## 2021-01-04 MED ORDER — ACETAMINOPHEN 10 MG/ML IV SOLN
INTRAVENOUS | Status: AC
  Filled 2021-01-04: qty 100

## 2021-01-04 MED ORDER — SODIUM CHLORIDE 3 % IV SOLN: INTRAVENOUS | Status: DC

## 2021-01-04 MED ORDER — SUGAMMADEX SODIUM 200 MG/2ML IV SOLN
INTRAVENOUS | Status: DC | PRN
  Administered 2021-01-04: 200 mg via INTRAVENOUS

## 2021-01-04 MED ORDER — GLYCOPYRROLATE PF 0.2 MG/ML IJ SOSY
PREFILLED_SYRINGE | INTRAMUSCULAR | Status: AC
  Filled 2021-01-04: qty 1

## 2021-01-04 MED ORDER — ONDANSETRON HCL 4 MG/2ML IJ SOLN: 4.0000 mg | Freq: Once | INTRAMUSCULAR | Status: DC | PRN

## 2021-01-04 MED ORDER — MIDAZOLAM HCL 2 MG/2ML IJ SOLN
0.5000 mg | INTRAMUSCULAR | Status: AC | PRN
  Administered 2021-01-04: 0.5 mg via INTRAVENOUS

## 2021-01-04 MED ORDER — ONDANSETRON HCL 4 MG/2ML IJ SOLN
INTRAMUSCULAR | Status: DC | PRN
  Administered 2021-01-04: 4 mg via INTRAVENOUS

## 2021-01-04 MED ORDER — MIDAZOLAM HCL 2 MG/2ML IJ SOLN
INTRAMUSCULAR | Status: AC
  Filled 2021-01-04: qty 4

## 2021-01-04 MED ORDER — DEXMEDETOMIDINE (PRECEDEX) IN NS 20 MCG/5ML (4 MCG/ML) IV SYRINGE
PREFILLED_SYRINGE | INTRAVENOUS | Status: DC | PRN
  Administered 2021-01-04 (×2): 8 ug via INTRAVENOUS

## 2021-01-04 MED ORDER — HEPARIN SODIUM (PORCINE) 1000 UNIT/ML IJ SOLN
INTRAMUSCULAR | Status: DC | PRN
  Administered 2021-01-04: 8000 [IU] via INTRAVENOUS

## 2021-01-04 MED ORDER — PROPOFOL 10 MG/ML IV BOLUS
INTRAVENOUS | Status: AC
  Filled 2021-01-04: qty 20

## 2021-01-04 MED ORDER — MINERAL OIL LIGHT 100 % EX OIL
TOPICAL_OIL | CUTANEOUS | Status: DC | PRN
  Administered 2021-01-04: 1 via TOPICAL

## 2021-01-04 MED ORDER — LIDOCAINE 2% (20 MG/ML) 5 ML SYRINGE
INTRAMUSCULAR | Status: AC
  Filled 2021-01-04: qty 5

## 2021-01-04 MED ORDER — ROCURONIUM BROMIDE 10 MG/ML (PF) SYRINGE
PREFILLED_SYRINGE | INTRAVENOUS | Status: DC | PRN
  Administered 2021-01-04 (×2): 50 mg via INTRAVENOUS

## 2021-01-04 MED ORDER — PROTAMINE SULFATE 10 MG/ML IV SOLN
INTRAVENOUS | Status: DC | PRN
  Administered 2021-01-04: 80 mg via INTRAVENOUS

## 2021-01-04 MED ORDER — DEXAMETHASONE SODIUM PHOSPHATE 10 MG/ML IJ SOLN
INTRAMUSCULAR | Status: AC
  Filled 2021-01-04: qty 1

## 2021-01-04 MED ORDER — HEPARIN 6000 UNIT IRRIGATION SOLUTION
Status: DC | PRN
  Administered 2021-01-04: 1

## 2021-01-04 MED ORDER — MIDAZOLAM HCL 5 MG/5ML IJ SOLN
INTRAMUSCULAR | Status: DC | PRN
  Administered 2021-01-04 (×2): 2 mg via INTRAVENOUS

## 2021-01-04 MED ORDER — DEXMEDETOMIDINE (PRECEDEX) IN NS 20 MCG/5ML (4 MCG/ML) IV SYRINGE
PREFILLED_SYRINGE | INTRAVENOUS | Status: AC
  Filled 2021-01-04: qty 5

## 2021-01-04 MED ORDER — PHENYLEPHRINE 40 MCG/ML (10ML) SYRINGE FOR IV PUSH (FOR BLOOD PRESSURE SUPPORT)
PREFILLED_SYRINGE | INTRAVENOUS | Status: DC | PRN
  Administered 2021-01-04 (×3): 80 ug via INTRAVENOUS
  Administered 2021-01-04: 120 ug via INTRAVENOUS

## 2021-01-04 MED ORDER — 0.9 % SODIUM CHLORIDE (POUR BTL) OPTIME
TOPICAL | Status: DC | PRN
  Administered 2021-01-04: 1000 mL

## 2021-01-04 MED ORDER — VASOPRESSIN 20 UNIT/ML IV SOLN
INTRAVENOUS | Status: DC | PRN
  Administered 2021-01-04 (×2): 2 [IU] via INTRAVENOUS
  Administered 2021-01-04: 1 [IU] via INTRAVENOUS
  Administered 2021-01-04: 2 [IU] via INTRAVENOUS
  Administered 2021-01-04: 1 [IU] via INTRAVENOUS
  Administered 2021-01-04: 2 [IU] via INTRAVENOUS

## 2021-01-04 MED ORDER — HEPARIN 6000 UNIT IRRIGATION SOLUTION
Status: AC
  Filled 2021-01-04: qty 500

## 2021-01-04 MED ORDER — PROPOFOL 10 MG/ML IV BOLUS
INTRAVENOUS | Status: DC | PRN
  Administered 2021-01-04: 140 mg via INTRAVENOUS

## 2021-01-04 MED ORDER — RIVAROXABAN 15 MG PO TABS: 15.0000 mg | ORAL_TABLET | Freq: Two times a day (BID) | ORAL | Status: DC

## 2021-01-04 MED ORDER — LIDOCAINE 2% (20 MG/ML) 5 ML SYRINGE
INTRAMUSCULAR | Status: DC | PRN
  Administered 2021-01-04: 60 mg via INTRAVENOUS

## 2021-01-04 MED ORDER — FENTANYL CITRATE (PF) 250 MCG/5ML IJ SOLN
INTRAMUSCULAR | Status: DC | PRN
  Administered 2021-01-04: 150 ug via INTRAVENOUS
  Administered 2021-01-04 (×2): 50 ug via INTRAVENOUS

## 2021-01-04 MED ORDER — PHENYLEPHRINE HCL-NACL 20-0.9 MG/250ML-% IV SOLN
INTRAVENOUS | Status: DC | PRN
  Administered 2021-01-04: 25 ug/min via INTRAVENOUS

## 2021-01-04 MED ORDER — SUCCINYLCHOLINE CHLORIDE 200 MG/10ML IV SOSY
PREFILLED_SYRINGE | INTRAVENOUS | Status: AC
  Filled 2021-01-04: qty 10

## 2021-01-04 MED ORDER — MIDAZOLAM HCL 2 MG/2ML IJ SOLN
INTRAMUSCULAR | Status: AC
  Administered 2021-01-04: 0.5 mg via INTRAVENOUS
  Filled 2021-01-04: qty 2

## 2021-01-04 MED ORDER — LACTATED RINGERS IV SOLN: INTRAVENOUS | Status: DC | PRN

## 2021-01-04 MED ORDER — ONDANSETRON HCL 4 MG/2ML IJ SOLN
INTRAMUSCULAR | Status: AC
  Filled 2021-01-04: qty 2

## 2021-01-04 MED ORDER — HYDROMORPHONE HCL 1 MG/ML IJ SOLN: 0.2500 mg | INTRAMUSCULAR | Status: DC | PRN

## 2021-01-04 MED ORDER — SUCCINYLCHOLINE CHLORIDE 200 MG/10ML IV SOSY
PREFILLED_SYRINGE | INTRAVENOUS | Status: DC | PRN
  Administered 2021-01-04: 100 mg via INTRAVENOUS

## 2021-01-04 MED ORDER — ACETAMINOPHEN 10 MG/ML IV SOLN
1000.0000 mg | Freq: Once | INTRAVENOUS | Status: DC | PRN
  Administered 2021-01-04: 1000 mg via INTRAVENOUS

## 2021-01-04 MED ORDER — IOHEXOL 350 MG/ML SOLN
75.0000 mL | Freq: Once | INTRAVENOUS | Status: AC | PRN
  Administered 2021-01-04: 75 mL via INTRAVENOUS

## 2021-01-04 MED ORDER — SODIUM CHLORIDE 0.9 % IV SOLN: INTRAVENOUS | Status: DC

## 2021-01-04 MED ORDER — PHENYLEPHRINE 40 MCG/ML (10ML) SYRINGE FOR IV PUSH (FOR BLOOD PRESSURE SUPPORT)
PREFILLED_SYRINGE | INTRAVENOUS | Status: AC
  Filled 2021-01-04: qty 10

## 2021-01-04 MED ORDER — DEXAMETHASONE SODIUM PHOSPHATE 10 MG/ML IJ SOLN
INTRAMUSCULAR | Status: DC | PRN
  Administered 2021-01-04: 4 mg via INTRAVENOUS

## 2021-01-04 MED ORDER — BUPIVACAINE HCL (PF) 0.5 % IJ SOLN
INTRAMUSCULAR | Status: AC
  Filled 2021-01-04: qty 30

## 2021-01-04 MED ORDER — FENTANYL CITRATE (PF) 250 MCG/5ML IJ SOLN
INTRAMUSCULAR | Status: AC
  Filled 2021-01-04: qty 5

## 2021-01-04 MED ORDER — CHLORHEXIDINE GLUCONATE CLOTH 2 % EX PADS
6.0000 | MEDICATED_PAD | Freq: Every day | CUTANEOUS | Status: DC
Start: 2021-01-04 — End: 2021-01-07
  Administered 2021-01-04 – 2021-01-06 (×3): 6 via TOPICAL

## 2021-01-04 SURGICAL SUPPLY — 65 items
ADH SKN CLS APL DERMABOND .7 (GAUZE/BANDAGES/DRESSINGS)
ANGIOVAC CIRCUIT GEN3 (MISCELLANEOUS) ×2
APL PRP STRL LF DISP 70% ISPRP (MISCELLANEOUS) ×1
BAG BANDED W/RUBBER/TAPE 36X54 (MISCELLANEOUS) IMPLANT
BAG EQP BAND 135X91 W/RBR TAPE (MISCELLANEOUS)
CANISTER SUCT 3000ML PPV (MISCELLANEOUS) ×2 IMPLANT
CANNULA ANGIOVAC F18 (CANNULA) ×2 IMPLANT
CANNULA C180 ANGIOVAC 180 DEG (CANNULA) IMPLANT
CANNULA C20 ANGIOVAC 20 DEG (CANNULA) IMPLANT
CANNULA OPTISITE PERFUSION 16F (CANNULA) IMPLANT
CANNULA OPTISITE PERFUSION 18F (CANNULA) ×2 IMPLANT
CANNULA OPTISITE PERFUSION 20F (CANNULA) IMPLANT
CATH ROBINSON RED A/P 18FR (CATHETERS) ×2 IMPLANT
CHLORAPREP W/TINT 26 (MISCELLANEOUS) ×2 IMPLANT
CLIP VESOCCLUDE MED 24/CT (CLIP) IMPLANT
CLIP VESOCCLUDE SM WIDE 24/CT (CLIP) IMPLANT
COVER PROBE W GEL 5X96 (DRAPES) ×4 IMPLANT
COVER SURGICAL LIGHT HANDLE (MISCELLANEOUS) ×2 IMPLANT
DERMABOND ADVANCED (GAUZE/BANDAGES/DRESSINGS)
DERMABOND ADVANCED .7 DNX12 (GAUZE/BANDAGES/DRESSINGS) IMPLANT
DRAPE C-ARM 42X72 X-RAY (DRAPES) IMPLANT
DRAPE INCISE IOBAN 66X45 STRL (DRAPES) ×2 IMPLANT
DRAPE INCISE IOBAN 85X60 (DRAPES) ×2 IMPLANT
DRYSEAL FLEXSHEATH 18FR 33CM (SHEATH)
DRYSEAL FLEXSHEATH 22FR 33CM (SHEATH) ×1
DRYSEAL FLEXSHEATH 26FR 33CM (SHEATH)
ELECT REM PT RETURN 9FT ADLT (ELECTROSURGICAL) ×2
ELECTRODE REM PT RTRN 9FT ADLT (ELECTROSURGICAL) ×1 IMPLANT
FELT TEFLON 1X6 (MISCELLANEOUS) ×2 IMPLANT
GAUZE 4X4 16PLY ~~LOC~~+RFID DBL (SPONGE) ×2 IMPLANT
GAUZE SPONGE 4X4 12PLY STRL (GAUZE/BANDAGES/DRESSINGS) ×4 IMPLANT
GLOVE SURG ENC MOIS LTX SZ7 (GLOVE) ×2 IMPLANT
GLOVE SURG MICRO LTX SZ6.5 (GLOVE) ×2 IMPLANT
GOWN STRL REUS W/ TWL LRG LVL3 (GOWN DISPOSABLE) ×3 IMPLANT
GOWN STRL REUS W/ TWL XL LVL3 (GOWN DISPOSABLE) ×1 IMPLANT
GOWN STRL REUS W/TWL LRG LVL3 (GOWN DISPOSABLE) ×6
GOWN STRL REUS W/TWL XL LVL3 (GOWN DISPOSABLE) ×2
KIT BASIN OR (CUSTOM PROCEDURE TRAY) ×2 IMPLANT
KIT DILATOR VASC 18G NDL (KITS) ×2 IMPLANT
KIT TURNOVER KIT B (KITS) IMPLANT
NEEDLE HYPO 25GX1X1/2 BEV (NEEDLE) IMPLANT
PACK CIRCUIT ANGIOVAC GEN3 (MISCELLANEOUS) ×1 IMPLANT
PACK ENDO MINOR (CUSTOM PROCEDURE TRAY) ×2 IMPLANT
PACK UNIVERSAL I (CUSTOM PROCEDURE TRAY) ×2 IMPLANT
PAD ARMBOARD 7.5X6 YLW CONV (MISCELLANEOUS) ×4 IMPLANT
POSITIONER HEAD DONUT 9IN (MISCELLANEOUS) ×2 IMPLANT
PUMP SARN DELFIN (MISCELLANEOUS) ×2 IMPLANT
SET MICROPUNCTURE 5F STIFF (MISCELLANEOUS) IMPLANT
SHEATH DRYSEAL FLEX 18FR 33CM (SHEATH) IMPLANT
SHEATH DRYSEAL FLEX 22FR 33CM (SHEATH) ×1 IMPLANT
SHEATH DRYSEAL FLEX 26FR 33CM (SHEATH) IMPLANT
SHEATH PINNACLE 8F 10CM (SHEATH) ×4 IMPLANT
SPONGE T-LAP 18X18 ~~LOC~~+RFID (SPONGE) ×2 IMPLANT
STOPCOCK 4 WAY LG BORE MALE ST (IV SETS) IMPLANT
SUT ETHIBOND X763 2 0 SH 1 (SUTURE) ×4 IMPLANT
SUT PROLENE 6 0 BV (SUTURE) IMPLANT
SUT SILK  1 MH (SUTURE) ×4
SUT SILK 1 MH (SUTURE) ×2 IMPLANT
SYR CONTROL 10ML LL (SYRINGE) IMPLANT
TAPE CLOTH SURG 4X10 WHT LF (GAUZE/BANDAGES/DRESSINGS) ×4 IMPLANT
TOWEL GREEN STERILE (TOWEL DISPOSABLE) ×4 IMPLANT
TRAY FOLEY SLVR 14FR TEMP STAT (SET/KITS/TRAYS/PACK) IMPLANT
TRAY FOLEY SLVR 16FR TEMP STAT (SET/KITS/TRAYS/PACK) IMPLANT
WATER STERILE IRR 1000ML POUR (IV SOLUTION) ×2 IMPLANT
WIRE AMPLATZ SS-J .035X180CM (WIRE) ×2 IMPLANT

## 2021-01-04 NOTE — Progress Notes (Signed)
No family or visitors have been here for this patient this shift. Patient room on 5W filled with food and belongings. Envelope with $4 sent to 4N with patient. This RN attempted to reach patient's boyfriend to get patient's belongings. There is no number seen for patient's boyfriend. This RN called Blondell Reveal, patient's mother. Altha Sweitzer states that doctor has not updated her yet. This RN told Mrs. Pankowski that there was a change in patient's status, patient was transferred to ICU, and to expect a phone call from the doctor. Mrs. Justiniano verbalizes understanding. Mrs. Garciaperez unaware of how to reach boyfriend and states to throw away food and place belongings in a bag/bags, and she will pick belongings up tomorrow. Burnice Logan, MD notified of Mrs. Funez being awake and ready for his phone call. MD states that he will call Mrs. Ciriello shortly.

## 2021-01-04 NOTE — Brief Op Note (Signed)
12/09/2020 - 01/26/2021  9:17 AM  PATIENT:  Kathryn Medina  30 y.o. female  PRE-OPERATIVE DIAGNOSIS:  1. MODERATE TR 2.TV ENDOCARDITIS  POST-OPERATIVE DIAGNOSIS:  1. MODERATE TR 2.TV ENDOCARDITIS  PROCEDURE:  TRANSESOPHAGEAL ECHOCARDIOGRAM (TEE), APPLICATION OF ANGIOVAC   SURGEON:     Corliss Skains, MD - Primary  PHYSICIAN ASSISTANT: Doree Fudge PA-C  ANESTHESIA:   general  EBL:  Per anesthesia record  BLOOD ADMINISTERED:none  DRAINS: none   SPECIMEN:  Source of Specimen:  TV vegetation  DISPOSITION OF SPECIMEN:   Culture  COUNTS CORRECT:  YES  DICTATION: .Dragon Dictation  PLAN OF CARE: Admit to inpatient   PATIENT DISPOSITION:  PACU - hemodynamically stable.   Delay start of Pharmacological VTE agent (>24hrs) due to surgical blood loss or risk of bleeding: yes

## 2021-01-04 NOTE — Progress Notes (Signed)
Arrived for code stroke, patient noted to have enough IV access at this time.

## 2021-01-04 NOTE — Anesthesia Procedure Notes (Signed)
Arterial Line Insertion Start/End10/05/2020 7:45 AM, 01/04/2021 7:55 AM Performed by: CRNA  Patient location: OR. Preanesthetic checklist: patient identified, IV checked, site marked, risks and benefits discussed, surgical consent, monitors and equipment checked, pre-op evaluation, timeout performed and anesthesia consent Left, radial was placed Catheter size: 20 G Hand hygiene performed , maximum sterile barriers used  and Seldinger technique used Allen's test indicative of satisfactory collateral circulation Attempts: 2 Procedure performed without using ultrasound guided technique. Following insertion, dressing applied and Biopatch. Post procedure assessment: normal  Patient tolerated the procedure well with no immediate complications.

## 2021-01-04 NOTE — Consult Note (Addendum)
NAME:  Kathryn Medina, MRN:  [id number], DOB:  12-Feb-1991, LOS: 6 ADMISSION DATE:  12/28/2020, CONSULTATION DATE:  01/13/2021 REFERRING MD: Harmon Dun, CHIEF COMPLAINT: Acute stroke  History of Present Illness:  30 year old with polysubstance abuse, MSSA endocarditis, hepatitis C admitted with MSSA bacteremia in the setting of known tricuspid valve endocarditis.  She underwent angio Vac debridement of tricuspid valve on 10/6.  Code stroke called this evening with CT findings suggestive of large left MCA stroke.  She is not a candidate for thrombectomy as she recently received Eliquis.  Neurology consulted and has recommended 3% saline  Pertinent  Medical History    has a past medical history of Asthma and Hepatitis.,  Polysubstance abuse, tricuspid valve endocarditis  Significant Hospital Events: Including procedures, antibiotic start and stop dates in addition to other pertinent events   9/30-admit.  On methadone for opioid withdrawal 10/6-angio vac debridement of tricuspid valve, code stroke in left MCA  Interim History / Subjective:    Objective   Blood pressure 100/77, pulse (!) 101, temperature 97.7 F (36.5 C), temperature source Oral, resp. rate 15, height 5\' 7"  (1.702 m), weight 63.1 kg, SpO2 98 %.        Intake/Output Summary (Last 24 hours) at 01-13-21 2133 Last data filed at 01-13-21 0944 Gross per 24 hour  Intake 1400 ml  Output 100 ml  Net 1300 ml   Filed Weights   12/18/2020 0100 12/21/2020 0108 12/08/2020 2100  Weight: 58.8 kg 52.2 kg 63.1 kg    Examination: Blood pressure 100/77, pulse (!) 101, temperature 97.7 F (36.5 C), temperature source Oral, resp. rate 15, height 5\' 7"  (1.702 m), weight 63.1 kg, SpO2 98 %. Gen:      No acute distress HEENT:  EOMI, sclera anicteric Neck:     No masses; no thyromegaly Lungs:   Scattered rhonchi CV:         Regular rate and rhythm; no murmurs Abd:      + bowel sounds; soft, non-tender; no palpable masses, no  distension Ext:    No edema; adequate peripheral perfusion Skin:      Warm and dry; no rash Neuro: Nonverbal, does not respond to commands.  Reported right-sided weakness  Resolved Hospital Problem list     Assessment & Plan:  Code stroke, left MCA infarct She is not a candidate for thrombectomy due to multifocal occlusions with good collateral flow Holding Xarelto She is at risk for developing edema and mass-effect but is not apparent at present Will replete sodium up slowly with normal saline instead of hypertonic saline. Target 8-10 mEq correction in 24 hrs Discussed with Dr. 12/31/20, Neurology  Tricuspid valve endocarditis, MSSA bacteremia S/p Angiovac debridement Continue cefazolin per infectious disease  Septic pulmonary emboli, pneumonia Continue antibotics.  She is protecting airway for now she is at high risk for decompensation and aspirin Transfer to ICU for closer monitoring  Severe malnutrition Monitor   Best Practice (right click and "Reselect all SmartList Selections" daily)   Diet/type: NPO DVT prophylaxis: SCD GI prophylaxis: N/A Lines: Central line Foley:  N/A Code Status:  full code Last date of multidisciplinary goals of care discussion []   Labs   CBC: Recent Labs  Lab 12/04/2020 0033 12/09/2020 0352 12/08/2020 0634 12/11/2020 1405 12/31/20 0554 01/02/21 0103 01/03/21 0129 2021/01/13 0134 2021-01-13 2028  WBC 17.0*   < > 10.9*   < > 14.8* 16.8* 15.2* 15.0* 21.3*  NEUTROABS 14.7*  --  9.1*  --   --   --   --   --   --  HGB 8.7*   < > 6.8*   < > 8.1* 7.6* 7.0* 7.2* 7.5*  HCT 27.7*   < > 21.9*   < > 24.9* 25.1* 22.4* 23.9* 25.1*  MCV 74.5*   < > 75.0*   < > 75.5* 79.7* 79.2* 80.5 79.7*  PLT 131*   < > 99*   < > 107* 150 183 190 208   < > = values in this interval not displayed.    Basic Metabolic Panel: Recent Labs  Lab 12/23/2020 0849 12/08/2020 1405 12/30/20 0041 12/30/20 0707 12/31/20 0554 01/02/21 0103 01-30-21 2028  NA  --    < > 127* 128*  128* 131* 133*  K  --    < > 4.2 4.2 4.3 4.4 4.7  CL  --    < > 97* 99 98 99 102  CO2  --    < > 23 22 20* 22 23  GLUCOSE  --    < > 100* 94 96 90 124*  BUN  --    < > 8 6 5* 6 8  CREATININE  --    < > 0.47 0.47 0.57 0.64 0.40*  CALCIUM  --    < > 7.3* 7.5* 7.8* 7.6* 8.4*  MG 1.4*  --   --  1.8  --   --   --   PHOS 3.4  --   --   --   --   --   --    < > = values in this interval not displayed.   GFR: Estimated Creatinine Clearance: 100 mL/min (A) (by C-G formula based on SCr of 0.4 mg/dL (L)). Recent Labs  Lab 12/17/2020 1405 12/26/2020 1607 12/16/2020 2128 12/30/20 0041 12/31/20 0554 01/02/21 0103 01/03/21 0129 01-30-21 0134 2021/01/30 2028  WBC  --   --   --  11.7*   < > 16.8* 15.2* 15.0* 21.3*  LATICACIDVEN 1.9 3.2* 2.4* 1.7  --   --   --   --   --    < > = values in this interval not displayed.    Liver Function Tests: Recent Labs  Lab 12/01/2020 0033 12/27/2020 0352 Jan 30, 2021 2028  AST 61* 41 33  ALT 24 16 15   ALKPHOS 162* 110 111  BILITOT 1.1 1.0 0.5  PROT 7.0 5.4* 6.6  ALBUMIN 2.2* 1.7* 1.9*   No results for input(s): LIPASE, AMYLASE in the last 168 hours. No results for input(s): AMMONIA in the last 168 hours.  ABG    Component Value Date/Time   PHART 7.444 2021/01/30 2027   PCO2ART 31.5 (L) 01-30-2021 2027   PO2ART 80.9 (L) 01-30-21 2027   HCO3 21.3 30-Jan-2021 2027   TCO2 21 08/25/2008 1917   ACIDBASEDEF 2.2 (H) 01/30/2021 2027   O2SAT 96.3 30-Jan-2021 2027     Coagulation Profile: Recent Labs  Lab 12/07/2020 0033 12/22/2020 0352  INR 1.3* 1.5*    Cardiac Enzymes: No results for input(s): CKTOTAL, CKMB, CKMBINDEX, TROPONINI in the last 168 hours.  HbA1C: Hgb A1c MFr Bld  Date/Time Value Ref Range Status  02/20/2016 01:46 PM 4.9 4.8 - 5.6 % Final    Comment:             Pre-diabetes: 5.7 - 6.4          Diabetes: >6.4          Glycemic control for adults with diabetes: <7.0     CBG: No results for input(s): GLUCAP in the last 168  hours.  Review of Systems:   Unable to obtain due to altered mental status  Past Medical History:  She,  has a past medical history of Asthma and Hepatitis.   Surgical History:   Past Surgical History:  Procedure Laterality Date   DILATION AND EVACUATION  03/22/2012   Procedure: DILATATION AND EVACUATION;  Surgeon: Brock Bad, MD;  Location: [facility];  Service: Gynecology;  Laterality: N/A;   TEE WITHOUT CARDIOVERSION N/A 11/24/2020   Procedure: TRANSESOPHAGEAL ECHOCARDIOGRAM (TEE);  Surgeon: Thurmon Fair, MD;  [facility] END[facility]COPY;  Service: Cardiovascular;  Laterality: N/A;     Social History:   reports that she has been smoking cigarettes. She has a 5.00 pack-year smoking history. She has never used smokeless tobacco. She reports current alcohol use. She reports current drug use. Drugs: Oxycodone, Marijuana, Heroin, Cocaine, Amphetamines, and Fentanyl.   Family History:  Her family history includes Cancer in her father, paternal grandfather, and paternal grandmother; Emphysema in her maternal grandmother; Hypertension in her father.   Allergies Allergies  Allergen Reactions   Other Hives    pork     Home Medications  Prior to Admission medications   Medication Sig Start Date End Date Taking? Authorizing Provider  acetaminophen (TYLENOL) 500 MG tablet Take 1,000 mg by mouth every 6 (six) hours as needed for mild pain.   Yes [provider]  Aspirin-Salicylamide-Caffeine (BC FAST PAIN RELIEF) 650-195-33.3 MG PACK Take 1 Package by mouth daily as needed (pain).   Yes [provider]  cephALEXin (KEFLEX) 500 MG capsule Take 1 capsule (500 mg total) by mouth 4 (four) times daily. 12/01/20 01/30/21 Yes Marolyn Haller, MD  ibuprofen (ADVIL) 200 MG tablet Take 200-800 mg by mouth every 6 (six) hours as needed for headache or moderate pain.   Yes [provider]  RIVAROXABAN Carlena Hurl) VTE STARTER PACK (15 & 20 MG) Follow package directions: Take one  15mg  tablet by mouth twice a day. On day 22, switch to one 20mg  tablet once a day. Take with food. 12/01/20  Yes , MD     Critical care time:    The patient is critically ill with multiple organ system failure and requires high complexity decision making for assessment and support, frequent evaluation and titration of therapies, advanced monitoring, review of radiographic studies and interpretation of complex data.   Critical Care Time devoted to patient care services, exclusive of separately billable procedures, described in this note is 45 minutes.   01/31/21 MD [facility] See Amion for pager  If no response to pager , please call [phone] until 7pm After 7:00 pm call Elink  [phone] 01/02/2021, 10:10 PM

## 2021-01-04 NOTE — Anesthesia Postprocedure Evaluation (Signed)
Anesthesia Post Note  Patient: Yarrow Linhart  Procedure(s) Performed: APPLICATION OF ANGIOVAC TRANSESOPHAGEAL ECHOCARDIOGRAM (TEE)     Patient location during evaluation: PACU Anesthesia Type: General Level of consciousness: awake and alert Pain management: pain level controlled Vital Signs Assessment: post-procedure vital signs reviewed and stable Respiratory status: spontaneous breathing, nonlabored ventilation, respiratory function stable and patient connected to nasal cannula oxygen Cardiovascular status: blood pressure returned to baseline and stable Postop Assessment: no apparent nausea or vomiting Anesthetic complications: no   No notable events documented.  Last Vitals:  Vitals:   01/14/2021 1000 01/03/2021 1015  BP: 111/82 118/74  Pulse: (!) 120 (!) 123  Resp: (!) 23 18  Temp:  36.7 C  SpO2: 94% 95%    Last Pain:  Vitals:   01/03/2021 1015  TempSrc:   PainSc: Asleep                 Alaija Ruble S

## 2021-01-04 NOTE — Op Note (Signed)
      [address]       [place] [address]             [phone]          01/27/2019   Patient:  Kathryn Medina Pre-Op Dx:     Tricuspid valve endocarditis   MSSA bacteremia                         Sepsis                         Septic pulmonary emboli   Post-op Dx:  same Procedure: - right femoral vein cannulation with a 31F cannula - Right internal jugular vein cannulation with a 35F Sheath - Right heart cannulation - Debridement of right atrial mass - Debridement of tricuspid valve vegetation   Surgeon and Role:      * Iyah Laguna, Eliezer Lofts, MD - Primary    Jacques Earthly, PA-C - assisting Anesthesia  general EBL:  150 ml Blood Administration: none Specimen:  Tricuspid valve vegetation   Indications: The admitted to the hospital with tricuspid valve endocarditis and MSSA bacteremia.  Due to ongoing drug use, the patient was not a good surgical candidate for valve replacement.  The patient did however have a large tricuspid valve vegetation and evidence of multiple septic pulmonary emboli.  Catheter-based debridement of the tricuspid valve vegetation was recommended.   Findings: of VV ECMO Good debulking of the tricuspid valve.  Residual vegetation was engaged, but did not debride.  Most of it was subvalvular.   Operative Technique: After the risks, benefits and alternatives were thoroughly discussed, the patient was brought to the operative theatre.  Anesthesia was induced, and she was prepped and draped in normal sterile fashion.  An appropriate surgical pause was performed and preoperative antibiotics were dosed accordingly.   We began with ultrasound-guided cannulation of the right femoral vein using a micropuncture set.  We confirmed that our wire was in the IVC using fluoroscopy.  After systemically heparinizing the patient, the venotomy was then sequentially dilated over wire and our 18 French catheter was in place.  This catheter was then  connected to the angiovac circuit.   Next we moved to the right internal jugular vein.  Using ultrasound guidance and a micropuncture set we accessed the vein.  Wire was then threaded down the SVC into the heart and down into the abdominal IVC.  The tract was then dilated sequentially using fluoroscopy.  In the 22 [place] dry seal sheath was then inserted.  After we confirmed therapeutic ACT, the ECMO circuit was initiated and we used the Angiovac to debride the tricuspid valve with TEE guidance.   After achieving an optimal result we discontinued our procedure, and returned the remaining blood from the Angiovac circuit to the patient.  The catheters were removed and the sites were closed with a pledgeted mattress suture.  Pressure was held while heparinization was reversed with protamine.   The patient tolerated the procedure without any immediate complications, and was transferred to the PACU in stable condition.   Aris Moman Keane Scrape

## 2021-01-04 NOTE — Progress Notes (Signed)
     [address]       [place] [address]             [phone]       No events  Vitals:   01/06/2021 0000 01/09/2021 0423  BP: 93/62 102/72  Pulse: (!) 124 (!) 130  Resp: 15 18  Temp: 99.1 F (37.3 C) 99.5 F (37.5 C)  SpO2: 92% 93%   Alert NAD Sinus tach EWOB  OR today for angiovac debridement of the tricuspid  Corliss Skains

## 2021-01-04 NOTE — Transfer of Care (Signed)
Immediate Anesthesia Transfer of Care Note  Patient: Kathryn Medina  Procedure(s) Performed: APPLICATION OF ANGIOVAC TRANSESOPHAGEAL ECHOCARDIOGRAM (TEE)  Patient Location: PACU  Anesthesia Type:General  Level of Consciousness: drowsy and patient cooperative  Airway & Oxygen Therapy: Patient Spontanous Breathing and Patient connected to face mask oxygen  Post-op Assessment: Report given to RN and Post -op Vital signs reviewed and stable  Post vital signs: Reviewed and stable  Last Vitals:  Vitals Value Taken Time  BP 127/76 01/22/2021 0943  Temp 36.4 C 01/01/2021 0930  Pulse 124 01/02/2021 0944  Resp 29 01/29/2021 0944  SpO2 93 % 01/08/2021 0944  Vitals shown include unvalidated device data.  Last Pain:  Vitals:   01/28/2021 0930  TempSrc:   PainSc: Asleep      Patients Stated Pain Goal: 0 (12/31/20 2115)  Complications: No notable events documented.

## 2021-01-04 NOTE — Progress Notes (Signed)
On initial assessment, pt would wake and look around but was not vocalizing.  When the patient was asked to state her name, she would move her mouth but with no vocalizations. She was able to follow some commands.  On call MD notified and came to assess at bedside.  Code stroke called based on their findings.

## 2021-01-04 NOTE — Progress Notes (Signed)
Pt's mother, Judeth Gilles, called to inquire about surgery time and pt condition.  Pt gave permission to discuss information with her mother.

## 2021-01-04 NOTE — Anesthesia Procedure Notes (Signed)
Central Venous Catheter Insertion Performed by: Eilene Ghazi, MD, anesthesiologist Start/End10/07/2020 7:20 AM, 01/04/2021 7:35 AM Patient location: OR. Preanesthetic checklist: patient identified, IV checked, site marked, risks and benefits discussed, surgical consent, monitors and equipment checked, pre-op evaluation, timeout performed and anesthesia consent Position: Trendelenburg Lidocaine 1% used for infiltration and patient sedated Hand hygiene performed  and maximum sterile barriers used  Catheter size: 8 Fr Total catheter length 16. Central line was placed.Double lumen Procedure performed using ultrasound guided technique. Ultrasound Notes:anatomy identified, needle tip was noted to be adjacent to the nerve/plexus identified, no ultrasound evidence of intravascular and/or intraneural injection and image(s) printed for medical record Attempts: 1 Following insertion, dressing applied, line sutured and Biopatch. Post procedure assessment: blood return through all ports  Patient tolerated the procedure well with no immediate complications.

## 2021-01-04 NOTE — Discharge Instructions (Signed)
Kathryn Medina you were admitted to the hospital because of MSSA bacteremia, likely resulting from your tricuspid valve infective endocarditis. You were started on IV antibiotics, and a repeat blood culture shows no growth of bacteria. A picture of your heart showed worsening of the heart valve infection, so CT surgery was consulted and Dr Cliffton Asters performed an angiovac to remove as much of the infection from your valve as possible by the team.   The plan for you is as follow, PLEASE: Continue taking Xarelto

## 2021-01-04 NOTE — Code Documentation (Signed)
Code stroke called at 2018 for aphasia, R arm weakness, LKW 1800. Code stroke team arrived at 2023. Patient alert but agitated, protecting her airway. NIH 19. Patient taken to CT. CTH read: Evolving subacute infarct in the left MCA distribution involving essentially the entire temporal lobe and portion of the parietal lobe. No evidence of hemorrhagic transformation or significant mass effect or midline shift at this time. ASPECTS is 5. Probably hyperdense MCA on the left. Not TNK or IR candidate per Dr. Otelia Limes. Plan to tx patient to 4N ICU for closer monitoring.

## 2021-01-04 NOTE — Progress Notes (Signed)
   01/03/21 1956  Assess: MEWS Score  Temp 99.3 F (37.4 C)  BP (!) 97/58  Pulse Rate (!) 144  ECG Heart Rate (!) 146  Resp 18  SpO2 94 %  Assess: MEWS Score  MEWS Temp 0  MEWS Systolic 1  MEWS Pulse 3  MEWS RR 0  MEWS LOC 0  MEWS Score 4  MEWS Score Color Red  Assess: if the MEWS score is Yellow or Red  Were vital signs taken at a resting state? Yes  Focused Assessment No change from prior assessment  Early Detection of Sepsis Score *See Row Information* Low  MEWS guidelines implemented *See Row Information* Yes  Treat  MEWS Interventions Administered scheduled meds/treatments  Pain Scale 0-10  Pain Score 0  Take Vital Signs  Increase Vital Sign Frequency  Red: Q 1hr X 4 then Q 4hr X 4, if remains red, continue Q 4hrs  Escalate  MEWS: Escalate Red: discuss with charge nurse/RN and provider, consider discussing with RRT  Notify: Charge Nurse/RN  Name of Charge Nurse/RN Notified Thomes Dinning, RN  Date Charge Nurse/RN Notified 01/03/21  Time Charge Nurse/RN Notified 2000  Document  Patient Outcome Other (Comment)  Progress note created (see row info) Yes   Pt was sitting up in bed and moving around.  Pt states that her HR increases like this every time she gets up.  Nursing staff familiar with the patient confirmed her statement.  The patient remained somewhat active and had a visitor until around midnight when she laid down to rest.  The MD on call was notified of her tachycardia

## 2021-01-04 NOTE — Anesthesia Procedure Notes (Signed)
Procedure Name: Intubation Date/Time: 01-14-2021 7:32 AM Performed by: Adria Dill, CRNA Pre-anesthesia Checklist: Patient identified, Emergency Drugs available, Suction available and Patient being monitored Patient Re-evaluated:Patient Re-evaluated prior to induction Oxygen Delivery Method: Circle system utilized Preoxygenation: Pre-oxygenation with 100% oxygen Induction Type: IV induction and Rapid sequence Ventilation: Mask ventilation without difficulty Laryngoscope Size: Miller and 3 Grade View: Grade I Tube type: Oral Tube size: 7.5 mm Number of attempts: 1 Airway Equipment and Method: Stylet and Oral airway Placement Confirmation: ETT inserted through vocal cords under direct vision, positive ETCO2 and breath sounds checked- equal and bilateral Secured at: 21 cm Tube secured with: Tape Dental Injury: Teeth and Oropharynx as per pre-operative assessment

## 2021-01-04 NOTE — Progress Notes (Signed)
Pts boyfriend at bedside attempted to give her an ensure this am.  Nursing intervened.  When questioned, the patient stated that her mouth was really dry and that she needed something to drink. Nursing provided mouth rinse and offered some moisturizer.  The patient stated repeatedly that she had not had anything to eat or drink past midnight to different staff members.  Staff reported that multiple drinks had been brought to that room at the request of her boyfriend.  Nursing is unsure if she has had any.

## 2021-01-04 NOTE — Consult Note (Signed)
NEURO HOSPITALIST CONSULT NOTE   Requestig physician: Dr. Allena Katz  Reason for Consult: Acute onset of left sided weakness and aphasia  History obtained from:  MD Team, RN Team and Chart     HPI:                                                                                                                                          Kathryn Medina is an 30 y.o. 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  She left AMA on 9/2 of this year without completing treatment.  She has been taking oral antibiotics per reports. She has had three recent admissions to [facility] for MSSA bacteremia and TV IE with septic pulmonary emboli (8/23, 8/21, 9/2). Patient has left AMA after each admission, last AMA discharge on 9/2. She had been feeling progressively worse over the last week. She has been having increasing SHOB, fatigue, palpitations, and fevers up to 103. Last used IV drugs citing fentanyl the day prior to admission in addition to cocaine and marijuana.   During this admission she has been diagnosed with tricuspid valve infective endocarditis with MSSA bacteremia and concern for sepsis. TTE was significant for a large vegetation 2.42 x 2.5 cm and moderate regurgitation. She is s/p Angiovac debridement of the tricuspid valve performed by Dr. Cliffton Asters this AM: there was good debulking of the tricuspid valve, with residual vegetation not debrided. She has remained afebrile. Marland Kitchen   She was on Xarelto for septic PE (started on BID dosing at last discharge), but this was stopped yesterday in preparation for the procedure; plan was to hold for cardiothoracic surgery. Last dose at 8 AM yesterday.   This evening she was noted to become acutely nonverbal, not following  commands, inability to gaze to the right and with right sided weakness. Code Stroke was called.   Most recent CT head performed on 9/30 was normal.   Past Medical History:  Diagnosis Date   Asthma    does not use inhaler   Hepatitis     Past Surgical History:  Procedure Laterality Date   DILATION AND EVACUATION  03/22/2012   Procedure: DILATATION AND EVACUATION;  Surgeon: Brock Bad, MD;  Location: [facility];  Service: Gynecology;  Laterality: N/A;   TEE WITHOUT CARDIOVERSION N/A 11/24/2020   Procedure: TRANSESOPHAGEAL ECHOCARDIOGRAM (TEE);  Surgeon: Thurmon Fair, MD;  Location: [facility] ENDOSCOPY;  Service: Cardiovascular;  Laterality: N/A;    Family History  Problem Relation Age of Onset   Cancer Paternal Grandfather    Cancer Paternal Grandmother  Emphysema Maternal Grandmother    Hypertension Father    Cancer Father             Social History:  reports that she has been smoking cigarettes. She has a 5.00 pack-year smoking history. She has never used smokeless tobacco. She reports current alcohol use. She reports current drug use. Drugs: Oxycodone, Marijuana, Heroin, Cocaine, Amphetamines, and Fentanyl.  Allergies  Allergen Reactions   Other Hives    pork    MEDICATIONS:                                                                                                                     Scheduled:  Chlorhexidine Gluconate Cloth  6 each Topical Daily   methadone  10 mg Oral TID   nicotine  14 mg Transdermal Daily   [START ON 01/06/2021] rivaroxaban  15 mg Oral BID   Continuous:  acetaminophen      ceFAZolin (ANCEF) IV 2 g (01/15/21 1730)     ROS:                                                                                                                                       Unable to obtain due to aphasia.    Blood pressure 100/77, pulse (!) 101, temperature 97.7 F (36.5 C), temperature source Oral, resp. rate 15, height 5\' 7"  (1.702 m), weight 63.1 kg, SpO2  98 %.   General Examination:                                                                                                       Physical Exam  HEENT-  [place]/AT. Skin is pale.    Lungs- Respirations unlabored Extremities- No edema. Track marks are present in the antecubital fossae bilaterally.   Neurological Examination Mental Status: Awake and anxious appearing. Nonverbal to repeated questions but does exclaim with profanities when team attempts to place IV. Not following any verbal commands except for squeezing of examiner's hand, pulling  with left arm and gazing to left and right.  Cranial Nerves:  II: No blink to threat on the right. PERRL.   III,IV, VI: Mild left ptosis. Eyes are midline. Can gaze to the left without difficulty. Initially unable to gaze to the right, but was able to cross midline to the right  with difficulty after repeated requests. No nystagmus.  V,VII: Labial fissure slightly smaller to the right of midline as compared to the left.  VIII: Hearing intact to voice IX,X: Unable to assess XI: Able to turn her head to the right with some difficulty.  XII: Was unable to protrude tongue  Motor: RUE 0/5 with decreased tone RLE 3/5 strenght proximally, able to elevate less so than the left. 3/5 knee extension.  LUE 5/5 LLE 5/5 Sensory: No reaction to pinch RUE. Weak withdrawal to plantar stimulation RLE. Normal responses to noxious on the left.  Deep Tendon Reflexes: Right sided reflexes are hypoactive. Right toe upgoing, left toe downgoing  Cerebellar/Gait: Unable to assess    Lab Results: Basic Metabolic Panel: Recent Labs  Lab 12/17/2020 0849 12/26/2020 1405 12/03/2020 2128 12/30/20 0041 12/30/20 0707 12/31/20 0554 01/02/21 0103  NA  --    < > 129* 127* 128* 128* 131*  K  --    < > 3.6 4.2 4.2 4.3 4.4  CL  --    < > 97* 97* 99 98 99  CO2  --    < > 23 23 22  20* 22  GLUCOSE  --    < > 121* 100* 94 96 90  BUN  --    < > 8 8 6  5* 6  CREATININE  --    < > 0.52  0.47 0.47 0.57 0.64  CALCIUM  --    < > 7.6* 7.3* 7.5* 7.8* 7.6*  MG 1.4*  --   --   --  1.8  --   --   PHOS 3.4  --   --   --   --   --   --    < > = values in this interval not displayed.    CBC: Recent Labs  Lab 12/21/2020 0033 12/11/2020 0352 12/26/2020 12/31/20 12/09/2020 1405 12/31/20 0554 01/02/21 0103 01/03/21 0129 2021/01/10 0134 10-Jan-2021 2028  WBC 17.0*   < > 10.9*   < > 14.8* 16.8* 15.2* 15.0* 21.3*  NEUTROABS 14.7*  --  9.1*  --   --   --   --   --   --   HGB 8.7*   < > 6.8*   < > 8.1* 7.6* 7.0* 7.2* 7.5*  HCT 27.7*   < > 21.9*   < > 24.9* 25.1* 22.4* 23.9* 25.1*  MCV 74.5*   < > 75.0*   < > 75.5* 79.7* 79.2* 80.5 79.7*  PLT 131*   < > 99*   < > 107* 150 183 190 208   < > = values in this interval not displayed.    Cardiac Enzymes: No results for input(s): CKTOTAL, CKMB, CKMBINDEX, TROPONINI in the last 168 hours.  Lipid Panel: No results for input(s): CHOL, TRIG, HDL, CHOLHDL, VLDL, LDLCALC in the last 168 hours.  Imaging: DG C-Arm 1-60 Min-No Report  Result Date: 2021-01-10 Fluoroscopy was utilized by the requesting physician.  No radiographic interpretation.     Assessment: 30 year old female with infective endocarditis who was noted to become nonverbal with right sided weakness this evening. LKN was 6 PM.  1. Exam  reveals global aphasia and left sided weakness.  2. CT head reveals a large completed stroke involving almost all of the left temporal lobe, the medial left occipital lobe and a portion of the left parietal lobe. ASPECTS 5.  3. CTA of head and neck: Positive CTA for with acute occlusion at the supraclinoid left ICA occlusion, extending into the left ICA terminus. Fairly robust collateral flow seen distally within the left MCA distribution. Additional acute right M3 occlusion versus severe stenosis. Irregular attenuated flow distally. Occlusion versus severe stenosis involving the left P2 segment. Additional subsequent distal left P4 occlusion. Left vertebral  artery occluded at its origin with distal reconstitution at the proximal V2 segment. Right vertebral artery widely patent.  4. Also noted on CTA is layering right pleural effusion with multifocal patchy and ground-glass opacities within the visualized lungs, concerning for multifocal infection/pneumonia.  5. Etiology of her stroke and multifocal occlusions is most likely septic emboli from bacterial endocarditis, versus perioperative stroke from her percutaneous endocarditis debulking procedure today.   Recommendations: 1. Not a tPA candidate due to recent DOAC administration.   2. LVO present on CTA but completed infarction on CT is very large with ASPECTS of 5. Therefore not a thrombectomy candidate.  3. Anticoagulation not indicated for two reasons: Septic emboli from bacterial endocarditis, and large territory stroke with high risk for bleeding.  4. Regarding stroke prophylaxis, per the literature: - In patients with IE without a separate indication for anticoagulation or antiplatelet therapy, initiation of anticoagulant or antiplatelet therapy is not indicated. The available data do not demonstrate a convincing benefit for antithrombotic therapy in preventing embolic complications in patients with IE and suggest possible increased risk of bleeding complications.  - Patients with other indications for antithrombotic therapy -- In patients with IE with a separate indication for antithrombotic therapy, the risk of withholding antithrombotic therapy is weighed against the risk of antithrombotic therapy (particularly the risk of intracerebral hemorrhage). 5. Risk for malignant edema in the setting of the large left MCA stroke, which would be a potential indication for hypertonic saline. However, she is currently hyponatremic, so cannot get to therapeutic Na range rapidly without risk of central pontine myelinolysis. Recommend gradually increasing Na level with NS rather than hypertonic saline, with  frequent sodium checks. Discussed with Dr. Isaiah Serge.  6. Repeat CT head in 24 hours.  7. MRI brain when able.  8. Frequent neuro checks.  9. Not a permissive HTN candidate due to the large stroke with high risk of bleeding. Recommend maintaining her SBP between 120-150. 10. Further cardiac imaging studies per Cardiology and CT Surgery 11. Cardiac telemetry. 12. ABX per primary team and ID.   85 minutes spent in the emergent neurological evaluation and management of this critically ill patient. Time spent included coordination of care.   Electronically signed: Dr. Caryl Pina 01/06/2021, 8:40 PM

## 2021-01-04 NOTE — Anesthesia Preprocedure Evaluation (Addendum)
Anesthesia Evaluation  Patient identified by MRN, date of birth, ID band Patient awake  General Assessment Comment:Patient with MSSA septicemia  Unable to provide medical history this am  Reviewed: Allergy & Precautions, NPO status , Patient's Chart, lab work & pertinent test results  Airway Mallampati: II  TM Distance: >3 FB Neck ROM: Full    Dental  (+) Poor Dentition, Missing, Chipped   Pulmonary neg pulmonary ROS, Current Smoker and Patient abstained from smoking.,    Pulmonary exam normal breath sounds clear to auscultation       Cardiovascular + Valvular Problems/Murmurs  Rhythm:Regular Rate:Tachycardia + Systolic murmurs TR   Neuro/Psych negative neurological ROS  negative psych ROS   GI/Hepatic negative GI ROS, (+)     substance abuse  IV drug use, Hepatitis -  Endo/Other  negative endocrine ROS  Renal/GU negative Renal ROS  negative genitourinary   Musculoskeletal negative musculoskeletal ROS (+)   Abdominal   Peds negative pediatric ROS (+)  Hematology  (+) anemia ,   Anesthesia Other Findings   Reproductive/Obstetrics negative OB ROS                            Anesthesia Physical Anesthesia Plan  ASA: 4  Anesthesia Plan: General   Post-op Pain Management:    Induction: Intravenous and Rapid sequence  PONV Risk Score and Plan: 2 and Ondansetron, Dexamethasone and Treatment may vary due to age or medical condition  Airway Management Planned: Oral ETT  Additional Equipment: Arterial line and TEE  Intra-op Plan:   Post-operative Plan: Extubation in OR  Informed Consent: I have reviewed the patients History and Physical, chart, labs and discussed the procedure including the risks, benefits and alternatives for the proposed anesthesia with the patient or authorized representative who has indicated his/her understanding and acceptance.     Dental advisory  given  Plan Discussed with: CRNA and Surgeon  Anesthesia Plan Comments: (TEE for monitoring )        Anesthesia Quick Evaluation

## 2021-01-05 ENCOUNTER — Encounter (HOSPITAL_COMMUNITY): Payer: Self-pay | Admitting: Thoracic Surgery (Cardiothoracic Vascular Surgery)

## 2021-01-05 ENCOUNTER — Inpatient Hospital Stay (HOSPITAL_COMMUNITY): Payer: Medicaid Other

## 2021-01-05 DIAGNOSIS — E440 Moderate protein-calorie malnutrition: Secondary | ICD-10-CM | POA: Insufficient documentation

## 2021-01-05 DIAGNOSIS — I6330 Cerebral infarction due to thrombosis of unspecified cerebral artery: Secondary | ICD-10-CM | POA: Insufficient documentation

## 2021-01-05 DIAGNOSIS — G936 Cerebral edema: Secondary | ICD-10-CM

## 2021-01-05 DIAGNOSIS — I63232 Cerebral infarction due to unspecified occlusion or stenosis of left carotid arteries: Secondary | ICD-10-CM

## 2021-01-05 DIAGNOSIS — F1193 Opioid use, unspecified with withdrawal: Secondary | ICD-10-CM

## 2021-01-05 DIAGNOSIS — I639 Cerebral infarction, unspecified: Secondary | ICD-10-CM

## 2021-01-05 LAB — BASIC METABOLIC PANEL
Anion gap: 7 (ref 5–15)
Anion gap: 9 (ref 5–15)
BUN: 10 mg/dL (ref 6–20)
BUN: 12 mg/dL (ref 6–20)
CO2: 22 mmol/L (ref 22–32)
CO2: 19 mmol/L — ABNORMAL LOW (ref 22–32)
Calcium: 8.2 mg/dL — ABNORMAL LOW (ref 8.9–10.3)
Calcium: 8 mg/dL — ABNORMAL LOW (ref 8.9–10.3)
Chloride: 103 mmol/L (ref 98–111)
Chloride: 112 mmol/L — ABNORMAL HIGH (ref 98–111)
Creatinine, Ser: 0.44 mg/dL (ref 0.44–1.00)
Creatinine, Ser: 0.44 mg/dL (ref 0.44–1.00)
GFR, Estimated: >60 mL/min (ref >60)
GFR, Estimated: >60 mL/min (ref >60)
Glucose, Bld: 104 mg/dL — ABNORMAL HIGH (ref 70–99)
Glucose, Bld: 132 mg/dL — ABNORMAL HIGH (ref 70–99)
Potassium: 4.2 mmol/L (ref 3.5–5.1)
Potassium: 3.9 mmol/L (ref 3.5–5.1)
Sodium: 132 mmol/L — ABNORMAL LOW (ref 135–145)
Sodium: 140 mmol/L (ref 135–145)

## 2021-01-05 LAB — SODIUM
Sodium: 141 mmol/L (ref 135–145)
Sodium: 142 mmol/L (ref 135–145)

## 2021-01-05 LAB — GLUCOSE, CAPILLARY
Glucose-Capillary: 115 mg/dL — ABNORMAL HIGH (ref 70–99)
Glucose-Capillary: 121 mg/dL — ABNORMAL HIGH (ref 70–99)

## 2021-01-05 LAB — CBC
HCT: 23.6 % — ABNORMAL LOW (ref 36.0–46.0)
Hemoglobin: 7.3 g/dL — ABNORMAL LOW (ref 12.0–15.0)
MCH: 24.7 pg — ABNORMAL LOW (ref 26.0–34.0)
MCHC: 30.9 g/dL (ref 30.0–36.0)
MCV: 80 fL (ref 80.0–100.0)
Platelets: 207 10*3/uL (ref 150–400)
RBC: 2.95 MIL/uL — ABNORMAL LOW (ref 3.87–5.11)
RDW: 22.7 % — ABNORMAL HIGH (ref 11.5–15.5)
WBC: 17.8 10*3/uL — ABNORMAL HIGH (ref 4.0–10.5)
nRBC: 0 % (ref 0.0–0.2)

## 2021-01-05 LAB — POCT ACTIVATED CLOTTING TIME
Activated Clotting Time: 161 seconds
Activated Clotting Time: 271 seconds
Activated Clotting Time: 156 seconds

## 2021-01-05 LAB — MAGNESIUM
Magnesium: 1.9 mg/dL (ref 1.7–2.4)
Magnesium: 1.9 mg/dL (ref 1.7–2.4)

## 2021-01-05 LAB — PHOSPHORUS: Phosphorus: 4.4 mg/dL (ref 2.5–4.6)

## 2021-01-05 MED ORDER — ACETAMINOPHEN 325 MG PO TABS
650.0000 mg | ORAL_TABLET | Freq: Four times a day (QID) | ORAL | Status: DC | PRN
  Administered 2021-01-06: 650 mg
  Filled 2021-01-05: qty 2

## 2021-01-05 MED ORDER — SODIUM CHLORIDE 3 % IV SOLN
INTRAVENOUS | Status: DC
  Filled 2021-01-05 (×2): qty 500

## 2021-01-05 MED ORDER — PROSOURCE TF PO LIQD
45.0000 mL | Freq: Every day | ORAL | Status: DC
  Administered 2021-01-06: 45 mL
  Filled 2021-01-05: qty 45

## 2021-01-05 MED ORDER — SODIUM CHLORIDE 3 % IV BOLUS
250.0000 mL | Freq: Once | INTRAVENOUS | Status: AC
  Administered 2021-01-05: 250 mL via INTRAVENOUS
  Filled 2021-01-05 (×2): qty 500

## 2021-01-05 MED ORDER — POLYETHYLENE GLYCOL 3350 17 G PO PACK: 17.0000 g | PACK | Freq: Every day | ORAL | Status: DC | PRN

## 2021-01-05 MED ORDER — JEVITY 1.5 CAL/FIBER PO LIQD
1000.0000 mL | ORAL | Status: DC
  Administered 2021-01-05 – 2021-01-06 (×2): 1000 mL
  Filled 2021-01-05 (×3): qty 1000

## 2021-01-05 MED ORDER — SODIUM CHLORIDE 0.9 % IV SOLN
12.0000 g | INTRAVENOUS | Status: DC
  Administered 2021-01-05 – 2021-01-06 (×2): 12 g via INTRAVENOUS
  Filled 2021-01-05 (×3): qty 12000

## 2021-01-05 MED ORDER — VITAL HIGH PROTEIN PO LIQD: 1000.0000 mL | ORAL | Status: DC

## 2021-01-05 MED ORDER — METHADONE HCL 5 MG PO TABS
10.0000 mg | ORAL_TABLET | Freq: Three times a day (TID) | ORAL | Status: DC
Start: 2021-01-05 — End: 2021-01-07
  Administered 2021-01-05 – 2021-01-06 (×5): 10 mg
  Filled 2021-01-05 (×5): qty 2

## 2021-01-05 NOTE — Progress Notes (Signed)
[facility]  Date of Admission:  01-16-2021      Total days of antibiotics 7   Nafcillin 10/07 >> c Cefazolin          ASSESSMENT: Kathryn Medina is a 30 y.o. female with large tricuspid valve vegetation due to MSSA now s/p angiovac procedure 10/06 for percutaneous debulking.   TV endocarditis with large vegetation s/p angiovac debulking 10/06 with successful debridement.   Acute Lt MCA stroke 10/06 PM - moved to ICU overnight for monitoring and neurologic support. Protecting airway currently. In review of her TTE's from August 2022 and again in October 2022 she has new thickening and abnormalities of the aortic valve mentioned. Seems likely she could also have left sided infection and embolization given history of incompletely treated MSSA endocarditis for a few months. Given concern for septic clot will change to Nafcillin for better CNS penetration (at least 2 weeks if we can get her through that).  **Nafcillin is in higher sodium containing IVF - Will need to watch her sodium - noted neurology's plan for close monitoring with q4h sodium levels in the setting of hyponatremia. She is on hypertonic saline currently for edema management of stroke.  Will reach out to [facility] pharmacy team to clarify   Bacteremia - cleared 10/01. Central line in place currently, placed 10/06.   Thrombocytopenia - improving as we treat infection.   Opoid Use Disorder - appreciate cares from primary team to manage this and keep her here on treatment.     PLAN: Change to nafcillin continuous infusion  Follow sodium levels closely  Follow creatinine closely   Dr. Drue Second is on call over the weekend for any ID related concerns. Spoke with and updated her mother at the bedside today.     Active Problems:   Acute infective endocarditis   MSSA bacteremia   Severe opioid use disorder (HCC)   Bacteremia   Pressure injury of skin   Opioid use with withdrawal (HCC)    Cerebral thrombosis with cerebral infarction    Chlorhexidine Gluconate Cloth  6 each Topical Daily   methadone  10 mg Oral TID   nicotine  14 mg Transdermal Daily    SUBJECTIVE: Awake but not verbal. Moving left side only.    Review of Systems: Review of Systems  Unable to perform ROS: Patient nonverbal   Allergies  Allergen Reactions   Other Hives    pork    OBJECTIVE: Vitals:   01/05/21 0800 01/05/21 0900 01/05/21 1000 01/05/21 1100  BP: 114/88 111/79 (!) 121/94 118/87  Pulse: (!) 106 (!) 105 (!) 121 (!) 110  Resp: 20 20 (!) 31 (!) 22  Temp:      TempSrc:      SpO2: 95% 95% 95% 98%  Weight:      Height:       Body mass index is 21.79 kg/m.  Physical Exam Vitals reviewed.  Constitutional:      Appearance: She is well-developed.     Comments: In bed with RN staff performing patient cares/ADLs  HENT:     Mouth/Throat:     Mouth: No oral lesions.     Dentition: Normal dentition. No dental abscesses.     Pharynx: No oropharyngeal exudate.  Cardiovascular:     Rate and Rhythm: Regular rhythm. Tachycardia present.     Heart sounds: Murmur heard.  Pulmonary:     Effort: Pulmonary effort is normal.  Breath sounds: Normal breath sounds.  Abdominal:     General: There is no distension.     Palpations: Abdomen is soft.     Tenderness: There is no abdominal tenderness.  Lymphadenopathy:     Cervical: No cervical adenopathy.  Skin:    General: Skin is warm and dry.     Findings: No rash.  Neurological:     Mental Status: She is alert.     Comments: Nonverbal. Anxious appearing   Psychiatric:        Judgment: Judgment normal.    Lab Results Lab Results  Component Value Date   WBC 17.8 (H) 01/05/2021   HGB 7.3 (L) 01/05/2021   HCT 23.6 (L) 01/05/2021   MCV 80.0 01/05/2021   PLT 207 01/05/2021    Lab Results  Component Value Date   CREATININE 0.44 01/05/2021   BUN 10 01/05/2021   NA 132 (L) 01/05/2021   K 4.2 01/05/2021   CL 103 01/05/2021    CO2 22 01/05/2021    Lab Results  Component Value Date   ALT 15 12/31/2020   AST 33 01/18/2021   ALKPHOS 111 01/25/2021   BILITOT 0.5 01/18/2021     Microbiology: Recent Results (from the past 240 hour(s))  Culture, blood (Routine x 2)     Status: Abnormal   Collection Time: 12/28/2020 12:33 AM   Specimen: BLOOD  Result Value Ref Range Status   Specimen Description BLOOD SITE NOT SPECIFIED  Final   Special Requests   Final    BOTTLES DRAWN AEROBIC AND ANAEROBIC Blood Culture adequate volume   Culture  Setup Time   Final    GRAM POSITIVE COCCI IN CLUSTERS IN BOTH AEROBIC AND ANAEROBIC BOTTLES CRITICAL VALUE NOTED.  VALUE IS CONSISTENT WITH PREVIOUSLY REPORTED AND CALLED VALUE.    Culture (A)  Final    STAPHYLOCOCCUS AUREUS SUSCEPTIBILITIES PERFORMED ON PREVIOUS CULTURE WITHIN THE LAST 5 DAYS. Performed at [facility], [address] [place], [place] [address]    Report Status 12/31/2020 FINAL  Final  Culture, blood (Routine x 2)     Status: Abnormal   Collection Time: 12/01/2020 12:33 AM   Specimen: BLOOD  Result Value Ref Range Status   Specimen Description BLOOD SITE NOT SPECIFIED  Final   Special Requests AEROBIC BOTTLE ONLY Blood Culture adequate volume  Final   Culture  Setup Time   Final    GRAM POSITIVE COCCI IN CLUSTERS AEROBIC BOTTLE ONLY CRITICAL RESULT CALLED TO, READ BACK BY AND VERIFIED WITH: PHARMD E.SINCLAIR AT 1428 ON 12/21/2020 BY T.SAAD. Performed at [facility], [address], [place], [place] [address]    Culture STAPHYLOCOCCUS AUREUS (A)  Final   Report Status 12/31/2020 FINAL  Final   Organism ID, Bacteria STAPHYLOCOCCUS AUREUS  Final      Susceptibility   Staphylococcus aureus - MIC*    CIPROFLOXACIN <=0.5 SENSITIVE Sensitive     ERYTHROMYCIN <=0.25 SENSITIVE Sensitive     GENTAMICIN <=0.5 SENSITIVE Sensitive     OXACILLIN 0.5 SENSITIVE Sensitive     TETRACYCLINE <=1 SENSITIVE Sensitive     VANCOMYCIN 2 SENSITIVE Sensitive      TRIMETH/SULFA <=10 SENSITIVE Sensitive     CLINDAMYCIN <=0.25 SENSITIVE Sensitive     RIFAMPIN <=0.5 SENSITIVE Sensitive     Inducible Clindamycin NEGATIVE Sensitive     * STAPHYLOCOCCUS AUREUS  Blood Culture ID Panel (Reflexed)     Status: Abnormal   Collection Time: 12/06/2020 12:33 AM  Result Value Ref Range Status   Enterococcus faecalis NOT DETECTED NOT DETECTED Final   Enterococcus Faecium NOT DETECTED NOT DETECTED Final   Listeria monocytogenes NOT DETECTED NOT DETECTED Final   Staphylococcus species DETECTED (A) NOT DETECTED Final    Comment: CRITICAL RESULT CALLED TO, READ BACK BY AND VERIFIED WITH: PHARMD E.SINCLAIR AT 1428 ON 12/04/2020 BY T.SAAD.    Staphylococcus aureus (BCID) DETECTED (A) NOT DETECTED Final    Comment: CRITICAL RESULT CALLED TO, READ BACK BY AND VERIFIED WITH: PHARMD E.SINCLAIR AT 1428 ON 12/03/2020 BY T.SAAD.    Staphylococcus epidermidis NOT DETECTED NOT DETECTED Final   Staphylococcus lugdunensis NOT DETECTED NOT DETECTED Final   Streptococcus species NOT DETECTED NOT DETECTED Final   Streptococcus agalactiae NOT DETECTED NOT DETECTED Final   Streptococcus pneumoniae NOT DETECTED NOT DETECTED Final   Streptococcus pyogenes NOT DETECTED NOT DETECTED Final   A.calcoaceticus-baumannii NOT DETECTED NOT DETECTED Final   Bacteroides fragilis NOT DETECTED NOT DETECTED Final   Enterobacterales NOT DETECTED NOT DETECTED Final   Enterobacter cloacae complex NOT DETECTED NOT DETECTED Final   Escherichia coli NOT DETECTED NOT DETECTED Final   Klebsiella aerogenes NOT DETECTED NOT DETECTED Final   Klebsiella oxytoca NOT DETECTED NOT DETECTED Final   Klebsiella pneumoniae NOT DETECTED NOT DETECTED Final   Proteus species NOT DETECTED NOT DETECTED Final   Salmonella species NOT DETECTED NOT DETECTED Final   Serratia marcescens NOT DETECTED NOT DETECTED Final   Haemophilus influenzae NOT DETECTED NOT DETECTED Final   Neisseria meningitidis NOT DETECTED NOT  DETECTED Final   Pseudomonas aeruginosa NOT DETECTED NOT DETECTED Final   Stenotrophomonas maltophilia NOT DETECTED NOT DETECTED Final   Candida albicans NOT DETECTED NOT DETECTED Final   Candida auris NOT DETECTED NOT DETECTED Final   Candida glabrata NOT DETECTED NOT DETECTED Final   Candida krusei NOT DETECTED NOT DETECTED Final   Candida parapsilosis NOT DETECTED NOT DETECTED Final   Candida tropicalis NOT DETECTED NOT DETECTED Final   Cryptococcus neoformans/gattii NOT DETECTED NOT DETECTED Final   Meth resistant mecA/C and MREJ NOT DETECTED NOT DETECTED Final    Comment: Performed at [facility], [address], [place], [place] [address]  Culture, blood (routine x 2)     Status: None   Collection Time: 12/30/20  7:07 AM   Specimen: BLOOD LEFT HAND  Result Value Ref Range Status   Specimen Description BLOOD LEFT HAND  Final   Special Requests   Final    BOTTLES DRAWN AEROBIC ONLY Blood Culture adequate volume   Culture   Final    NO GROWTH 5 DAYS Performed at [facility], [address] [place], [place] [address]    Report Status 01/26/2021 FINAL  Final  Culture, blood (routine x 2)     Status: None   Collection Time: 12/30/20  7:07 AM   Specimen: BLOOD RIGHT HAND  Result Value Ref Range Status   Specimen Description BLOOD RIGHT HAND  Final   Special Requests   Final    BOTTLES DRAWN AEROBIC ONLY Blood Culture adequate volume   Culture   Final    NO GROWTH 5 DAYS Performed at [facility], [address] [place], [place] [address]    Report Status 01/20/2021 FINAL  Final  Aerobic/Anaerobic Culture w Gram Stain (surgical/deep wound)     Status: None (Preliminary result)   Collection Time: 01/03/2021  9:04 AM   Specimen: Soft Tissue, Other  Result Value Ref  Range Status   Specimen Description TISSUE  Final   Special Requests  SOFT TISSUE TRICUSPID  Final   Gram Stain   Final    FEW WBC PRESENT,BOTH PMN AND MONONUCLEAR RARE GRAM POSITIVE COCCI     Culture   Final    RARE STAPHYLOCOCCUS AUREUS CULTURE REINCUBATED FOR BETTER GROWTH Performed at [facility], [address] [place], [place] [address]    Report Status PENDING  Incomplete  MRSA Next Gen by PCR, Nasal     Status: None   Collection Time: 01/13/2021 10:07 PM   Specimen: Nasal Mucosa; Nasal Swab  Result Value Ref Range Status   MRSA by PCR Next Gen NOT DETECTED NOT DETECTED Final    Comment: (NOTE) The GeneXpert MRSA Assay (FDA approved for NASAL specimens only), is one component of a comprehensive MRSA colonization surveillance program. It is not intended to diagnose MRSA infection nor to guide or monitor treatment for MRSA infections. Test performance is not FDA approved in patients less than 62 years old. Performed at [facility], [address], [place], [place] [address]     Rexene Alberts, MSN, NP-C [facility]  [email or web address] Pager: [phone] Office: [phone] [facility] Main Line: [phone]

## 2021-01-05 NOTE — Progress Notes (Signed)
PT Cancellation Note  Patient Details Name: Cheyene Hamric MRN: [id number] DOB: 24-Apr-1990   Cancelled Treatment:    Reason Eval/Treat Not Completed: Patient not medically ready; will attempt again another day.   Elray Mcgregor 01/05/2021, 9:35 AM Sheran Lawless, PT [facility] [phone] Office:[phone] 01/05/2021

## 2021-01-05 NOTE — TOC CAGE-AID Note (Signed)
Transition of Care [facility]) - CAGE-AID Screening   Patient Details  Name: Kathryn Medina MRN: [id number] Date of Birth: 19-Nov-1990  Transition of Care [facility]) CM/SW Contact:    Mahima Hottle C Tarpley-Carter, LCSWA Phone Number: 01/05/2021, 3:49 PM   Clinical Narrative: Pt is unable to participate in Cage Aid.   Khalessi Blough Tarpley-Carter, MSW, LCSW-A Pronouns:  She/Her/Hers [facility] HealthTransitions of Care Clinical Social Worker Direct Number:  [phone] [email or web address]    CAGE-AID Screening: Substance Abuse Screening unable to be completed due to: : Patient unable to participate             Substance Abuse Education Offered: No  Substance abuse interventions: Transport planner

## 2021-01-05 NOTE — Progress Notes (Addendum)
STROKE TEAM PROGRESS NOTE   INTERVAL HISTORY In house code stroke called last night around 6 pm for aphasia and right sided weakness. Patient underwent catheter-based debridement of the tricuspid valve vegetation and right atrial mass with right femoral approach earlier yesterday am with Dr. Cliffton Asters. Evidence of multiple septic pulmonary emboli was reported. Planning TCD bubble study today.  Her mother is at bedside. We discussed her stroke diagnosis, ongoing work up and plan of care. Questions answered.  Vitals:   01/05/21 0500 01/05/21 0600 01/05/21 0700 01/05/21 0800  BP: 105/82 113/82 107/84 114/88  Pulse: (!) 103 (!) 114 (!) 101 (!) 106  Resp: 17 (!) 31 19 20   Temp:      TempSrc:      SpO2: 98% 96% 96% 95%  Weight:      Height:       CBC:  Recent Labs  Lab 01/27/2021 2028 01/05/21 0545  WBC 21.3* 17.8*  HGB 7.5* 7.3*  HCT 25.1* 23.6*  MCV 79.7* 80.0  PLT 208 207   Basic Metabolic Panel:  Recent Labs  Lab 12/30/20 0707 12/31/20 0554 01/16/2021 2028 01/05/21 0545  NA 128*   < > 133* 132*  K 4.2   < > 4.7 4.2  CL 99   < > 102 103  CO2 22   < > 23 22  GLUCOSE 94   < > 124* 104*  BUN 6   < > 8 10  CREATININE 0.47   < > 0.40* 0.44  CALCIUM 7.5*   < > 8.4* 8.2*  MG 1.8  --   --  1.9  PHOS  --   --   --  4.4   < > = values in this interval not displayed.   Lipid Panel: No results for input(s): CHOL, TRIG, HDL, CHOLHDL, VLDL, LDLCALC in the last 168 hours. HgbA1c: No results for input(s): HGBA1C in the last 168 hours. Urine Drug Screen: No results for input(s): LABOPIA, COCAINSCRNUR, LABBENZ, AMPHETMU, THCU, LABBARB in the last 168 hours.  Alcohol Level No results for input(s): ETH in the last 168 hours.  IMAGING past 24 hours DG CHEST PORT 1 VIEW  Result Date: 01/08/2021 CLINICAL DATA:  Dyspnea code stroke EXAM: PORTABLE CHEST 1 VIEW COMPARISON:  Jan 17, 2021, CT 11/21/2020 FINDINGS: Low lung volumes. Probable small right effusion. Right peripheral basilar and left  midlung opacity with suspected cavitation likely representing septic emboli. Left IJ central venous catheter tip over the SVC. Borderline cardiomegaly. No visible pneumothorax. IMPRESSION: 1. Left IJ central venous catheter tip over the SVC. 2. Bilateral nodular opacities some of which appear to contain cavitation and are suspect for septic emboli. Small right pleural effusion Electronically Signed   By: 11/23/2020 M.D.   On: 12/31/2020 21:51   DG C-Arm 1-60 Min-No Report  Result Date: 01/28/2021 Fluoroscopy was utilized by the requesting physician.  No radiographic interpretation.   CT HEAD CODE STROKE WO CONTRAST`  Result Date: 01/17/2021 CLINICAL DATA:  Code stroke. Slurred speech, decreased movement in the right upper extremity, altered mental status EXAM: CT HEAD WITHOUT CONTRAST TECHNIQUE: Contiguous axial images were obtained from the base of the skull through the vertex without intravenous contrast. COMPARISON:  CT head 01/17/2021 FINDINGS: Brain: There is extensive hypodensity in the left MCA distribution involving nearly the entire temporal lobe and portions of the parietal lobe. There is no evidence of acute hemorrhage. There is sulcal effacement but no no significant mass effect associated with the infarct at this time.  The ventricles are normal in size. There is no midline shift at this time. Vascular: There is a dense MCA sign on the left. Skull: Normal. Negative for fracture or focal lesion. Sinuses/Orbits: No acute finding. Other: There is a right mastoid effusion. ASPECTS [facility] Stroke Program Early CT Score) - Ganglionic level infarction (caudate, lentiform nuclei, internal capsule, insula, M1-M3 cortex): 3 - Supraganglionic infarction (M4-M6 cortex): 2 Total score (0-10 with 10 being normal): 5 IMPRESSION: 1. Evolving subacute infarct in the left MCA distribution involving essentially the entire temporal lobe and portion of the parietal lobe. 2. No evidence of hemorrhagic  transformation or significant mass effect or midline shift at this time. 3. ASPECTS is 5 4. Probable hyperdense MCA on the left. These results were called by telephone at the time of interpretation on 01/20/2021 at 9:01 pm to provider Dr Otelia Limes , who verbally acknowledged these results. Electronically Signed   By: Lesia Hausen M.D.   On: 01/28/2021 21:03   CT ANGIO HEAD NECK W WO CM (CODE STROKE)  Result Date: 01/08/2021 CLINICAL DATA:  Initial evaluation for slurred speech, right-sided weakness. EXAM: CT ANGIOGRAPHY HEAD AND NECK TECHNIQUE: Multidetector CT imaging of the head and neck was performed using the standard protocol during bolus administration of intravenous contrast. Multiplanar CT image reconstructions and MIPs were obtained to evaluate the vascular anatomy. Carotid stenosis measurements (when applicable) are obtained utilizing NASCET criteria, using the distal internal carotid diameter as the denominator. CONTRAST:  18mL OMNIPAQUE IOHEXOL 350 MG/ML SOLN COMPARISON:  Head CT from earlier the same day. FINDINGS: CTA NECK FINDINGS Aortic arch: Visualized aortic arch normal in caliber with normal 3 vessel morphology. No stenosis about the origin the great vessels. Right carotid system: Right common and internal carotid arteries patent without stenosis, dissection or occlusion. Left carotid system: Left CCA patent from its origin to the bifurcation without stenosis. No atheromatous narrowing about the left bifurcation. Left ICA diffusely narrowed with increasing attenuation as it courses cephalad towards the skull base, likely related downstream occlusion. Vertebral arteries: Both vertebral arteries arise from the subclavian arteries. Right vertebral artery widely patent within the neck. Left vertebral artery occluded at its origin. Distal reconstitution at the proximal V2 segment. Left vertebral artery otherwise patent distally within the neck. Skeleton: No discrete or worrisome osseous lesions. Other  neck: No other acute soft tissue abnormality within the neck. Upper chest: Layering right pleural effusion partially visualized. Multifocal patchy and ground-glass opacities within the visualized lungs, concerning for multifocal infection/pneumonia. Review of the MIP images confirms the above findings CTA HEAD FINDINGS Anterior circulation: Right ICA widely patent to the terminus. On the left, attenuated flow seen within the petrous and cavernous segments, with acute occlusion at the supraclinoid left ICA (series 8, image 264). Occlusion extends into the left ICA terminus. A1 segments remain patent. Normal anterior communicating artery complex. Anterior cerebral arteries patent to their distal aspects without stenosis. Right M1 segment patent. Normal right MCA bifurcation. There is a acute right M3 occlusion versus severe stenosis (series 9, image 119). Irregular thready flow seen distally. Remainder of the right MCA branches perfused. Mid and distal left M1 segment patent beyond the occlusion. Normal left MCA bifurcation. No visible downstream MCA branch occlusion. Posterior circulation: Both vertebral arteries patent to the vertebrobasilar junction without stenosis. Both PICA origins patent. Basilar patent to its distal aspect without stenosis. Superior cerebellar arteries patent bilaterally. Right PCA primarily supplied via the basilar and is widely patent to its distal aspect. Left PCA  supplied via hypoplastic P1 segment and left posterior communicating artery, severe stenosis at the origin of the left posterior communicating artery. There is occlusion versus severe stenosis involving the left P2 segment (series 9, image 138). Irregular patchy flow seen distally within the left PCA distribution, with probable additional left P4 occlusion (series 9, image 170). Venous sinuses: Grossly patent allowing for timing the contrast bolus. Anatomic variants: None significant. Review of the MIP images confirms the above  findings IMPRESSION: 1. Positive CTA for with acute occlusion at the supraclinoid left ICA occlusion, extending into the left ICA terminus. Fairly robust collateral flow seen distally within the left MCA distribution. 2. Additional acute right M3 occlusion versus severe stenosis. Irregular attenuated flow distally. 3. Occlusion versus severe stenosis involving the left P2 segment. Additional subsequent distal left P4 occlusion. 4. 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 Right vertebral artery widely patent. 5. Layering right pleural effusion with multifocal patchy and ground-glass opacities within the visualized lungs, concerning for multifocal infection/pneumonia. These results were communicated to 9:45 at 10:04 pm on 02/02/2021 by text page via the [facility] messaging system. Electronically Signed   By: Rise Mu M.D.   On: 02/02/2021 22:10    PHYSICAL EXAM Frail cachectic malnourished looking young Caucasian lady.  Restless but not in distress. . Afebrile. Head is nontraumatic. Neck is supple without bruit.    Cardiac exam no murmur or gallop. Lungs are clear to auscultation. Distal pulses are well felt.  Neurological Exam : Patient is drowsy but can be aroused and opens eyes.  She is globally aphasic.  She does not speak any words.  She does not follow 1 simple midline commands consistently.  She has left gaze preference but will look partially to the right.  She blinks to threat on the left but not on the right.  Right lower facial weakness.  Tongue midline.  Spontaneous antigravity movements on the left.  Trace movement in the right lower extremity spontaneously and withdraws to pain.  Right upper extremity is flaccid and no withdrawal to pain.  ASSESSMENT/PLAN Kathryn Medina is an 30 y.o. 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  She left AMA on 9/2 of this year without completing treatment.  She has been taking oral antibiotics per reports. She has had three recent admissions to [facility] for MSSA bacteremia and TV IE with septic pulmonary emboli (8/23, 8/21, 9/2). Patient has left AMA after each admission, last AMA discharge on 9/2. She had been feeling progressively worse over the last week. She has been having increasing shortness of breath, fatigue, palpitations, and fevers up to 103. Last used IV drugs citing fentanyl the day prior to admission in addition to cocaine and marijuana.    During this admission she has been diagnosed with tricuspid valve infective endocarditis with MSSA bacteremia and concern for sepsis. TTE was significant for a large vegetation 2.42 x 2.5 cm and moderate regurgitation, . She is s/p Angiovac debridement of the tricuspid valve performed by Dr. Cliffton Asters on the morning of 10/6. Around 6:30 pm on 10/6 she became acutely nonverbal, not following commands, inability to gaze to the right and with right sided weakness. Code Stroke was called. Not a candidate for thrombectomy due  to multifocal occlusions with good collateral flow and with aspect score of 5, recently received Eliquis.    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   Code Stroke CT head  1. Evolving subacute infarct in the left MCA distribution involving essentially the entire temporal lobe and portion of the parietal lobe. 2. No evidence of hemorrhagic transformation or significant mass effect or midline shift at this time. 3. ASPECTS is  5 4. Probable hyperdense MCA on the left.  CTA head & neck  1. Positive CTA for with acute occlusion at the supraclinoid left ICA occlusion, extending into the left ICA terminus. Fairly robust collateral flow seen distally within the left MCA distribution. 2. Additional acute right M3 occlusion versus severe stenosis. Irregular attenuated flow distally. 3. Occlusion versus severe stenosis involving the left P2 segment.Additional subsequent distal left P4 occlusion. 4. 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 Right vertebral artery widely patent. 5. Layering right pleural effusion with multifocal patchy and ground-glass opacities within the visualized lungs, concerning for multifocal infection/pneumonia Transcranial Doppler Bubble Study: PENDING Repeat Head CT 10/8: PENDING 2D Echo  EF 60-65% 1. Left ventricular ejection fraction, by estimation, is 60 to 65%. The  left ventricle has normal function. The left ventricle has no regional  wall motion abnormalities.   2. Right ventricular systolic function is normal. The right ventricular  size is normal. Tricuspid regurgitation signal is inadequate for assessing PA pressure.   3. The mitral valve is grossly normal. Mild mitral valve regurgitation.  No evidence of mitral stenosis.   4. There is a very large shaggy highly mobile density on the tricuspid valve leaflet consistent with vegetation measuring 2.42 x 2.5cm in diameter. The vegetation move to and fro through the TV.Marland Kitchen The tricuspid  valve is abnormal. Tricuspid valve regurgitation is moderate.   5. The AV leaflets are thicker than what would normally be seen in a 30yo but no obvious vegetation is noted. . The aortic valve is tricuspid. Aortic valve regurgitation is mild. Mild to moderate aortic valve sclerosis/calcification is present, without  any evidence of aortic stenosis.   6. The inferior vena cava is normal in size with  greater than 50%  respiratory variability, suggesting right atrial pressure of 3 mmHg.   7. A small pericardial effusion is present. The pericardial effusion is circumferential.  Conclusion(s)/Recommendation(s): Compared to prior echo, TV vegetation appears larger. Findings concerning for tricuspid valve vegetation MRI is on hold (can obtain if patient requires intubation)   on hold as patient is restless and unable to hold still  LDL, will obtain later  HgbA1c PENDING VTE prophylaxis - ok to start lovenox for DVT PPX now from neuro standpoint     There are no active orders of the following types: Diet, Nourishments.  On Xarelto previously  Ok for aspirin 81mg  now from neuro standpoint. No no clear indications for long-term anticoagulants for septic emboli from endocarditis.   Therapy recommendations:  Pending Disposition:  TBD      Cerebral Edema Hypertonic saline initiated  Monitor sodium q 6 hours  Nafcillin therapy may increase sodium level  Update Head CT in am otherwise Obtain stat head CT for any neurologic decline  Moderate tricuspid valve endocarditis      MSSA bacteremia ID following Switched to Nafcillin continuous infusion todayfor better CNS penetration per ID   BP management   Stable Keep BP less than 180 but gradually normalize in 5-7 days from a neurologic standpoint  Long-term BP goal normotensive  Hyperlipidemia Home meds: None LDL, to be checked later  High intensity statin if indicated  Continue statin at discharge       Glycemic management      Severe malnutrition No hx of DM2, A1C pending RD following, Cortrak placed today, HgbA1c PENDING, goal < 7.0 CBGs Management per primary team, consider  monitoring   Other Stroke Risk Factor Current Cigarette smoker ETOH use, alcohol level 69, will be advised to drink no more than 1 drink a day PolySubstance abuse - UDS:  THC POSITIVE, Cocaine POSITIVE.   Other Active Problems   Hospital day #  7  This patient was seen and evaluated with Dr. Pearlean Brownie. He directed the plan of care.  Shon Hale, NP-C   STROKE MD : Patient with infective bacterial endocarditis involving the tricuspid valve developed sudden onset of aphasia and right hemiparesis following transcatheter procedure for debulking of the vegetation.  TEE prior had shown no right to left shunt hence mechanism most of her multiple cerebral emboli is intriguing.  Her CT scan showed a low aspect score and establish infarct with cytotoxic edema hence she was not considered for thrombectomy and she is not a candidate for thrombolysis as she had received Eliquis earlier that day.  Continue hypertonic saline with serum sodium goal 1 50-1 55 and close neurological monitoring.  Check transcranial Doppler bubble study to look for right-to-left shunt which may have been missed on TEE.  Long discussion with patient's mother at the bedside and answered questions about plan of care and treatment.  Discussed with Dr. Merrily Pew critical care medicine. This patient is critically ill and at significant risk of neurological worsening, death and care requires constant monitoring of vital signs, hemodynamics,respiratory and cardiac monitoring, extensive review of multiple databases, frequent neurological assessment, discussion with family, other specialists and medical decision making of high complexity.I have made any additions or clarifications directly to the above note.This critical care time does not reflect procedure time, or teaching time or supervisory time of PA/NP/Med Resident etc but could involve care discussion time.  I spent 40 minutes of neurocritical care time  in the care of  this patient.  Delia Heady, MD ADDENDUM ; TCD bubble study done at the bedside is strongly positive for large right-to-left shunt.  TEE preprocedure was negative so this is intriguing.  Plan to repeat 2D echo with bubble and if nondiagnostic may need to consider doing  CT angiogram of the chest to look for pulmonary AV fistula.  Discussed with Dr. Merrily Pew, patient's mother and daughter and answered questions. Delia Heady MD To contact Stroke Continuity provider, please refer to [email or web address]. After hours, contact General Neurology

## 2021-01-05 NOTE — Progress Notes (Addendum)
Initial Nutrition Assessment  DOCUMENTATION CODES:   Non-severe (moderate) malnutrition in context of chronic illness  INTERVENTION:   Initiate Jevity 1.5 @ 25 ml/hr via cortrak tube and increase by 10 ml every 8 hours to goal rate of 55 ml/hr.   45 ml Prosource TF daily  Tube feeding regimen provides 2020 kcal (100% of needs), 95 grams of protein, and 1003 ml of H2O.    -Monitor Mg, K, and Phos daily and replete as needed due to high refeeding risk  NUTRITION DIAGNOSIS:   Moderate Malnutrition related to chronic illness (IVDU, polysubstance abuse, endocarditis) as evidenced by mild fat depletion, moderate fat depletion, mild muscle depletion, moderate muscle depletion.  GOAL:   Patient will meet greater than or equal to 90% of their needs  MONITOR:   Diet advancement, Labs, Weight trends, Skin, I & O's  REASON FOR ASSESSMENT:   Malnutrition Screening Tool, Consult Assessment of nutrition requirement/status  ASSESSMENT:   Kathryn Medina is a 30 y.o. with a pertinent PMH of polysubstance use and IV drug use, MSSA infective endocarditis of tricuspid valve, DDD with lumbar radiculopathy, and hepatitis C admitted for suspected MSSA bacteremia in the setting of known tricuspid valve endocarditis.  10/3- s/p TEE- revealed large vegetation and moderate regurgitation 10/6- s/p PROCEDURE:  TRANSESOPHAGEAL ECHOCARDIOGRAM (TEE), APPLICATION OF ANGIOVAC  10/7- s/p BSE- too lethargic to participate at this time, cortrak tube placed (gastric)  Reviewed I/O's: +2.8 L x 24 hours and +14.7 L since admission  Pt now with lt MCA stroke.   Per MD notes, pt with variable intake prior to stroke. Noted meal completions 25-75% on regular diet.   Case discussed with MD, who confirms plan for cortrak tube placement and TF initiation.   No family present at time of visit. Pt opened her eyes when RD greeted her, but did not respond to questions. Pt frequently made jerking movements during the exam  (unsure if they were voluntary or involuntary).   Reviewed wt hx; wt has been stable over the past 2 months.   Medications reviewed and include 3% sodium chloride infusion @ 75 ml/hr.   Labs reviewed: Na: 132.    Diet Order:   Diet Order     None       EDUCATION NEEDS:   No education needs have been identified at this time  Skin:  Skin Assessment: Skin Integrity Issues: Skin Integrity Issues:: Stage I, Incisions Stage I: sacrum Incisions: closed rt neck and rt groin  Last BM:  01/02/21  Height:   Ht Readings from Last 1 Encounters:  12/23/2020 5\' 7"  (1.702 m)    Weight:   Wt Readings from Last 1 Encounters:  12/12/2020 63.1 kg    Ideal Body Weight:  61.4 kg  BMI:  Body mass index is 21.79 kg/m.  Estimated Nutritional Needs:   Kcal:  1900-2100  Protein:  95-110 grams  Fluid:  > 1.9 L    12/31/20, RD, LDN, CDCES Registered Dietitian II Certified Diabetes Care and Education Specialist Please refer to [facility] for RD and/or RD on-call/weekend/after hours pager

## 2021-01-05 NOTE — Progress Notes (Signed)
Patient's family member came to nurse's station and stated she was asking for something to drink. This nurse informed him that the patient was not allowed anything by mouth at this time.  Several moments later, this nurse witnessed family member attempting to provide patient with a capful of Mt. Dew. Ran to room and educated family member that NPO status is for the patient's safety and educated on risk of aspiration.  Family member stated he understood. Will continue to monitor.

## 2021-01-05 NOTE — Plan of Care (Signed)
Sodium increase by 10 points in less than 24 hours as documented below  Hypertonic rate has been 75 cc/hr   Will pause for now and if downtrending on next check resume at a lower rate. Increase frequency of monitoring to q4   Basic Metabolic Panel: Recent Labs  Lab 12/30/20 0707 12/31/20 0554 01/02/21 0103 January 26, 2021 2028 01/05/21 0545 01/05/21 1659  NA 128* 128* 131* 133* 132* 141  K 4.2 4.3 4.4 4.7 4.2  --   CL 99 98 99 102 103  --   CO2 22 20* 22 23 22   --   GLUCOSE 94 96 90 124* 104*  --   BUN 6 5* 6 8 10   --   CREATININE 0.47 0.57 0.64 0.40* 0.44  --   CALCIUM 7.5* 7.8* 7.6* 8.4* 8.2*  --   MG 1.8  --   --   --  1.9  --   PHOS  --   --   --   --  4.4  --     CBC: Recent Labs  Lab 01/02/21 0103 01/03/21 0129 January 26, 2021 0134 01/26/2021 2028 01/05/21 0545  WBC 16.8* 15.2* 15.0* 21.3* 17.8*  HGB 7.6* 7.0* 7.2* 7.5* 7.3*  HCT 25.1* 22.4* 23.9* 25.1* 23.6*  MCV 79.7* 79.2* 80.5 79.7* 80.0  PLT 150 183 190 208 207    Coagulation Studies: No results for input(s): LABPROT, INR in the last 72 hours.

## 2021-01-05 NOTE — Procedures (Signed)
Cortrak  Tube Type:  Cortrak - 43 inches Tube Location:  Left nare Initial Placement:  Stomach Secured by: Bridle Technique Used to Measure Tube Placement:  Marking at nare/corner of mouth Cortrak Secured At:  80 cm   Cortrak Tube Team Note:  Consult received to place a Cortrak feeding tube.   X-ray is required, abdominal x-ray has been ordered by the Cortrak team. Please confirm tube placement before using the Cortrak tube.   If the tube becomes dislodged please keep the tube and contact the Cortrak team at [email or web address] (password [email or web address]) for replacement.  If after hours and replacement cannot be delayed, place a NG tube and confirm placement with an abdominal x-ray.    Demetri Kerman MS, RD, LDN Please refer to [facility] for RD and/or RD on-call/weekend/after hours pager   

## 2021-01-05 NOTE — Progress Notes (Signed)
Paged by RN at 7 PM for change in patient's mental status.  When evaluated bedside, patient was somnolent, aphasic and unable to follow commands.  She has no strength or movement of the right upper extremity.  Left upper extremity strength 3/5.  Also unable to gaze to the right.  She was able to move her lower extremities bilaterally.  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  She had good debulking of the tricuspid valve but there was some residual vegetation.  It was unusual for anesthesia effect to be this prolonged.  She is high risk for CVA due to her endocarditis and large vegetation.  Code stroke was called immediately.  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  Patient was not a candidate for tPA given her recent administration of Xarelto and the size of the stroke.  She also not a candidate for thrombectomy with aspect score of 5.  Patient was transferred to the ICU for hypertonic saline to reduce her intracranial edema.  She was started on normal saline because of hyponatremia to avoid central pontine melanosis.  No permissive hypertension due to high risk of bleeding.  Patient's mother was updated.

## 2021-01-05 NOTE — Progress Notes (Addendum)
eLink Physician-Brief Progress Note Patient Name: Keyari Kleeman DOB: 06-24-1990 MRN: [id number]   Date of Service  01/05/2021  HPI/Events of Note  Multiple issue: 1. Na+ = 142. Goal Na+ = 150-155. However, can't raise Na+ by more than 0.25 meq/L per hour. 2. Multifocal PVC's on bedside monitor.  eICU Interventions  Plan: 3% NaCl IV infusion at 20 mL/hour. BMP and Mg++ level STAT.        Gailyn Crook [place] 01/05/2021, 11:08 PM

## 2021-01-05 NOTE — Progress Notes (Addendum)
      [address]       [place] [address]             [phone]      1 Day Post-Op Procedure(s) (LRB): APPLICATION OF ANGIOVAC (N/A) TRANSESOPHAGEAL ECHOCARDIOGRAM (TEE) (N/A) Subjective: Unresponsive, she did attempt to open her eyes for me. She does respond to tactile stimulation.   Objective: Vital signs in last 24 hours: Temp:  [97.6 F (36.4 C)-98.1 F (36.7 C)] 97.8 F (36.6 C) (10/07 0400) Pulse Rate:  [96-124] 106 (10/07 0800) Cardiac Rhythm: Sinus tachycardia (10/07 0800) Resp:  [11-33] 20 (10/07 0800) BP: (96-127)/(72-95) 114/88 (10/07 0800) SpO2:  [86 %-99 %] 95 % (10/07 0800)     Intake/Output from previous day: 10/06 0701 - 10/07 0700 In: 2871.8 [I.V.:2143.7; IV Piggyback:728.2] Out: 100 [Blood:100] Intake/Output this shift: Total I/O In: 125 [I.V.:125] Out: -   General appearance: appears stated age and pale, unresponsive Neurologic: unresponsive other than babinski reflex and tactile stimulation.  Heart: regular rate and rhythm, S1, S2 normal, no murmur, click, rub or gallop Lungs: clear to auscultation bilaterally Abdomen: soft, non-tender; bowel sounds normal; no masses,  no organomegaly Extremities: extremities normal, atraumatic, no cyanosis or edema  Lab Results: Recent Labs    01/26/2021 2028 01/05/21 0545  WBC 21.3* 17.8*  HGB 7.5* 7.3*  HCT 25.1* 23.6*  PLT 208 207   BMET:  Recent Labs    01/03/2021 2028 01/05/21 0545  NA 133* 132*  K 4.7 4.2  CL 102 103  CO2 23 22  GLUCOSE 124* 104*  BUN 8 10  CREATININE 0.40* 0.44  CALCIUM 8.4* 8.2*    PT/INR: No results for input(s): LABPROT, INR in the last 72 hours. ABG    Component Value Date/Time   PHART 7.444 01/02/2021 2027   HCO3 21.3 01/19/2021 2027   TCO2 21 08/25/2008 1917   ACIDBASEDEF 2.2 (H) 01/06/2021 2027   O2SAT 96.3 01/26/2021 2027   CBG (last 3)  No results for input(s): GLUCAP in the last 72 hours.  Assessment/Plan: S/P Procedure(s)  (LRB): APPLICATION OF ANGIOVAC (N/A) TRANSESOPHAGEAL ECHOCARDIOGRAM (TEE) (N/A)  Large subacute infarct in the left MCA region without evidence of hemorrhagic component. She was not a candidate for tPA. Neuro consulted and following. CCM also following and assisting Tricuspid valve endocarditis with MSSA bacteremia S/p Angiovac with sutures still in place. I will check back later today and try to remove them when she is a little more alert and awake. Continue cefazolin per ID.  Unresponsive with the need for critical meds. SLP evaluated this morning and recommending Cortrak placement. They will continue to follow her as she progresses  Plan: Will attempt to remove suture later today if the patient is more responsive. Continue current care plan.    LOS: 7 days    Sharlene Dory 01/05/2021  Left MCA stroke following angiovac debridement.  No evidence of PFO on previous echos thus surprised that pt had a positive bubble study. Will follow peripherally Continue supportive care  Elektra Wartman O Randolf Sansoucie

## 2021-01-05 NOTE — Progress Notes (Signed)
Patient's mother would like to speak with someone regarding the events on [facility] following procedure yesterday. I have contacted Isabelle Course [facility] who directed me to contact the director of [facility]. Called [facility] and left message for director to contact me.

## 2021-01-05 NOTE — Progress Notes (Signed)
Bubble study was performed, reviewed by Dr. 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  TCTS was consulted, Dr. Pearlean Brownie will speak with cardiology to review TEE     Cheri Fowler MD [facility]monary Critical Care See Amion for pager If no response to pager, please call [phone] until 7pm After 7pm, Please call E-link [phone]

## 2021-01-05 NOTE — Progress Notes (Signed)
TCD w/ bubble study completed.   Please see CV Proc for preliminary results.   Cesareo Vickrey, RDMS, RVT  

## 2021-01-05 NOTE — Progress Notes (Signed)
Patient's Na level was 142, despite orders being for Na goal to be within 150-155. E-link notified by this RN. New orders placed by Dr. Arsenio Loader include resuming 3% NaCl infusion at a rate of 81ml/hr and a STAT BMP and magnesium drawn, as patient is displaying multifocal PVCs. RN will continue to monitor.   Harriett Sine, RN

## 2021-01-05 NOTE — Progress Notes (Signed)
SLP Cancellation Note  Patient Details Name: Kathryn Medina MRN: [id number] DOB: 11/30/1990   Cancelled treatment:       Reason Eval/Treat Not Completed: Patient's level of consciousness. PA in room reporting brief reaction to tactile stimulation, but overall poor alertness limiting pt ability to complete bedside swallow evaluation at this time. Cortrak to be placed this date. Discussed plan for SLP f/u with family, PA and RN.     Avie Echevaria, MA, CCC-SLP Acute Rehabilitation Services Office Number: [phone]  Paulette Blanch 01/05/2021, 8:57 AM

## 2021-01-05 NOTE — Consult Note (Signed)
NAME:  Kathryn Medina, MRN:  [id number], DOB:  Sep 18, 1990, LOS: 7 ADMISSION DATE:  12/12/2020, CONSULTATION DATE:  January 31, 2021 REFERRING MD: Harmon Dun, CHIEF COMPLAINT: Acute stroke  History of Present Illness:  30 year old with polysubstance abuse, MSSA endocarditis, hepatitis C admitted with MSSA bacteremia in the setting of known tricuspid valve endocarditis.  She underwent angio Vac debridement of tricuspid valve on 10/6.  Code stroke called this evening with CT findings suggestive of large left MCA stroke.  She is not a candidate for thrombectomy as she recently received Eliquis.  Neurology consulted and has recommended 3% saline   Significant Hospital Events: Including procedures, antibiotic start and stop dates in addition to other pertinent events   9/30-admit.  On methadone for opioid withdrawal 10/6-angio vac debridement of tricuspid valve, code stroke in left MCA  Interim History / Subjective:  Patient is aphasic, agitated Remained afebrile  Objective   Blood pressure 118/87, pulse (!) 110, temperature 97.8 F (36.6 C), temperature source Oral, resp. rate (!) 22, height 5\' 7"  (1.702 m), weight 63.1 kg, SpO2 98 %.        Intake/Output Summary (Last 24 hours) at 01/05/2021 1154 Last data filed at 01/05/2021 1100 Gross per 24 hour  Intake 1804.96 ml  Output --  Net 1804.96 ml   Filed Weights   12/24/2020 0100 12/24/2020 0108 12/09/2020 2100  Weight: 58.8 kg 52.2 kg 63.1 kg    Examination: Gen:   Critically ill looking young Caucasian female, lying on the bed HEENT: Atraumatic, normocephalic, moist mucous membranes Neck:     No masses; no thyromegaly Neuro: Aphasic, not following commands, eyes open, flicker movement on right side, antigravity on left side Lungs:   Scattered rhonchi, no wheezes crackles CV:         Regular rate and rhythm; no murmurs Abd:      + bowel sounds; soft, non-tender; no palpable masses, no distension Ext:    No edema; adequate peripheral  perfusion Skin:      Warm and dry; no rash  Resolved Hospital Problem list     Assessment & Plan:  Acute massive left MCA stroke Hyponatremia She is not a candidate for thrombectomy due to multifocal occlusions with good collateral flow and with aspect score of 5 Hold antiplatelet and anticoagulation to prevent hemorrhagic conversion She is at risk for developing edema and mass-effect but is not apparent at present Will start patient on 3% saline with bolus to prevent cerebral edema  Tricuspid valve endocarditis, MSSA bacteremia S/p Angiovac debridement Appreciate ID input, cefazolin was changed to nafcillin Tricuspid valve tissue culture is growing gram-positive cocci Repeat blood cultures are negative  Septic pulmonary emboli, pneumonia Continue antibotics.  She is protecting airway for now she is at high risk for decompensation and aspiration  Severe malnutrition Continue dietary supplements  Anemia of critical illness H&H is between 7-8 Closely monitor   Best Practice (right click and "Reselect all SmartList Selections" daily)   Diet/type: NPO start tube feeds DVT prophylaxis: SCD GI prophylaxis: N/A Lines: Central line Foley:  N/A Code Status:  full code Last date of multidisciplinary goals of care discussion [10/7: Patient parents were updated at bedside, decision was to continue full aggressive care.]  Labs   CBC: Recent Labs  Lab 01/02/21 0103 01/03/21 0129 01-31-21 0134 31-Jan-2021 2028 01/05/21 0545  WBC 16.8* 15.2* 15.0* 21.3* 17.8*  HGB 7.6* 7.0* 7.2* 7.5* 7.3*  HCT 25.1* 22.4* 23.9* 25.1* 23.6*  MCV 79.7* 79.2* 80.5 79.7* 80.0  PLT 150 183 190 208 207    Basic Metabolic Panel: Recent Labs  Lab 12/30/20 0707 12/31/20 0554 01/02/21 0103 01/14/2021 2028 01/05/21 0545  NA 128* 128* 131* 133* 132*  K 4.2 4.3 4.4 4.7 4.2  CL 99 98 99 102 103  CO2 22 20* 22 23 22   GLUCOSE 94 96 90 124* 104*  BUN 6 5* 6 8 10   CREATININE 0.47 0.57 0.64 0.40* 0.44   CALCIUM 7.5* 7.8* 7.6* 8.4* 8.2*  MG 1.8  --   --   --  1.9  PHOS  --   --   --   --  4.4   GFR: Estimated Creatinine Clearance: 100 mL/min (by C-G formula based on SCr of 0.44 mg/dL). Recent Labs  Lab 12/24/2020 1405 12/25/2020 1607 12/18/2020 2128 12/30/20 0041 12/31/20 0554 01/03/21 0129 01/02/2021 0134 01/28/2021 2028 01/05/21 0545  WBC  --   --   --  11.7*   < > 15.2* 15.0* 21.3* 17.8*  LATICACIDVEN 1.9 3.2* 2.4* 1.7  --   --   --   --   --    < > = values in this interval not displayed.    Liver Function Tests: Recent Labs  Lab 01/17/2021 2028  AST 33  ALT 15  ALKPHOS 111  BILITOT 0.5  PROT 6.6  ALBUMIN 1.9*   No results for input(s): LIPASE, AMYLASE in the last 168 hours. No results for input(s): AMMONIA in the last 168 hours.  ABG    Component Value Date/Time   PHART 7.444 01/01/2021 2027   PCO2ART 31.5 (L) 01/05/2021 2027   PO2ART 80.9 (L) 01/11/2021 2027   HCO3 21.3 12/30/2020 2027   TCO2 21 08/25/2008 1917   ACIDBASEDEF 2.2 (H) 01/24/2021 2027   O2SAT 96.3 01/12/2021 2027     Coagulation Profile: No results for input(s): INR, PROTIME in the last 168 hours.   Cardiac Enzymes: No results for input(s): CKTOTAL, CKMB, CKMBINDEX, TROPONINI in the last 168 hours.  HbA1C: Hgb A1c MFr Bld  Date/Time Value Ref Range Status  02/20/2016 01:46 PM 4.9 4.8 - 5.6 % Final    Comment:             Pre-diabetes: 5.7 - 6.4          Diabetes: >6.4          Glycemic control for adults with diabetes: <7.0     CBG: No results for input(s): GLUCAP in the last 168 hours.  Critical care time:    Total critical care time: 43 minutes  Performed by: 2028   Critical care time was exclusive of separately billable procedures and treating other patients.   Critical care was necessary to treat or prevent imminent or life-threatening deterioration.   Critical care was time spent personally by me on the following activities: development of treatment plan with  patient and/or surrogate as well as nursing, discussions with consultants, evaluation of patient's response to treatment, examination of patient, obtaining history from patient or surrogate, ordering and performing treatments and interventions, ordering and review of laboratory studies, ordering and review of radiographic studies, pulse oximetry and re-evaluation of patient's condition.   02/22/2016 MD [place] [facility] See Amion for pager If no response to pager, please call [phone] until 7pm After 7pm, Please call E-link [phone]

## 2021-01-06 ENCOUNTER — Inpatient Hospital Stay (HOSPITAL_COMMUNITY): Payer: Medicaid Other

## 2021-01-06 DIAGNOSIS — I6389 Other cerebral infarction: Secondary | ICD-10-CM

## 2021-01-06 LAB — POCT I-STAT 7, (LYTES, BLD GAS, ICA,H+H)
Acid-base deficit: 6 mmol/L — ABNORMAL HIGH (ref 0.0–2.0)
Bicarbonate: 15.3 mmol/L — ABNORMAL LOW (ref 20.0–28.0)
Calcium, Ion: 1.16 mmol/L (ref 1.15–1.40)
HCT: 23 % — ABNORMAL LOW (ref 36.0–46.0)
Hemoglobin: 7.8 g/dL — ABNORMAL LOW (ref 12.0–15.0)
O2 Saturation: 98 %
Patient temperature: 99.4
Potassium: 4.2 mmol/L (ref 3.5–5.1)
Sodium: 150 mmol/L — ABNORMAL HIGH (ref 135–145)
TCO2: 16 mmol/L — ABNORMAL LOW (ref 22–32)
pCO2 arterial: 18.8 mmHg — CL (ref 32.0–48.0)
pH, Arterial: 7.52 — ABNORMAL HIGH (ref 7.350–7.450)
pO2, Arterial: 98 mmHg (ref 83.0–108.0)

## 2021-01-06 LAB — GLUCOSE, CAPILLARY
Glucose-Capillary: 128 mg/dL — ABNORMAL HIGH (ref 70–99)
Glucose-Capillary: 130 mg/dL — ABNORMAL HIGH (ref 70–99)
Glucose-Capillary: 135 mg/dL — ABNORMAL HIGH (ref 70–99)
Glucose-Capillary: 103 mg/dL — ABNORMAL HIGH (ref 70–99)
Glucose-Capillary: 139 mg/dL — ABNORMAL HIGH (ref 70–99)
Glucose-Capillary: 166 mg/dL — ABNORMAL HIGH (ref 70–99)

## 2021-01-06 LAB — HEMOGLOBIN A1C
Hgb A1c MFr Bld: 5.3 % (ref 4.8–5.6)
Mean Plasma Glucose: 105.41 mg/dL

## 2021-01-06 LAB — SODIUM
Sodium: 143 mmol/L (ref 135–145)
Sodium: 146 mmol/L — ABNORMAL HIGH (ref 135–145)
Sodium: 146 mmol/L — ABNORMAL HIGH (ref 135–145)

## 2021-01-06 MED ORDER — ORAL CARE MOUTH RINSE: 15.0000 mL | Freq: Two times a day (BID) | OROMUCOSAL | Status: DC

## 2021-01-06 MED ORDER — CHLORHEXIDINE GLUCONATE 0.12 % MT SOLN
15.0000 mL | Freq: Two times a day (BID) | OROMUCOSAL | Status: DC
  Administered 2021-01-06: 15 mL via OROMUCOSAL

## 2021-01-06 NOTE — Progress Notes (Signed)
  Echocardiogram 2D Echocardiogram has been performed.  Delcie Roch 01/06/2021, 4:37 PM

## 2021-01-06 NOTE — Evaluation (Signed)
Physical Therapy Evaluation Patient Details Name: Kathryn Medina MRN: [id number] DOB: [birth year] Today's Date: 01/06/2021  History of Present Illness  Pt is a 30 y.o. F who was admitted with MSSA bacteremia in the setting of known valve endocarditis. She underwent angio Vac debridement of tricuspid valve on 10/6.  Code stroke called this evening with CT findings suggestive of large left MCA stroke.  She is not a candidate for thrombectomy as she recently received Eliquis.  Neurology consulted and recommended 3% saline. Significant PMH: IV drug use, endocarditis, hepatitis C.  Clinical Impression  Pt admitted with above. Presents with aphasia, cognitive deficits, impaired sitting balance, right sided hemiplegia (R arm weaker than leg), and decreased activity tolerance. On PT evaluation, pt making attempts to verbalize and follows one step commands inconsistently. She is able to don her left sock in supine position with min assist. Requiring up to min assist for bed mobility. Pt returning herself to bed and falling asleep before able to attempt transfer. Suspect good progress pending medical stability. Recommend post acute rehab to address deficits and maximize functional mobility.      Recommendations for follow up therapy are one component of a multi-disciplinary discharge planning process, led by the attending physician.  Recommendations may be updated based on patient status, additional functional criteria and insurance authorization.  Follow Up Recommendations CIR;Supervision/Assistance - 24 hour    Equipment Recommendations  Other (comment) (TBA)    Recommendations for Other Services       Precautions / Restrictions Precautions Precautions: Fall;Other (comment) Precaution Comments: Cortrak, mitts, posey belt, watch RR/BP Restrictions Weight Bearing Restrictions: No      Mobility  Bed Mobility Overal bed mobility: Needs Assistance Bed Mobility: Supine to Sit;Sit to Supine      Supine to sit: Min assist Sit to supine: Min guard   General bed mobility comments: Pt initiating well, light minA at trunk to execute upright. Pt returning her self to bed    Transfers                 General transfer comment: deferred as pt returning herself to bed  Ambulation/Gait                Stairs            Wheelchair Mobility    Modified Rankin (Stroke Patients Only) Modified Rankin (Stroke Patients Only) Pre-Morbid Rankin Score: No symptoms Modified Rankin: Severe disability     Balance Overall balance assessment: Needs assistance Sitting-balance support: Feet supported Sitting balance-Leahy Scale: Fair Sitting balance - Comments: close supervision                                     Pertinent Vitals/Pain Pain Assessment: Faces Faces Pain Scale: No hurt    Home Living Family/patient expects to be discharged to:: Unsure                 Additional Comments: Living with spouse in car PTA    Prior Function Level of Independence: Independent               Hand Dominance        Extremity/Trunk Assessment   Upper Extremity Assessment Upper Extremity Assessment: Defer to OT evaluation    Lower Extremity Assessment Lower Extremity Assessment: RLE deficits/detail RLE Deficits / Details: Did not wiggle toes to command, however, able to pull into full knee flexion actively  Cervical / Trunk Assessment Cervical / Trunk Assessment: Normal  Communication   Communication: Receptive difficulties;Expressive difficulties  Cognition Arousal/Alertness: Awake/alert Behavior During Therapy: Restless Overall Cognitive Status: Difficult to assess Area of Impairment: Following commands                       Following Commands: Follows one step commands inconsistently       General Comments: Pt making attempts to verbalize, will mimic PT ~50% of the time, did follow one verbal command with no gestural  cue to sit edge of bed. Pt husband reports she has been following commands for him      General Comments General comments (skin integrity, edema, etc.): BP 141/108 (RN aware), RR 36-44, SpO2 90-94%, 66-89 bpm    Exercises     Assessment/Plan    PT Assessment Patient needs continued PT services  PT Problem List Decreased strength;Decreased balance;Decreased activity tolerance;Decreased mobility;Decreased cognition;Decreased safety awareness       PT Treatment Interventions DME instruction;Gait training;Functional mobility training;Therapeutic activities;Therapeutic exercise;Balance training;Neuromuscular re-education;Patient/family education;Cognitive remediation    PT Goals (Current goals can be found in the Care Plan section)  Acute Rehab PT Goals Patient Stated Goal: Pt partner would like her to continue to regain right sided strength PT Goal Formulation: With patient/family Time For Goal Achievement: 01/20/21 Potential to Achieve Goals: Good    Frequency Min 4X/week   Barriers to discharge Inaccessible home environment      Co-evaluation               AM-PAC PT "6 Clicks" Mobility  Outcome Measure Help needed turning from your back to your side while in a flat bed without using bedrails?: A Little Help needed moving from lying on your back to sitting on the side of a flat bed without using bedrails?: A Little Help needed moving to and from a bed to a chair (including a wheelchair)?: A Little Help needed standing up from a chair using your arms (e.g., wheelchair or bedside chair)?: A Little Help needed to walk in hospital room?: A Lot Help needed climbing 3-5 steps with a railing? : Total 6 Click Score: 15    End of Session   Activity Tolerance: Patient tolerated treatment well Patient left: in bed;with call bell/phone within reach;with family/visitor present;with restraints reapplied Nurse Communication: Mobility status PT Visit Diagnosis: Difficulty in  walking, not elsewhere classified (R26.2);Hemiplegia and hemiparesis Hemiplegia - Right/Left: Right Hemiplegia - caused by: Cerebral infarction    Time: 1203-1229 PT Time Calculation (min) (ACUTE ONLY): 26 min   Charges:   PT Evaluation $PT Eval Moderate Complexity: 1 Mod PT Treatments $Therapeutic Activity: 8-22 mins        Lillia Pauls, PT, DPT Acute Rehabilitation Services Pager [phone] Office [phone]   Kathryn Medina 01/06/2021, 2:02 PM

## 2021-01-06 NOTE — Progress Notes (Signed)
      [address]       [place] [address]             [phone]      Sutures removed from the right neck and right femoral venous access sites.  Both sites clean and hemostatic.  Gaynelle Arabian, PA-C

## 2021-01-06 NOTE — Progress Notes (Signed)
eLink Physician-Brief Progress Note Patient Name: Kathryn Medina DOB: 30-Dec-1990 MRN: [id number]   Date of Service  01/06/2021  HPI/Events of Note  Respiratory distress - Increased agitation with RR = 40-50 and HR = 140-150. Sat = 97%. Nursing request for anti-anxiety medication.  eICU Interventions  Plan: Portable CXR STAT. ABG STAT. NT suction PRN. Will request that PCCM ground team evaluate the patient at bedside prior to sedation of any kind.     Intervention Category Major Interventions: Other:  Lenell Antu 01/06/2021, 9:52 PM

## 2021-01-06 NOTE — Progress Notes (Signed)
NAME:  Kathryn Medina, MRN:  [id number], DOB:  1990/10/22, LOS: 8 ADMISSION DATE:  12/23/2020, CONSULTATION DATE:  01/01/2021 REFERRING MD: Harmon Dun, CHIEF COMPLAINT: Acute stroke  History of Present Illness:  30 year old with polysubstance abuse, MSSA endocarditis, hepatitis C admitted with MSSA bacteremia in the setting of known tricuspid valve endocarditis.  She underwent angio Vac debridement of tricuspid valve on 10/6.  Code stroke called this evening with CT findings suggestive of large left MCA stroke.  She is not a candidate for thrombectomy as she recently received Eliquis.  Neurology consulted and has recommended 3% saline   Significant Hospital Events: Including procedures, antibiotic start and stop dates in addition to other pertinent events   9/30-admit.  On methadone for opioid withdrawal 10/6-angio vac debridement of tricuspid valve, code stroke in left MCA  Interim History / Subjective:  Patient serum sodium rose from 132-142 in 12 hours, hypertonic saline was stopped last night Patient remain agitated and restless in the bed  Objective   Blood pressure (!) 131/97, pulse 74, temperature 99.2 F (37.3 C), temperature source Oral, resp. rate (!) 33, height 5\' 7"  (1.702 m), weight 62.5 kg, SpO2 94 %.        Intake/Output Summary (Last 24 hours) at 01/06/2021 1140 Last data filed at 01/06/2021 1100 Gross per 24 hour  Intake 2103.92 ml  Output 600 ml  Net 1503.92 ml   Filed Weights   12/27/2020 0108 12/24/2020 2100 01/06/21 0420  Weight: 52.2 kg 63.1 kg 62.5 kg    Examination: Gen:   Critically ill looking young Caucasian female, lying on the bed HEENT: Atraumatic, normocephalic, moist mucous membranes. Cortrak in place Neck:     No masses; no thyromegaly Neuro: Aphasic, not following commands, eyes open, flicker movement on right side, antigravity on left side Lungs:   Scattered rhonchi, no wheezes crackles CV:         Regular rate and rhythm; no murmurs Abd:       + bowel sounds; soft, non-tender; no palpable masses, no distension Ext:    No edema; adequate peripheral perfusion Skin:      Warm and dry; no rash  Resolved Hospital Problem list     Assessment & Plan:  Acute massive left MCA stroke Hyponatremia, improved She was not a candidate for thrombectomy due to multifocal occlusions with good collateral flow and with aspect score of 5 Hold antiplatelet and anticoagulation to prevent hemorrhagic conversion She is at risk for developing edema and mass-effect but is not apparent at present Serum sodium came up from 132-142 overnight, 3% saline was stopped Monitor serum sodium  Probable right to left shunt TEE was done before angio vac and debridement of tricuspid valve showed no shunt or septal defect Yesterday patient had bubble study done, reviewed by neurology, it was consistent with large right-to-left shunt and CT surgery is following and was made aware of this new finding  Tricuspid valve endocarditis, MSSA bacteremia S/p Angiovac debridement Appreciate ID input, cefazolin was changed to nafcillin Tricuspid valve tissue culture is growing gram-positive cocci Repeat blood cultures are negative  Septic pulmonary emboli, pneumonia Continue antibotics.  She is protecting airway for now she is at high risk for decompensation and aspiration  Severe malnutrition Continue dietary supplements  Anemia of critical illness H&H is between 7-8 Closely monitor   Best Practice (right click and "Reselect all SmartList Selections" daily)   Diet/type: Continue tube feeds DVT prophylaxis: SCD GI prophylaxis: N/A Lines: Central line Foley:  N/A Code Status:  full code Last date of multidisciplinary goals of care discussion [10/7: Patient parents were updated at bedside, decision was to continue full aggressive care.]  Labs   CBC: Recent Labs  Lab 01/02/21 0103 01/03/21 0129 01/22/2021 0134 01/21/2021 2028 01/05/21 0545  WBC 16.8* 15.2*  15.0* 21.3* 17.8*  HGB 7.6* 7.0* 7.2* 7.5* 7.3*  HCT 25.1* 22.4* 23.9* 25.1* 23.6*  MCV 79.7* 79.2* 80.5 79.7* 80.0  PLT 150 183 190 208 207    Basic Metabolic Panel: Recent Labs  Lab 12/31/20 0554 01/02/21 0103 01/03/2021 2028 01/05/21 0545 01/05/21 1659 01/05/21 2200 01/05/21 2324 01/06/21 0532  NA 128* 131* 133* 132* 141 142 140 143  K 4.3 4.4 4.7 4.2  --   --  3.9  --   CL 98 99 102 103  --   --  112*  --   CO2 20* 22 23 22   --   --  19*  --   GLUCOSE 96 90 124* 104*  --   --  132*  --   BUN 5* 6 8 10   --   --  12  --   CREATININE 0.57 0.64 0.40* 0.44  --   --  0.44  --   CALCIUM 7.8* 7.6* 8.4* 8.2*  --   --  8.0*  --   MG  --   --   --  1.9  --   --  1.9  --   PHOS  --   --   --  4.4  --   --   --   --    GFR: Estimated Creatinine Clearance: 100 mL/min (by C-G formula based on SCr of 0.44 mg/dL). Recent Labs  Lab 01/03/21 0129 01/19/2021 0134 01/01/2021 2028 01/05/21 0545  WBC 15.2* 15.0* 21.3* 17.8*    Liver Function Tests: Recent Labs  Lab 01/22/2021 2028  AST 33  ALT 15  ALKPHOS 111  BILITOT 0.5  PROT 6.6  ALBUMIN 1.9*   No results for input(s): LIPASE, AMYLASE in the last 168 hours. No results for input(s): AMMONIA in the last 168 hours.  ABG    Component Value Date/Time   PHART 7.444 01/17/2021 2027   PCO2ART 31.5 (L) 01/17/2021 2027   PO2ART 80.9 (L) 01/09/2021 2027   HCO3 21.3 01/24/2021 2027   TCO2 21 08/25/2008 1917   ACIDBASEDEF 2.2 (H) 01/28/2021 2027   O2SAT 96.3 01/26/2021 2027     Coagulation Profile: No results for input(s): INR, PROTIME in the last 168 hours.   Cardiac Enzymes: No results for input(s): CKTOTAL, CKMB, CKMBINDEX, TROPONINI in the last 168 hours.  HbA1C: Hgb A1c MFr Bld  Date/Time Value Ref Range Status  01/06/2021 05:32 AM 5.3 4.8 - 5.6 % Final    Comment:    (NOTE) Pre diabetes:          5.7%-6.4%  Diabetes:              >6.4%  Glycemic control for   <7.0% adults with diabetes   02/20/2016 01:46 PM 4.9  4.8 - 5.6 % Final    Comment:             Pre-diabetes: 5.7 - 6.4          Diabetes: >6.4          Glycemic control for adults with diabetes: <7.0     CBG: Recent Labs  Lab 01/05/21 1930 01/05/21 2332 01/06/21 0320 01/06/21 0802 01/06/21 1124  GLUCAP  115* 121* 128* 130* 135*    Critical care time:    Total critical care time: 36 minutes  Performed by: Cheri Fowler   Critical care time was exclusive of separately billable procedures and treating other patients.   Critical care was necessary to treat or prevent imminent or life-threatening deterioration.   Critical care was time spent personally by me on the following activities: development of treatment plan with patient and/or surrogate as well as nursing, discussions with consultants, evaluation of patient's response to treatment, examination of patient, obtaining history from patient or surrogate, ordering and performing treatments and interventions, ordering and review of laboratory studies, ordering and review of radiographic studies, pulse oximetry and re-evaluation of patient's condition.   Cheri Fowler MD [facility] See Amion for pager If no response to pager, please call [phone] until 7pm After 7pm, Please call E-link [phone]

## 2021-01-06 NOTE — Progress Notes (Signed)
STROKE TEAM PROGRESS NOTE   INTERVAL HISTORY No acute events She is more alert and trying to speak today. Family reports she has said one or two words. She mimics for exam some of the time. Right side with improved strength as well. Nursing has had to add a mitten on the right hand.  Significant other and father at bedside. Update provided at the bedside.  Long discussion with father later regarding plan of care, complicated medical scenario including life threatening infection and and risk for worsening from a neurologic perspective.  Questions were answered. Vitals:   01/06/21 1000 01/06/21 1100 01/06/21 1200 01/06/21 1300  BP: 129/86 (!) 131/97 (!) 136/115 140/89  Pulse: 83 74 74 81  Resp: (!) 36 (!) 33 (!) 32 (!) 29  Temp:   99.3 F (37.4 C)   TempSrc:   Axillary   SpO2: 94% 94% 99% 100%  Weight:      Height:       CBC:  Recent Labs  Lab 01/20/2021 2028 01/05/21 0545  WBC 21.3* 17.8*  HGB 7.5* 7.3*  HCT 25.1* 23.6*  MCV 79.7* 80.0  PLT 208 207   Basic Metabolic Panel:  Recent Labs  Lab 01/05/21 0545 01/05/21 1659 01/05/21 2324 01/06/21 0532  NA 132*   < > 140 143  K 4.2  --  3.9  --   CL 103  --  112*  --   CO2 22  --  19*  --   GLUCOSE 104*  --  132*  --   BUN 10  --  12  --   CREATININE 0.44  --  0.44  --   CALCIUM 8.2*  --  8.0*  --   MG 1.9  --  1.9  --   PHOS 4.4  --   --   --    < > = values in this interval not displayed.   Lipid Panel: No results for input(s): CHOL, TRIG, HDL, CHOLHDL, VLDL, LDLCALC in the last 168 hours. HgbA1c:  Recent Labs  Lab 01/06/21 0532  HGBA1C 5.3   Urine Drug Screen: No results for input(s): LABOPIA, COCAINSCRNUR, LABBENZ, AMPHETMU, THCU, LABBARB in the last 168 hours.  Alcohol Level No results for input(s): ETH in the last 168 hours.  IMAGING past 24 hours CT HEAD WO CONTRAST ( )  Result Date: 01/06/2021 CLINICAL DATA:  30 year old female with left ICA terminus occlusion. Large left MCA and PCA territory infarcts.  EXAM: CT HEAD WITHOUT CONTRAST TECHNIQUE: Contiguous axial images were obtained from the base of the skull through the vertex without intravenous contrast. COMPARISON:  Head CT 01/06/2021. FINDINGS: Brain: Confluent cytotoxic edema now throughout the posterior left hemisphere including confluent left PCA territory involvement, confluent posterior left MCA and patchy middle left MCA territory involvement at the insula and operculum. There is patchy left deep gray nuclei involvement. No hemorrhagic transformation identified, but mass effect now on the left lateral ventricle and 3-4 mm of rightward midline shift. Basilar cisterns remain patent. Artifact suspected in the brainstem (series 2, images 10 and 12) with other posterior fossa gray-white matter differentiation preserved. Right hemisphere gray-white matter differentiation preserved. Vascular: Mild Calcified atherosclerosis at the skull base. Skull: No acute osseous abnormality identified. Sinuses/Orbits: Visualized paranasal sinuses and mastoids are stable and well aerated. Other: Left nasoenteric tube now in place. Small volume fluid in the nasopharynx. Visualized orbits and scalp soft tissues are within normal limits. IMPRESSION: 1. Large evolving Left PCA and middle to posterior Left MCA  infarctions. Confluent cytotoxic edema with interval increased intracranial mass effect, now 3-4 mm of rightward midline shift. But no hemorrhagic transformation. 2. Artifact suspected in the brainstem on this exam. No definite additional cytotoxic edema. 3. Left nasoenteric tube now in place. Electronically Signed   By: Odessa Fleming M.D.   On: 01/06/2021 05:26   DG Abd Portable 1V  Result Date: 01/05/2021 CLINICAL DATA:  Nasogastric tube placement. EXAM: PORTABLE ABDOMEN - 1 VIEW COMPARISON:  Chest x-ray 01/05/2021 FINDINGS: Interval placement of an enteric tube overlying the expected region of the gastric antrum/pylorus. Stable left internal jugular central venous catheter  with tip overlying the expected region of the superior cavoatrial junction. The bowel gas pattern is normal. No radio-opaque calculi or other significant radiographic abnormality are seen. Bilateral lower lung zone and mid lung zone airspace opacities are again noted and better evaluated on chest x-ray 01/05/2021. IMPRESSION: Interval placement of an enteric tube overlying the expected region of the gastric antrum/pylorus. Electronically Signed   By: Tish Frederickson M.D.   On: 01/05/2021 15:11   VAS [place] TRANSCRANIAL DOPPLER W BUBBLES  Result Date: 01/05/2021  Transcranial Doppler with Bubble Patient Name:  [facility]  Date of Exam:   01/05/2021 Medical Rec #: [id number]         Accession #:    [id number] Date of Birth: May 27, 1990          Patient Gender: F Patient Age:   30 years Exam Location:  [facility] Procedure:      VAS [place] TRANSCRANIAL DOPPLER W BUBBLES Referring Phys: Shon Hale --------------------------------------------------------------------------------  Indications: Stroke. History: No prior studies. Limitations: Involuntary patient movement. Comparison Study: 21-Jan-2021 Echo intraoperative TEE                    11-24-2020 Echo TEE Performing Technologist: Jean Rosenthal RDMS, RVT  Examination Guidelines: A complete evaluation includes B-mode imaging, spectral Doppler, color Doppler, and power Doppler as needed of all accessible portions of each vessel. Bilateral testing is considered an integral part of a complete examination. Limited examinations for reoccurring indications may be performed as noted.  Summary:  A vascular evaluation was performed. The right middle cerebral artery was studied. An IV was inserted into the patient's left forearm. Verbal informed consent was obtained.  A full curtain of high intensity transient signals (HITS) was observed both at rest and with valsalva maneuver, indicating a Spencer Grade 5 patent foramen ovale (PFO). *See table(s) above for TCD  measurements and observations.    Preliminary     PHYSICAL EXAM Frail cachectic malnourished looking young Caucasian lady.  Restless but not in distress. . Afebrile. Head is nontraumatic. Neck is supple without bruit.    Cardiac exam no murmur or gallop. Lungs are clear to auscultation. Distal pulses are well felt.  Neurological Exam : Patient is drowsy but can be aroused and opens eyes.  She is globally aphasic.  She does not speak any words.  She does not follow 1 simple midline commands consistently.  She has left gaze preference but will look partially to the right.  She blinks to threat on the left but not on the right.  Right lower facial weakness.  Tongue midline.  Spontaneous antigravity movements on the left.  Trace movement in the right lower extremity spontaneously and withdraws to pain.  Right upper extremity is flaccid and no withdrawal to pain.  ASSESSMENT/PLAN Shey Yott is an 31 y.o. 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  She left AMA on 9/2 of this year without completing treatment.  She has been taking oral antibiotics per reports. She has had three recent admissions to [facility] for MSSA bacteremia and TV IE with septic pulmonary emboli (8/23, 8/21, 9/2). Patient has left AMA after each admission, last AMA discharge on 9/2. She had been feeling progressively worse over the last week. She has been having increasing shortness of breath, fatigue, palpitations, and fevers up to 103. Last used IV drugs citing fentanyl the day prior to admission in addition to cocaine and marijuana.    During this admission she has been diagnosed with tricuspid valve infective endocarditis with MSSA bacteremia and concern for sepsis. TTE was significant for a large  vegetation 2.42 x 2.5 cm and moderate regurgitation, . She is s/p Angiovac debridement of the tricuspid valve performed by Dr. Cliffton Asters on the morning of 10/6. Around 6:30 pm on 10/6 she became acutely nonverbal, not following commands, inability to gaze to the right and with right sided weakness. Code Stroke was called. Not a candidate for thrombectomy due to multifocal occlusions with good collateral flow and with aspect score of 5, recently received Eliquis.   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   Code Stroke CT head  1. Evolving subacute infarct in the left MCA distribution involving essentially the entire temporal lobe and portion of the parietal lobe. 2. No evidence of hemorrhagic transformation or significant mass effect or midline shift at this time. 3. ASPECTS is 5 4. Probable hyperdense MCA on the left.  CTA head & neck  1. Positive CTA for with acute occlusion at the supraclinoid left ICA occlusion, extending into the left ICA terminus. Fairly robust collateral flow seen distally within the left MCA distribution. 2. Additional acute right M3 occlusion versus severe stenosis. Irregular attenuated flow distally. 3. Occlusion versus severe stenosis involving the left P2 segment.Additional subsequent distal left P4 occlusion. 4. 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 Right vertebral artery widely patent. 5. Layering right pleural effusion with multifocal patchy and ground-glass opacities within the visualized lungs, concerning for multifocal infection/pneumonia Transcranial Doppler Bubble Study:  TCD bubble study done at the bedside is strongly  positive for large right-to-left shunt.   Plan to repeat 2D echo with bubble PENDING and if nondiagnostic may need to consider doing CT angiogram of the chest to look for pulmonary AV fistula.   Repeat Head CT 10/8:  Large evolving Left PCA and middle to posterior Left MCA infarctions.Confluent cytotoxic edema with interval increased intracranial mass effect, now 3-4 mm of rightward midline shift. But no hemorrhagic transformation. 2. Artifact suspected in the brainstem on this exam. No definite additional cytotoxic edema 2D Echo  EF 60-65% 1. Left ventricular ejection fraction, by estimation, is 60 to 65%. The  left ventricle has normal function. The left ventricle has no regional  wall motion abnormalities.   2. Right ventricular systolic function is normal. The right ventricular  size is normal. Tricuspid  regurgitation signal is inadequate for assessing PA pressure.   3. The mitral valve is grossly normal. Mild mitral valve regurgitation.  No evidence of mitral stenosis.   4. There is a very large shaggy highly mobile density on the tricuspid valve leaflet consistent with vegetation measuring 2.42 x 2.5cm in diameter. The vegetation move to and fro through the TV.Marland Kitchen The tricuspid  valve is abnormal. Tricuspid valve regurgitation is moderate.   5. The AV leaflets are thicker than what would normally be seen in a 30yo but no obvious vegetation is noted. . The aortic valve is tricuspid. Aortic valve regurgitation is mild. Mild to moderate aortic valve sclerosis/calcification is present, without  any evidence of aortic stenosis.   6. The inferior vena cava is normal in size with greater than 50%  respiratory variability, suggesting right atrial pressure of 3 mmHg.   7. A small pericardial effusion is present. The pericardial effusion is circumferential.  Conclusion(s)/Recommendation(s): Compared to prior echo, TV vegetation appears larger. Findings concerning for tricuspid valve vegetation MRI  is on hold (can obtain if patient requires intubation)   on hold as patient is restless and unable to hold still  LDL, will obtain later  HgbA1c 5.3 VTE prophylaxis - ok to start lovenox for DVT PPX now from neuro standpoint     There are no active orders of the following types: Diet, Nourishments.  On Xarelto previously  Ok for aspirin 81mg  now from neuro standpoint. No clear indications for long-term anticoagulants for septic emboli from endocarditis.   Therapy recommendations:  Pending Disposition:  TBD      Cerebral Edema      Hyponatremia Hypertonic saline initiated with sodium goal 150-155 Monitor sodium q 6 hours Na  1141->142->140->143 Appreciate CCM assistance Nafcillin therapy may increase sodium level  Serum sodium came up from 132-142 overnight, 3% saline was stopped then restarted  CT today with worsening edema Obtain stat head CT for any neurologic decline       Moderate tricuspid valve endocarditis      MSSA bacteremia ID following Switched to Nafcillin continuous infusion todayfor better CNS penetration per ID   BP management   Stable Keep BP less than 180  Long-term BP goal normotensive  Hyperlipidemia Home meds: None LDL, to be checked later  High intensity statin if indicated  Continue statin at discharge       Glycemic management      Severe malnutrition No hx of DM2, A1C wnl RD following, Cortrak placed 10/7 HgbA1c , goal < 7.0 CBGs Management per primary team, consider  monitoring   Other Stroke Risk Factor Current Cigarette smoker ETOH use, alcohol level 69, will be advised to drink no more than 1 drink a day PolySubstance abuse - UDS:  THC POSITIVE, Cocaine POSITIVE, currently on methadone   Other Active Problems   Hospital day # 8  This patient was seen and evaluated with Dr. 12/7. He directed the plan of care.  Viviann Spare, NP-C    To contact Stroke Continuity provider, please refer to Shon Hale. After hours, contact  General Neurology

## 2021-01-07 ENCOUNTER — Inpatient Hospital Stay (HOSPITAL_COMMUNITY): Payer: Medicaid Other

## 2021-01-07 DIAGNOSIS — I6330 Cerebral infarction due to thrombosis of unspecified cerebral artery: Secondary | ICD-10-CM

## 2021-01-07 DIAGNOSIS — Z95828 Presence of other vascular implants and grafts: Secondary | ICD-10-CM

## 2021-01-07 DIAGNOSIS — R7881 Bacteremia: Secondary | ICD-10-CM

## 2021-01-07 DIAGNOSIS — J9600 Acute respiratory failure, unspecified whether with hypoxia or hypercapnia: Secondary | ICD-10-CM

## 2021-01-07 DIAGNOSIS — F1193 Opioid use, unspecified with withdrawal: Secondary | ICD-10-CM

## 2021-01-07 DIAGNOSIS — B9561 Methicillin susceptible Staphylococcus aureus infection as the cause of diseases classified elsewhere: Secondary | ICD-10-CM

## 2021-01-07 DIAGNOSIS — I330 Acute and subacute infective endocarditis: Secondary | ICD-10-CM

## 2021-01-07 DIAGNOSIS — J9601 Acute respiratory failure with hypoxia: Secondary | ICD-10-CM

## 2021-01-07 LAB — SODIUM: Sodium: 148 mmol/L — ABNORMAL HIGH (ref 135–145)

## 2021-01-07 LAB — GLUCOSE, CAPILLARY
Glucose-Capillary: 66 mg/dL — ABNORMAL LOW (ref 70–99)
Glucose-Capillary: 155 mg/dL — ABNORMAL HIGH (ref 70–99)

## 2021-01-07 LAB — POCT I-STAT 7, (LYTES, BLD GAS, ICA,H+H)
Acid-base deficit: 20 mmol/L — ABNORMAL HIGH (ref 0.0–2.0)
Acid-base deficit: 16 mmol/L — ABNORMAL HIGH (ref 0.0–2.0)
Bicarbonate: 8.9 mmol/L — ABNORMAL LOW (ref 20.0–28.0)
Bicarbonate: 10.2 mmol/L — ABNORMAL LOW (ref 20.0–28.0)
Calcium, Ion: 1.13 mmol/L — ABNORMAL LOW (ref 1.15–1.40)
Calcium, Ion: 0.97 mmol/L — ABNORMAL LOW (ref 1.15–1.40)
HCT: 33 % — ABNORMAL LOW (ref 36.0–46.0)
HCT: 25 % — ABNORMAL LOW (ref 36.0–46.0)
Hemoglobin: 11.2 g/dL — ABNORMAL LOW (ref 12.0–15.0)
Hemoglobin: 8.5 g/dL — ABNORMAL LOW (ref 12.0–15.0)
O2 Saturation: 100 %
O2 Saturation: 99 %
Patient temperature: 100.1
Patient temperature: 97.5
Potassium: 5.5 mmol/L — ABNORMAL HIGH (ref 3.5–5.1)
Potassium: 5.8 mmol/L — ABNORMAL HIGH (ref 3.5–5.1)
Sodium: 151 mmol/L — ABNORMAL HIGH (ref 135–145)
Sodium: 149 mmol/L — ABNORMAL HIGH (ref 135–145)
TCO2: 10 mmol/L — ABNORMAL LOW (ref 22–32)
TCO2: 11 mmol/L — ABNORMAL LOW (ref 22–32)
pCO2 arterial: 31 mmHg — ABNORMAL LOW (ref 32.0–48.0)
pCO2 arterial: 25.5 mmHg — ABNORMAL LOW (ref 32.0–48.0)
pH, Arterial: 7.07 — CL (ref 7.350–7.450)
pH, Arterial: 7.206 — ABNORMAL LOW (ref 7.350–7.450)
pO2, Arterial: 419 mmHg — ABNORMAL HIGH (ref 83.0–108.0)
pO2, Arterial: 190 mmHg — ABNORMAL HIGH (ref 83.0–108.0)

## 2021-01-07 LAB — HEMOGLOBIN A1C
Hgb A1c MFr Bld: 5.3 % (ref 4.8–5.6)
Mean Plasma Glucose: 105.41 mg/dL

## 2021-01-07 LAB — CBC
HCT: 26.7 % — ABNORMAL LOW (ref 36.0–46.0)
Hemoglobin: 7.4 g/dL — ABNORMAL LOW (ref 12.0–15.0)
MCH: 25 pg — ABNORMAL LOW (ref 26.0–34.0)
MCHC: 27.7 g/dL — ABNORMAL LOW (ref 30.0–36.0)
MCV: 90.2 fL (ref 80.0–100.0)
Platelets: 301 10*3/uL (ref 150–400)
RBC: 2.96 MIL/uL — ABNORMAL LOW (ref 3.87–5.11)
RDW: 24.3 % — ABNORMAL HIGH (ref 11.5–15.5)
WBC: 43.8 10*3/uL — ABNORMAL HIGH (ref 4.0–10.5)
nRBC: 1.8 % — ABNORMAL HIGH (ref 0.0–0.2)

## 2021-01-07 LAB — LACTIC ACID, PLASMA
Lactic Acid, Venous: >9 mmol/L (ref 0.5–1.9)
Lactic Acid, Venous: >9 mmol/L (ref 0.5–1.9)

## 2021-01-07 LAB — CORTISOL
Cortisol, Plasma: 38.1 ug/dL
Cortisol, Plasma: 45.8 ug/dL

## 2021-01-07 LAB — MAGNESIUM: Magnesium: 2.7 mg/dL — ABNORMAL HIGH (ref 1.7–2.4)

## 2021-01-07 MED ORDER — SUCCINYLCHOLINE CHLORIDE 200 MG/10ML IV SOSY
PREFILLED_SYRINGE | INTRAVENOUS | Status: AC
  Filled 2021-01-07: qty 10

## 2021-01-07 MED ORDER — LACTATED RINGERS IV SOLN: INTRAVENOUS | Status: DC

## 2021-01-07 MED ORDER — SODIUM ZIRCONIUM CYCLOSILICATE 10 G PO PACK
10.0000 g | PACK | Freq: Once | ORAL | Status: DC
  Filled 2021-01-07: qty 1

## 2021-01-07 MED ORDER — CALCIUM GLUCONATE-NACL 1-0.675 GM/50ML-% IV SOLN: 1.0000 g | Freq: Once | INTRAVENOUS | Status: AC

## 2021-01-07 MED ORDER — EPINEPHRINE HCL 5 MG/250ML IV SOLN IN NS
0.5000 ug/min | INTRAVENOUS | Status: DC
Start: 2021-01-07 — End: 2021-01-07

## 2021-01-07 MED ORDER — ETOMIDATE 2 MG/ML IV SOLN
INTRAVENOUS | Status: AC
  Filled 2021-01-07: qty 10

## 2021-01-07 MED ORDER — ALBUTEROL SULFATE (2.5 MG/3ML) 0.083% IN NEBU
INHALATION_SOLUTION | RESPIRATORY_TRACT | Status: AC
  Filled 2021-01-07: qty 18

## 2021-01-07 MED ORDER — PROPOFOL 10 MG/ML IV BOLUS
INTRAVENOUS | Status: AC
  Administered 2021-01-07: 100 mg via INTRAVENOUS
  Filled 2021-01-07: qty 20

## 2021-01-07 MED ORDER — FENTANYL CITRATE PF 50 MCG/ML IJ SOSY: 100.0000 ug | PREFILLED_SYRINGE | Freq: Once | INTRAMUSCULAR | Status: AC

## 2021-01-07 MED ORDER — CALCIUM GLUCONATE-NACL 1-0.675 GM/50ML-% IV SOLN
INTRAVENOUS | Status: AC
  Administered 2021-01-07: 1000 mg
  Filled 2021-01-07: qty 50

## 2021-01-07 MED ORDER — SODIUM BICARBONATE 8.4 % IV SOLN
100.0000 meq | Freq: Once | INTRAVENOUS | Status: AC
  Administered 2021-01-07: 100 meq via INTRAVENOUS

## 2021-01-07 MED ORDER — INSULIN ASPART 100 UNIT/ML IJ SOLN
10.0000 [IU] | Freq: Once | INTRAMUSCULAR | Status: AC
  Administered 2021-01-07: 10 [IU] via INTRAVENOUS

## 2021-01-07 MED ORDER — NALOXONE HCL 0.4 MG/ML IJ SOLN
INTRAMUSCULAR | Status: AC
  Filled 2021-01-07: qty 1

## 2021-01-07 MED ORDER — SODIUM BICARBONATE 8.4 % IV SOLN
INTRAVENOUS | Status: AC
  Filled 2021-01-07: qty 100

## 2021-01-07 MED ORDER — FENTANYL BOLUS VIA INFUSION
50.0000 ug | INTRAVENOUS | Status: DC | PRN
  Filled 2021-01-07: qty 100

## 2021-01-07 MED ORDER — SODIUM BICARBONATE 8.4 % IV SOLN
INTRAVENOUS | Status: AC
  Filled 2021-01-07: qty 50

## 2021-01-07 MED ORDER — PROPOFOL 1000 MG/100ML IV EMUL: 100.0000 mg | Freq: Once | INTRAVENOUS | Status: AC

## 2021-01-07 MED ORDER — INSULIN ASPART 100 UNIT/ML IJ SOLN: 0.0000 [IU] | INTRAMUSCULAR | Status: DC

## 2021-01-07 MED ORDER — HYDROCORTISONE SOD SUC (PF) 100 MG IJ SOLR: 100.0000 mg | Freq: Three times a day (TID) | INTRAMUSCULAR | Status: DC

## 2021-01-07 MED ORDER — SODIUM CHLORIDE 0.9% IV SOLUTION: Freq: Once | INTRAVENOUS | Status: DC

## 2021-01-07 MED ORDER — PROPOFOL 1000 MG/100ML IV EMUL: 0.0000 ug/kg/min | INTRAVENOUS | Status: DC

## 2021-01-07 MED ORDER — MIDAZOLAM HCL 2 MG/2ML IJ SOLN
INTRAMUSCULAR | Status: AC
  Filled 2021-01-07: qty 4

## 2021-01-07 MED ORDER — SODIUM BICARBONATE 8.4 % IV SOLN
100.0000 meq | Freq: Once | INTRAVENOUS | Status: AC
  Administered 2021-01-07: 100 meq via INTRAVENOUS
  Filled 2021-01-07: qty 50

## 2021-01-07 MED ORDER — FENTANYL CITRATE PF 50 MCG/ML IJ SOSY
PREFILLED_SYRINGE | INTRAMUSCULAR | Status: AC
  Administered 2021-01-07: 100 ug via INTRAVENOUS
  Filled 2021-01-07: qty 2

## 2021-01-07 MED ORDER — IPRATROPIUM-ALBUTEROL 0.5-2.5 (3) MG/3ML IN SOLN: 3.0000 mL | Freq: Four times a day (QID) | RESPIRATORY_TRACT | Status: DC

## 2021-01-07 MED ORDER — DOCUSATE SODIUM 50 MG/5ML PO LIQD
100.0000 mg | Freq: Two times a day (BID) | ORAL | Status: DC
  Administered 2021-01-07: 100 mg

## 2021-01-07 MED ORDER — DEXTROSE 50 % IV SOLN
INTRAVENOUS | Status: AC
  Administered 2021-01-07: 50 mL via INTRAVENOUS
  Filled 2021-01-07: qty 50

## 2021-01-07 MED ORDER — ALBUTEROL SULFATE (2.5 MG/3ML) 0.083% IN NEBU
INHALATION_SOLUTION | RESPIRATORY_TRACT | Status: AC
  Filled 2021-01-07: qty 3

## 2021-01-07 MED ORDER — PROPOFOL 10 MG/ML IV BOLUS
100.0000 mg | Freq: Once | INTRAVENOUS | Status: AC
  Administered 2021-01-07: 100 mg via INTRAVENOUS

## 2021-01-07 MED ORDER — VASOPRESSIN 20 UNITS/100 ML INFUSION FOR SHOCK
0.0000 [IU]/min | INTRAVENOUS | Status: DC
  Administered 2021-01-07: 0.03 [IU]/min via INTRAVENOUS
  Filled 2021-01-07: qty 100

## 2021-01-07 MED ORDER — SODIUM BICARBONATE 8.4 % IV SOLN
INTRAVENOUS | Status: DC
  Filled 2021-01-07: qty 1000

## 2021-01-07 MED ORDER — DEXTROSE 50 % IV SOLN
12.5000 g | INTRAVENOUS | Status: AC
  Administered 2021-01-07: 12.5 g via INTRAVENOUS
  Filled 2021-01-07: qty 50

## 2021-01-07 MED ORDER — PANTOPRAZOLE SODIUM 40 MG IV SOLR: 40.0000 mg | Freq: Every day | INTRAVENOUS | Status: DC

## 2021-01-07 MED ORDER — POLYETHYLENE GLYCOL 3350 17 G PO PACK: 17.0000 g | PACK | Freq: Every day | ORAL | Status: DC

## 2021-01-07 MED ORDER — ROCURONIUM BROMIDE 10 MG/ML (PF) SYRINGE
PREFILLED_SYRINGE | INTRAVENOUS | Status: AC
  Filled 2021-01-07: qty 10

## 2021-01-07 MED ORDER — FENTANYL 2500MCG IN NS 250ML (10MCG/ML) PREMIX INFUSION
0.0000 ug/h | INTRAVENOUS | Status: DC
  Administered 2021-01-07: 100 ug/h via INTRAVENOUS
  Administered 2021-01-07: 50 ug/h via INTRAVENOUS
  Filled 2021-01-07 (×2): qty 250

## 2021-01-07 MED ORDER — PROPOFOL 1000 MG/100ML IV EMUL
INTRAVENOUS | Status: AC
  Administered 2021-01-07: 20 ug/kg/min via INTRAVENOUS
  Filled 2021-01-07: qty 100

## 2021-01-07 MED ORDER — DEXTROSE 50 % IV SOLN: 1.0000 | Freq: Once | INTRAVENOUS | Status: AC

## 2021-01-07 MED ORDER — NOREPINEPHRINE 4 MG/250ML-% IV SOLN
0.0000 ug/min | INTRAVENOUS | Status: DC
  Administered 2021-01-07: 38 ug/min via INTRAVENOUS
  Administered 2021-01-07: 56 ug/min via INTRAVENOUS
  Administered 2021-01-07: 10 ug/min via INTRAVENOUS
  Filled 2021-01-07: qty 250
  Filled 2021-01-07: qty 500
  Filled 2021-01-07 (×2): qty 250

## 2021-01-07 MED ORDER — EPINEPHRINE HCL 5 MG/250ML IV SOLN IN NS
INTRAVENOUS | Status: AC
  Filled 2021-01-07: qty 250

## 2021-01-08 LAB — COMPREHENSIVE METABOLIC PANEL
ALT: 103 U/L — ABNORMAL HIGH (ref 0–44)
AST: 461 U/L — ABNORMAL HIGH (ref 15–41)
Albumin: 1.6 g/dL — ABNORMAL LOW (ref 3.5–5.0)
Alkaline Phosphatase: 104 U/L (ref 38–126)
Anion gap: 26 — ABNORMAL HIGH (ref 5–15)
BUN: 18 mg/dL (ref 6–20)
CO2: 12 mmol/L — ABNORMAL LOW (ref 22–32)
Calcium: 7.3 mg/dL — ABNORMAL LOW (ref 8.9–10.3)
Chloride: 114 mmol/L — ABNORMAL HIGH (ref 98–111)
Creatinine, Ser: 1.34 mg/dL — ABNORMAL HIGH (ref 0.44–1.00)
GFR, Estimated: 55 mL/min — ABNORMAL LOW (ref >60)
Glucose, Bld: 282 mg/dL — ABNORMAL HIGH (ref 70–99)
Potassium: 6.2 mmol/L — ABNORMAL HIGH (ref 3.5–5.1)
Sodium: 152 mmol/L — ABNORMAL HIGH (ref 135–145)
Total Bilirubin: 2.5 mg/dL — ABNORMAL HIGH (ref 0.3–1.2)
Total Protein: 5.6 g/dL — ABNORMAL LOW (ref 6.5–8.1)

## 2021-01-09 LAB — CULTURE, RESPIRATORY W GRAM STAIN: Culture: NORMAL

## 2021-01-09 LAB — BLOOD CULTURE ID PANEL (REFLEXED) - BCID2

## 2021-01-09 LAB — AEROBIC/ANAEROBIC CULTURE W GRAM STAIN (SURGICAL/DEEP WOUND)

## 2021-01-09 MED FILL — Medication: Qty: 1 | Status: AC

## 2021-01-11 LAB — CULTURE, BLOOD (ROUTINE X 2): Special Requests: ADEQUATE

## 2021-01-12 LAB — CULTURE, BLOOD (ROUTINE X 2)
Culture: NO GROWTH
Special Requests: ADEQUATE

## 2021-01-30 NOTE — Procedures (Signed)
Extubation Procedure Note  Patient Details:   Name: Kathryn Medina DOB: [birth year] MRN: [id number]   Airway Documentation:    Vent end date: Jan 30, 2021 Vent end time: 0754   Evaluation  Pt extubated per comfort care. Guss Bunde 01/30/21, 7:55 AM

## 2021-01-30 NOTE — Progress Notes (Signed)
OT Cancellation Note  Patient Details Name: Kathryn Medina MRN: [id number] DOB: [birth year]   Cancelled Treatment:    Reason Eval/Treat Not Completed: Medical issues which prohibited therapy. Pt intubated overnight.  Ignacia Palma, OTR/L Acute Rehab Services Pager [phone] Office [phone]    Kathryn Medina 01/24/2021, 7:41 AM

## 2021-01-30 NOTE — Procedures (Signed)
Arterial Catheter Insertion Procedure Note  Kathryn Medina  [id number]  1990-07-02  Date:01/22/2021  Time:6:55 AM    Provider Performing: Kathryn Medina    Procedure: Insertion of Arterial Line (76734) with US guidance ([id number])   Indication(s) Blood pressure monitoring and/or need for frequent ABGs  Consent Unable to obtain consent due to emergent nature of procedure.  Anesthesia None   Time Out Verified patient identification, verified procedure, site/side was marked, verified correct patient position, special equipment/implants available, medications/allergies/relevant history reviewed, required imaging and test results available.   Sterile Technique Maximal sterile technique including full sterile barrier drape, hand hygiene, sterile gown, sterile gloves, mask, hair covering, sterile ultrasound probe cover (if used).   Procedure Description Area of catheter insertion was cleaned with chlorhexidine and draped in sterile fashion. With real-time ultrasound guidance an arterial catheter was placed into the right radial artery.  Appropriate arterial tracings confirmed on monitor.     Complications/Tolerance None; patient tolerated the procedure well.   EBL Minimal   Specimen(s) None  Kathryn Medina [facility] 12/31/2020, 6:55 AM  Please see [email or web address] for pager details.  From 7A-7P if no response, please call [phone]. After hours, please call ELink [phone].

## 2021-01-30 NOTE — Progress Notes (Signed)
CCM ground team came to patient's bedside to evaluate patient. Tube feeds turned off. Patient had fever of 101.7. Tylenol given and new blood cultures ordered.   Chest x-ray clear, and ABG indicative of patient's hyperventilation with CO2 of 18.8.   RN will continue to monitor.    Harriett Sine, RN

## 2021-01-30 NOTE — Accreditation Note (Signed)
Restraints not reported to CMS Pursuant to regulation 482.13 (G) (3) use of soft wrist restraints was logged. 

## 2021-01-30 NOTE — Progress Notes (Signed)
0700: RN took report from Harriett Sine, Charity fundraiser. Patient unstable on Zoll monitor, CCM MD at bedside with multiple stat orders being placed.   0745: Dr Merrily Pew goals of care discussion with family. Patient made DNR, placed on fentanyl drip for comfort. All other drips stopped.   9357: Patient extubated to room air with RT.   [phone]: This RN pronounced with Prudy Feeler, RN time of death. Family at bedside, unsure of funeral home request at this time. Patient placement card and number given. No belongings present at bedside at this time.  0930: Post mortem checklist completed.

## 2021-01-30 NOTE — Progress Notes (Addendum)
eLink Physician-Brief Progress Note Patient Name: Kathryn Medina DOB: 15-Jan-1991 MRN: [id number]   Date of Service  01/22/2021  HPI/Events of Note  ABG on 100%/PRVC 25/TV 480/P 5 = 7.07/31.0/419/8.9. Blood glucose = 66.  eICU Interventions  Plan: Increase PRVC rate to 32. NaHCO3 100 meq IV now. D5 NaHCO3 IV infusion to run at 125 mL/hour. Repeat ABG at 7 AM.     Intervention Category Major Interventions: Acid-Base disturbance - evaluation and management;Respiratory failure - evaluation and management  Garret Teale [place] 01/17/2021, 3:22 AM

## 2021-01-30 NOTE — Progress Notes (Addendum)
Called to patient room due to increasing vasopressor requirements. Maxed out on Levo. Vaso added. Putting in arterial line for bp measurements. Off sedation. Patient not following commands. Perrl. Not withdrawing to pain. Patient was given bicarb for acidotic ph on ABG. Will recheck ABG. Lactic acid >9.Trend  lactate. Check cmp, cbc, mag. Will order CT head, chest, abdomen/pelvis. Start stress dose steroids.  [name] [facility] 12/30/2020, 6:58 AM  Please see [email or web address] for pager details.  From 7A-7P if no response, please call [phone]. After hours, please call ELink [phone].

## 2021-01-30 NOTE — Progress Notes (Signed)
Inpatient Rehab Admissions Coordinator Note:   Per PT patient was screened for CIR candidacy by Jenia Klepper Luvenia Starch, CCC-SLP. Note pt is now comfort care. CIR is no longer required.        Wolfgang Phoenix, MS, CCC-SLP Admissions Coordinator [phone] 24-Jan-2021 9:38 AM

## 2021-01-30 NOTE — Progress Notes (Signed)
eLink Physician-Brief Progress Note Patient Name: Kathryn Medina DOB: 08-21-1990 MRN: [id number]   Date of Service  01/23/2021  HPI/Events of Note  Hypotension - SBP = 70's to 90's with MAP = 60-70 on Norepinephrine IV infusion at 40 mcg/min.  eICU Interventions  Plan: NaHCO3 100 meq IV X 1 now. Increase ceiling on Norepinephrine IV infusion to 60 mcg/min.     Intervention Category Major Interventions: Hypotension - evaluation and management  Alee Gressman [place] 01/28/2021, 5:20 AM

## 2021-01-30 NOTE — Progress Notes (Signed)
E-link notified by this RN due to patients tachypnea and tachycardia. Patient's RR in the 50s and HR in the 140s. Patient's O2 sats above 98. Dr. Arsenio Loader placed orders for STAT chest x-ray and ABG, as well as paged the ground team to come evaluate the patient. RN will continue to monitor.   Harriett Sine, RN

## 2021-01-30 NOTE — Progress Notes (Addendum)
E-link called by this RN due to patient's worsening tachypnea and tachycardia. Patient's RR now reaching 70x per minute, and HR in 150s. Patient maintaining O2 sats above 98 and temperature came down to 100.1. Dr. Arsenio Loader paged CCM ground team to come to the patient's bedside to evaluate again. RN will continue to monitor.  Harriett Sine, RN

## 2021-01-30 NOTE — Progress Notes (Signed)
CCM ground team came to evaluate the patient at the bedside. Patient was tachycardic, febrile, and diaphoretic, with increased respiratory effort and distress. Decision was made to intubate patient.   Prior to intubation of fentanyl and 100mg  of propofol given, as well as levophed initiated at for expected drop in BP with sedation medications. No paralytics administered. Intubation successful.   Patient is now on fentanyl drip at 161mcg/hr, propofol drip at 51mcg/kg/min, levophed drip at 25mcg/min, and nafcilin and 3% fluids are continued. Tube feeds jevity 1.5 resumed at 47ml/hr. Patient calm/resting, although remains tachycardic. RN will continue to monitor.   51m, RN

## 2021-01-30 NOTE — Procedures (Signed)
Patient's RR in 60s and in resp distress. O2 sats stable on RA. Patient not following commands and no gag/reflex. Unable to protect airway. Will intubate for airway protection  Intubation Procedure Note  Kathryn Medina  [id number]  28-Feb-1991  Date:Jan 27, 2021  Time:1:40 AM   Provider Performing:[name]    Procedure: Intubation (31500)  Indication(s) Respiratory Failure  Consent Unable to obtain consent due to emergent nature of procedure.   Anesthesia Fentanyl and Propofol   Time Out Verified patient identification, verified procedure, site/side was marked, verified correct patient position, special equipment/implants available, medications/allergies/relevant history reviewed, required imaging and test results available.   Sterile Technique Usual hand hygeine, masks, and gloves were used   Procedure Description Patient positioned in bed supine.  Sedation given as noted above.  Patient was intubated with endotracheal tube using Glidescope.  View was Grade 1 full glottis .  Number of attempts was 1.  Colorimetric CO2 detector was consistent with tracheal placement.   Complications/Tolerance None; patient tolerated the procedure well. Chest X-ray is ordered to verify placement.   EBL Minimal   Specimen(s) None  Kathryn Medina [facility] 01/27/21, 1:42 AM  Please see [email or web address] for pager details.  From 7A-7P if no response, please call [phone]. After hours, please call ELink [phone].

## 2021-01-30 NOTE — Progress Notes (Addendum)
S:  30 yo F with hx of polysubstance abuse, MSSA endocarditis, hep C admitted 16-Jan-2021 with MSSA bacteremia w/ tricuspid valve endocariditis. Code stroke called which CT showed Large L MCA stroke. Started on Eliquis not a candidate for thrombectomy. Neuro following and on hypertonic saline.  On 01/29/2021: PCCM called for tachycardia and increased RR. Found to be febrile with temp 102 F. Tylenol given and Bcx2 drawn. Patient on RA with sats 98%.  A few hours later patient fever now 100 F. RR now in 60s. Tachycardia with rate 140s. Patient obtunding and increased resp muscle usage. Sats remain high 90s on room air. Patient without cough/gag reflex and not following commands.    O: Today's Vitals   01/06/21 2235 01/06/21 2300 01/01/2021 0000 01/17/2021 0100  BP:  (!) 122/98 (!) 115/98   Pulse:  (!) 300 (!) 146   Resp:  (!) 54 (!) 61   Temp: (!) 101.7 F (38.7 C)  100.1 F (37.8 C)   TempSrc: Axillary  Axillary   SpO2:  96% 97%   Weight:      Height:    5\' 7"  (1.702 m)  PainSc:       Body mass index is 21.58 kg/m.  General: critically ill appearing in respiratory distress HEENT: MM pink/moist; cor trak in place Neuro: patient not following commands; no cough/gag CV: s1s2, sinus tachy with rate 140s, no m/r/g PULM:  dim rhonchi BS bilaterally; on room air with sats high 90s; RR 60s; using accessory muscles GI: soft, bsx4 active  Extremities: warm/dry, no edema  Skin: no rashes or lesions    AP:   Acute respiratory distress: unable to protect airway and RR in 60s P: -will intubate to protect airway -Place on mech vent PRVC 6-8 cc/kg -ABG in one hour; adjust settings accordingly -VAP prevention in place -wean fio2 for sats >92% -sedation with propofol and fentanyl for RASS 0 to -1 -levo started to help maintain BP throughout intubation procedure for anticipation of hypotension. Wean levo for MAP >65   , PA-C [facility] 01/26/2021, 2:17  AM  Please see [email or web address] for pager details.  From 7A-7P if no response, please call [phone]. After hours, please call ELink [phone].

## 2021-01-30 NOTE — Death Summary Note (Signed)
DEATH SUMMARY   Patient Details  Name: Kathryn Medina MRN: [id number] DOB: Apr 10, 1990  Admission/Discharge Information   Admit Date:  2021/01/22  Date of Death: Date of Death: 2021/01/31  Time of Death: Time of Death: 0832  Length of Stay: 02-Aug-2022  Referring Physician: Pcp, No   Reason(s) for Hospitalization  Acute medical left MCA stroke Acute hypoxic respiratory failure Septic shock Tricuspid valve infective endocarditis with MSSA MSSA bacteremia Probable bowel ischemia from septic emboli Lactic acidosis Probable right to left shunt Septic pulmonary emboli with pneumonia Severe malnutrition Anemia of critical illness Hyponatremia  Diagnoses  Preliminary cause of death:   Septic shock from septic emboli to gut leading to get ischemia  Secondary Diagnoses (including complications and co-morbidities):  Active Problems:   Acute infective endocarditis   MSSA bacteremia   Severe opioid use disorder (HCC)   Bacteremia   Pressure injury of skin   Opioid use with withdrawal (HCC)   Cerebral thrombosis with cerebral infarction   Malnutrition of moderate degree   Acute respiratory failure (HCC)   Arterial line in place   Brief Hospital Course (including significant findings, care, treatment, and services provided and events leading to death)  Kathryn Medina is a 30 y.o. year old female who 30 year old with polysubstance abuse, MSSA endocarditis, hepatitis C admitted with MSSA bacteremia in the setting of known tricuspid valve endocarditis.  She underwent angio Vac debridement of tricuspid valve on 10/6.  Code stroke called this evening with CT findings suggestive of large left MCA stroke.  She is not a candidate for thrombectomy as she recently received Eliquis.  Neurology consulted and has recommended 3% saline.  Patient was started on 3% saline. On 31-Jan-2021 patient became agitated, restless, tachypneic and hypotensive, ABG was drawn which showed pH 7.07 with a lactic acid more  than 9, she was given bicarbonate pushes and started on bicarbonate infusion, she was intubated and placed on mechanical ventilation.  Because of her hypotension she was started on IV vasopressors, she was continued on IV antibiotics, patient went into refractory shock despite multiple vasopressors probably bowel ischemia caused by septic emboli.  Patient family was contacted and updated about worsening situation of the patient.  All family members were at bedside, they decided to keep her comfortable and made her DNR.  She was started on comfort care meds and she was declared dead on January 31, 2021 at 8:32 AM, she was comfortable and family was at bedside    Pertinent Labs and Studies  Significant Diagnostic Studies CT HEAD WO CONTRAST ( )  Result Date: 01/06/2021 CLINICAL DATA:  30 year old female with left ICA terminus occlusion. Large left MCA and PCA territory infarcts. EXAM: CT HEAD WITHOUT CONTRAST TECHNIQUE: Contiguous axial images were obtained from the base of the skull through the vertex without intravenous contrast. COMPARISON:  Head CT 01/06/2021. FINDINGS: Brain: Confluent cytotoxic edema now throughout the posterior left hemisphere including confluent left PCA territory involvement, confluent posterior left MCA and patchy middle left MCA territory involvement at the insula and operculum. There is patchy left deep gray nuclei involvement. No hemorrhagic transformation identified, but mass effect now on the left lateral ventricle and 3-4 mm of rightward midline shift. Basilar cisterns remain patent. Artifact suspected in the brainstem (series 2, images 10 and 12) with other posterior fossa gray-white matter differentiation preserved. Right hemisphere gray-white matter differentiation preserved. Vascular: Mild Calcified atherosclerosis at the skull base. Skull: No acute osseous abnormality identified. Sinuses/Orbits: Visualized paranasal sinuses and mastoids are stable and well  aerated. Other: Left  nasoenteric tube now in place. Small volume fluid in the nasopharynx. Visualized orbits and scalp soft tissues are within normal limits. IMPRESSION: 1. Large evolving Left PCA and middle to posterior Left MCA infarctions. Confluent cytotoxic edema with interval increased intracranial mass effect, now 3-4 mm of rightward midline shift. But no hemorrhagic transformation. 2. Artifact suspected in the brainstem on this exam. No definite additional cytotoxic edema. 3. Left nasoenteric tube now in place. Electronically Signed   By: Odessa Fleming M.D.   On: 01/06/2021 05:26   CT HEAD WO CONTRAST ( )  Result Date: 12/12/2020 CLINICAL DATA:  Encephalopathy EXAM: CT HEAD WITHOUT CONTRAST TECHNIQUE: Contiguous axial images were obtained from the base of the skull through the vertex without intravenous contrast. COMPARISON:  None. FINDINGS: Brain: There is no mass, hemorrhage or extra-axial collection. The size and configuration of the ventricles and extra-axial CSF spaces are normal. The brain parenchyma is normal, without acute or chronic infarction. Vascular: No abnormal hyperdensity of the major intracranial arteries or dural venous sinuses. No intracranial atherosclerosis. Skull: The visualized skull base, calvarium and extracranial soft tissues are normal. Sinuses/Orbits: Right mastoid and middle ear fluid, unchanged. The orbits are normal. IMPRESSION: Normal brain. Electronically Signed   By: Deatra Robinson M.D.   On: 12/11/2020 03:48   Portable Chest x-ray  Result Date: 01/08/21 CLINICAL DATA:  Intubation EXAM: PORTABLE CHEST 1 VIEW COMPARISON:  01/06/2021 FINDINGS: Endotracheal tube tip is below the level of the clavicles and approximately 4 cm above the inferior margin of the carina. Multifocal bilateral opacities are unchanged. Small right pleural effusion. IMPRESSION: Endotracheal tube tip approximately 4 cm above the inferior margin of the carina. Unchanged bilateral airspace opacities. Electronically Signed    By: Deatra Robinson M.D.   On: Jan 08, 2021 02:20   DG CHEST PORT 1 VIEW  Result Date: 01/06/2021 CLINICAL DATA:  Endocarditis, MSSA bacteremia EXAM: PORTABLE CHEST 1 VIEW COMPARISON:  01/05/2021 FINDINGS: Nasoenteric feeding tube extends into the upper abdomen beyond the margin of the examination. Left internal jugular central venous catheter tip is obscured by overlying external wires. Pulmonary insufflation is stable. Multifocal consolidation within the peripheral left mid lung zone and right lung base is stable. Small right pleural effusion is unchanged. No pneumothorax. Cardiac size is mildly enlarged. Pulmonary vascularity is normal. No acute bone abnormality. IMPRESSION: Stable multifocal pulmonary consolidation, likely infectious or inflammatory. Stable small right parapneumonic effusion. Stable cardiomegaly. Nasoenteric feeding tube extends into the upper abdomen. Electronically Signed   By: Helyn Numbers M.D.   On: 01/06/2021 22:25   DG Chest Port 1 View  Result Date: 01/05/2021 CLINICAL DATA:  Acute respiratory failure EXAM: PORTABLE CHEST 1 VIEW COMPARISON:  Radiograph 01/03/2021 FINDINGS: Left neck approach catheter tip is unchanged in position overlying the distal superior vena cava. Unchanged cardiomediastinal silhouette. Multifocal airspace disease, unchanged in left mid lung and increased in the right lower lung. Small right pleural effusion. No visible pneumothorax. IMPRESSION: Persistent multifocal airspace disease, unchanged in the left mid lung and increased in the right lower lung. Electronically Signed   By: Caprice Renshaw M.D.   On: 01/05/2021 09:33   DG CHEST PORT 1 VIEW  Result Date: 01/14/2021 CLINICAL DATA:  Dyspnea code stroke EXAM: PORTABLE CHEST 1 VIEW COMPARISON:  12/25/2020, CT 11/21/2020 FINDINGS: Low lung volumes. Probable small right effusion. Right peripheral basilar and left midlung opacity with suspected cavitation likely representing septic emboli. Left IJ central venous  catheter tip over the SVC. Borderline cardiomegaly.  No visible pneumothorax. IMPRESSION: 1. Left IJ central venous catheter tip over the SVC. 2. Bilateral nodular opacities some of which appear to contain cavitation and are suspect for septic emboli. Small right pleural effusion Electronically Signed   By: Jasmine Pang M.D.   On: 01/22/2021 21:51   DG Chest Port 1 View  Result Date: 01-Jan-2021 CLINICAL DATA:  Bacteremia EXAM: PORTABLE CHEST 1 VIEW COMPARISON:  11/30/2020 FINDINGS: Improved aeration of the right lower lobe. New opacities in the left mid lung. No pleural effusion or pneumothorax. Normal cardiomediastinal contours. IMPRESSION: Improved aeration of the right lower lobe and new opacities in the left mid lung. This may indicate atelectasis or developing infection. Electronically Signed   By: Deatra Robinson M.D.   On: January 01, 2021 00:54   DG Abd Portable 1V  Result Date: 01/05/2021 CLINICAL DATA:  Nasogastric tube placement. EXAM: PORTABLE ABDOMEN - 1 VIEW COMPARISON:  Chest x-ray 01/05/2021 FINDINGS: Interval placement of an enteric tube overlying the expected region of the gastric antrum/pylorus. Stable left internal jugular central venous catheter with tip overlying the expected region of the superior cavoatrial junction. The bowel gas pattern is normal. No radio-opaque calculi or other significant radiographic abnormality are seen. Bilateral lower lung zone and mid lung zone airspace opacities are again noted and better evaluated on chest x-ray 01/05/2021. IMPRESSION: Interval placement of an enteric tube overlying the expected region of the gastric antrum/pylorus. Electronically Signed   By: Tish Frederickson M.D.   On: 01/05/2021 15:11   DG C-Arm 1-60 Min-No Report  Result Date: 01/23/2021 Fluoroscopy was utilized by the requesting physician.  No radiographic interpretation.   VAS [place] TRANSCRANIAL DOPPLER W BUBBLES  Result Date: 01/05/2021  Transcranial Doppler with Bubble Patient Name:   [facility]  Date of Exam:   01/05/2021 Medical Rec #: [id number]         Accession #:    [id number] Date of Birth: May 02, 1990          Patient Gender: F Patient Age:   30 years Exam Location:  [facility] Procedure:      VAS [place] TRANSCRANIAL DOPPLER W BUBBLES Referring Phys: Shon Hale --------------------------------------------------------------------------------  Indications: Stroke. History: No prior studies. Limitations: Involuntary patient movement. Comparison Study: 01/08/2021 Echo intraoperative TEE                    11-24-2020 Echo TEE Performing Technologist: Jean Rosenthal RDMS, RVT  Examination Guidelines: A complete evaluation includes B-mode imaging, spectral Doppler, color Doppler, and power Doppler as needed of all accessible portions of each vessel. Bilateral testing is considered an integral part of a complete examination. Limited examinations for reoccurring indications may be performed as noted.  Summary:  A vascular evaluation was performed. The right middle cerebral artery was studied. An IV was inserted into the patient's left forearm. Verbal informed consent was obtained.  A full curtain of high intensity transient signals (HITS) was observed both at rest and with valsalva maneuver, indicating a Spencer Grade 5 patent foramen ovale (PFO). *See table(s) above for TCD measurements and observations.    Preliminary    ECHOCARDIOGRAM LIMITED BUBBLE STUDY  Result Date: 01/06/2021    ECHOCARDIOGRAM LIMITED REPORT   Patient Name:   [facility] Date of Exam: 01/06/2021 Medical Rec #:  [id number]        Height:       67.0 in Accession #:    [id number]       Weight:  137.8 lb Date of Birth:  [birth year]         BSA:          1.726 m Patient Age:    30 years         BP:           139/107 mmHg Patient Gender: F                HR:           80 bpm. Exam Location:  Inpatient Procedure: Limited Echo, Limited Color Doppler, Cardiac Doppler and Saline            Contrast Bubble  Study Indications:    stroke after right heart angiovac procedure.  History:        Patient has prior history of Echocardiogram examinations.                 Endocarditis, Signs/Symptoms:Bacteremia; Risk Factors:substance                 abuse.  Sonographer:    Delcie Roch RDCS Referring Phys: [phone] MICHAEL COOPER IMPRESSIONS  1. Left ventricular ejection fraction, by estimation, is 40 to 45%. The left ventricle has mildly decreased function. The left ventricle demonstrates regional wall motion abnormalities. Septal hypokinesis.  2. Right ventricular systolic function is normal. The right ventricular size is mildly enlarged. There is mildly elevated pulmonary artery systolic pressure.  3. A small pericardial effusion is present.  4. The mitral valve is normal in structure. Moderate mitral valve regurgitation.  5. The aortic valve was not well visualized. Aortic valve regurgitation is not visualized.  6. The tricuspid valve is abnormal. Vegetation on tricuspid valve measures 2.4cm x 1.0 cm. Tricuspid valve regurgitation is severe.  7. Agitated saline contrast bubble study was positive with shunting observed within 3-6 cardiac cycles suggestive of interatrial shunt. Significant shunting seen, suggesting large PFO FINDINGS  Left Ventricle: Left ventricular ejection fraction, by estimation, is 40 to 45%. The left ventricle has mildly decreased function. The left ventricle demonstrates regional wall motion abnormalities. Right Ventricle: The right ventricular size is mildly enlarged. Right vetricular wall thickness was not well visualized. Right ventricular systolic function is normal. There is mildly elevated pulmonary artery systolic pressure. The tricuspid regurgitant  velocity is 2.54 m/s, and with an assumed right atrial pressure of 15 mmHg, the estimated right ventricular systolic pressure is 40.8 mmHg. Pericardium: A small pericardial effusion is present. Mitral Valve: The mitral valve is normal in structure.  Moderate mitral valve regurgitation. Tricuspid Valve: The tricuspid valve is abnormal. Tricuspid valve regurgitation is severe. Aortic Valve: The aortic valve was not well visualized. Aortic valve regurgitation is not visualized. IAS/Shunts: Agitated saline contrast was given intravenously to evaluate for intracardiac shunting. Agitated saline contrast bubble study was positive with shunting observed within 3-6 cardiac cycles suggestive of interatrial shunt. IVC IVC diam: 2.40 cm TRICUSPID VALVE TR Peak grad:   25.8 mmHg TR Vmax:        254.00 cm/s Epifanio Lesches MD Electronically signed by Epifanio Lesches MD Signature Date/Time: 01/06/2021/5:27:56 PM    Final    CT HEAD CODE STROKE WO CONTRAST`  Result Date: 01/02/2021 CLINICAL DATA:  Code stroke. Slurred speech, decreased movement in the right upper extremity, altered mental status EXAM: CT HEAD WITHOUT CONTRAST TECHNIQUE: Contiguous axial images were obtained from the base of the skull through the vertex without intravenous contrast. COMPARISON:  CT head January 16, 2021 FINDINGS: Brain: There is extensive hypodensity in the  left MCA distribution involving nearly the entire temporal lobe and portions of the parietal lobe. There is no evidence of acute hemorrhage. There is sulcal effacement but no no significant mass effect associated with the infarct at this time. The ventricles are normal in size. There is no midline shift at this time. Vascular: There is a dense MCA sign on the left. Skull: Normal. Negative for fracture or focal lesion. Sinuses/Orbits: No acute finding. Other: There is a right mastoid effusion. ASPECTS [facility] Stroke Program Early CT Score) - Ganglionic level infarction (caudate, lentiform nuclei, internal capsule, insula, M1-M3 cortex): 3 - Supraganglionic infarction (M4-M6 cortex): 2 Total score (0-10 with 10 being normal): 5 IMPRESSION: 1. Evolving subacute infarct in the left MCA distribution involving essentially the entire  temporal lobe and portion of the parietal lobe. 2. No evidence of hemorrhagic transformation or significant mass effect or midline shift at this time. 3. ASPECTS is 5 4. Probable hyperdense MCA on the left. These results were called by telephone at the time of interpretation on 01/03/2021 at 9:01 pm to provider Dr Otelia Limes , who verbally acknowledged these results. Electronically Signed   By: Lesia Hausen M.D.   On: 01/12/2021 21:03   ECHOCARDIOGRAM LIMITED  Result Date: 12/31/2020    ECHOCARDIOGRAM LIMITED REPORT   Patient Name:   [facility] Date of Exam: 12/31/2020 Medical Rec #:  [id number]        Height:       67.0 in Accession #:    [id number]       Weight:       139.1 lb Date of Birth:  1990/06/22         BSA:          1.733 m Patient Age:    30 years         BP:           98/69 mmHg Patient Gender: F                HR:           112 bpm. Exam Location:  Inpatient Procedure: Limited Color Doppler and Limited Echo Indications:    Bacteremia  History:        Patient has prior history of Echocardiogram examinations, most                 recent 11/24/2020. Endocarditis; Arrythmias:Tachycardia.                 Previously seen TV vegetation. H/O septic emboli. IVDU.  Sonographer:    Roosvelt Maser RDCS Referring Phys: [id number] ROBERT W COMER IMPRESSIONS  1. Left ventricular ejection fraction, by estimation, is 60 to 65%. The left ventricle has normal function. The left ventricle has no regional wall motion abnormalities.  2. Right ventricular systolic function is normal. The right ventricular size is normal. Tricuspid regurgitation signal is inadequate for assessing PA pressure.  3. The mitral valve is grossly normal. Mild mitral valve regurgitation. No evidence of mitral stenosis.  4. There is a very large shaggy highly mobile density on the tricuspid valve leaflet consistent with vegetation measuring 2.42 x 2.5cm in diameter. The vegetation move to and fro through the TV.Marland Kitchen The tricuspid valve is abnormal. Tricuspid  valve regurgitation is moderate.  5. The AV leaflets are thicker than what would normally be seen in a 30yo but no obvious vegetation is noted. . The aortic valve is tricuspid. Aortic valve regurgitation is mild. Mild to moderate aortic  valve sclerosis/calcification is present, without any evidence of aortic stenosis.  6. The inferior vena cava is normal in size with greater than 50% respiratory variability, suggesting right atrial pressure of 3 mmHg.  7. A small pericardial effusion is present. The pericardial effusion is circumferential. Conclusion(s)/Recommendation(s): Compared to prior echo, TV vegetation appears larger. Findings concerning for tricuspid valve vegetation, would recommend a Transesophageal Echocardiogram for clarification of TV as well as bettter visualization of AV. FINDINGS  Left Ventricle: Left ventricular ejection fraction, by estimation, is 60 to 65%. The left ventricle has normal function. The left ventricle has no regional wall motion abnormalities. The left ventricular internal cavity size was normal in size. There is  no left ventricular hypertrophy. Right Ventricle: The right ventricular size is normal. No increase in right ventricular wall thickness. Right ventricular systolic function is normal. Tricuspid regurgitation signal is inadequate for assessing PA pressure. Left Atrium: Left atrial size was normal in size. Right Atrium: Right atrial size was normal in size. Pericardium: A small pericardial effusion is present. The pericardial effusion is circumferential. Mitral Valve: The mitral valve is grossly normal. Mild mitral valve regurgitation. There is a prosthetic present in the mitral position. No evidence of mitral valve stenosis. Tricuspid Valve: There is a very large shaggy highly mobile density on the tricuspid valve leaflet consistent with vegetation measuring 2.42 x 2.5cm in diameter. The vegetation move to and fro through the TV. The tricuspid valve is abnormal. Tricuspid  valve regurgitation is moderate . No evidence of tricuspid stenosis. Aortic Valve: The AV leaflets are thicker than what would normally be seen in a 30yo but no obvious vegetation is noted. The aortic valve is tricuspid. Aortic valve regurgitation is mild. Mild to moderate aortic valve sclerosis/calcification is present, without any evidence of aortic stenosis. Pulmonic Valve: The pulmonic valve was normal in structure. Pulmonic valve regurgitation is not visualized. No evidence of pulmonic stenosis. Aorta: The aortic root is normal in size and structure. Venous: The inferior vena cava is normal in size with greater than 50% respiratory variability, suggesting right atrial pressure of 3 mmHg. IAS/Shunts: No atrial level shunt detected by color flow Doppler. Armanda Magic MD Electronically signed by Armanda Magic MD Signature Date/Time: 12/31/2020/1:00:41 PM    Final    CT ANGIO HEAD NECK W WO CM (CODE STROKE)  Result Date: 01/16/2021 CLINICAL DATA:  Initial evaluation for slurred speech, right-sided weakness. EXAM: CT ANGIOGRAPHY HEAD AND NECK TECHNIQUE: Multidetector CT imaging of the head and neck was performed using the standard protocol during bolus administration of intravenous contrast. Multiplanar CT image reconstructions and MIPs were obtained to evaluate the vascular anatomy. Carotid stenosis measurements (when applicable) are obtained utilizing NASCET criteria, using the distal internal carotid diameter as the denominator. CONTRAST:  75mL OMNIPAQUE IOHEXOL 350 MG/ML SOLN COMPARISON:  Head CT from earlier the same day. FINDINGS: CTA NECK FINDINGS Aortic arch: Visualized aortic arch normal in caliber with normal 3 vessel morphology. No stenosis about the origin the great vessels. Right carotid system: Right common and internal carotid arteries patent without stenosis, dissection or occlusion. Left carotid system: Left CCA patent from its origin to the bifurcation without stenosis. No atheromatous narrowing  about the left bifurcation. Left ICA diffusely narrowed with increasing attenuation as it courses cephalad towards the skull base, likely related downstream occlusion. Vertebral arteries: Both vertebral arteries arise from the subclavian arteries. Right vertebral artery widely patent within the neck. Left vertebral artery occluded at its origin. Distal reconstitution at the proximal V2  segment. Left vertebral artery otherwise patent distally within the neck. Skeleton: No discrete or worrisome osseous lesions. Other neck: No other acute soft tissue abnormality within the neck. Upper chest: Layering right pleural effusion partially visualized. Multifocal patchy and ground-glass opacities within the visualized lungs, concerning for multifocal infection/pneumonia. Review of the MIP images confirms the above findings CTA HEAD FINDINGS Anterior circulation: Right ICA widely patent to the terminus. On the left, attenuated flow seen within the petrous and cavernous segments, with acute occlusion at the supraclinoid left ICA (series 8, image 264). Occlusion extends into the left ICA terminus. A1 segments remain patent. Normal anterior communicating artery complex. Anterior cerebral arteries patent to their distal aspects without stenosis. Right M1 segment patent. Normal right MCA bifurcation. There is a acute right M3 occlusion versus severe stenosis (series 9, image 119). Irregular thready flow seen distally. Remainder of the right MCA branches perfused. Mid and distal left M1 segment patent beyond the occlusion. Normal left MCA bifurcation. No visible downstream MCA branch occlusion. Posterior circulation: Both vertebral arteries patent to the vertebrobasilar junction without stenosis. Both PICA origins patent. Basilar patent to its distal aspect without stenosis. Superior cerebellar arteries patent bilaterally. Right PCA primarily supplied via the basilar and is widely patent to its distal aspect. Left PCA supplied via  hypoplastic P1 segment and left posterior communicating artery, severe stenosis at the origin of the left posterior communicating artery. There is occlusion versus severe stenosis involving the left P2 segment (series 9, image 138). Irregular patchy flow seen distally within the left PCA distribution, with probable additional left P4 occlusion (series 9, image 170). Venous sinuses: Grossly patent allowing for timing the contrast bolus. Anatomic variants: None significant. Review of the MIP images confirms the above findings IMPRESSION: 1. Positive CTA for with acute occlusion at the supraclinoid left ICA occlusion, extending into the left ICA terminus. Fairly robust collateral flow seen distally within the left MCA distribution. 2. Additional acute right M3 occlusion versus severe stenosis. Irregular attenuated flow distally. 3. Occlusion versus severe stenosis involving the left P2 segment. Additional subsequent distal left P4 occlusion. 4. 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 Right vertebral artery widely patent. 5. Layering right pleural effusion with multifocal patchy and ground-glass opacities within the visualized lungs, concerning for multifocal infection/pneumonia. These results were communicated to 9:45 at 10:04 pm on 01/05/2021 by text page via the [facility] messaging system. Electronically Signed   By: Rise Mu M.D.   On: 01/06/2021 22:10    Microbiology Recent Results (from the past 240 hour(s))  Culture, blood (Routine x 2)     Status: Abnormal   Collection Time: 08-Jan-2021 12:33 AM   Specimen: BLOOD  Result Value Ref Range Status   Specimen Description BLOOD SITE NOT SPECIFIED  Final   Special Requests   Final    BOTTLES DRAWN AEROBIC AND ANAEROBIC Blood Culture adequate volume   Culture  Setup Time   Final    GRAM POSITIVE COCCI IN CLUSTERS IN BOTH AEROBIC AND ANAEROBIC BOTTLES CRITICAL VALUE NOTED.  VALUE IS CONSISTENT  WITH PREVIOUSLY REPORTED AND CALLED VALUE.    Culture (A)  Final    STAPHYLOCOCCUS AUREUS SUSCEPTIBILITIES PERFORMED ON PREVIOUS CULTURE WITHIN THE LAST 5 DAYS. Performed at [facility], [address], [place], [place] [address]    Report Status 12/31/2020 FINAL  Final  Culture, blood (Routine x 2)     Status: Abnormal   Collection Time: 01-08-21  12:33 AM   Specimen: BLOOD  Result Value Ref Range Status   Specimen Description BLOOD SITE NOT SPECIFIED  Final   Special Requests AEROBIC BOTTLE ONLY Blood Culture adequate volume  Final   Culture  Setup Time   Final    GRAM POSITIVE COCCI IN CLUSTERS AEROBIC BOTTLE ONLY CRITICAL RESULT CALLED TO, READ BACK BY AND VERIFIED WITH: PHARMD E.SINCLAIR AT 1428 ON 03-Jan-2021 BY T.SAAD. Performed at [facility], [address] [place], [place] [address]    Culture STAPHYLOCOCCUS AUREUS (A)  Final   Report Status 12/31/2020 FINAL  Final   Organism ID, Bacteria STAPHYLOCOCCUS AUREUS  Final      Susceptibility   Staphylococcus aureus - MIC*    CIPROFLOXACIN <=0.5 SENSITIVE Sensitive     ERYTHROMYCIN <=0.25 SENSITIVE Sensitive     GENTAMICIN <=0.5 SENSITIVE Sensitive     OXACILLIN 0.5 SENSITIVE Sensitive     TETRACYCLINE <=1 SENSITIVE Sensitive     VANCOMYCIN 2 SENSITIVE Sensitive     TRIMETH/SULFA <=10 SENSITIVE Sensitive     CLINDAMYCIN <=0.25 SENSITIVE Sensitive     RIFAMPIN <=0.5 SENSITIVE Sensitive     Inducible Clindamycin NEGATIVE Sensitive     * STAPHYLOCOCCUS AUREUS  Blood Culture ID Panel (Reflexed)     Status: Abnormal   Collection Time: 2021-01-03 12:33 AM  Result Value Ref Range Status   Enterococcus faecalis NOT DETECTED NOT DETECTED Final   Enterococcus Faecium NOT DETECTED NOT DETECTED Final   Listeria monocytogenes NOT DETECTED NOT DETECTED Final   Staphylococcus species DETECTED (A) NOT DETECTED Final    Comment: CRITICAL RESULT CALLED TO, READ BACK BY AND VERIFIED WITH: PHARMD E.SINCLAIR AT 1428 ON  Jan 03, 2021 BY T.SAAD.    Staphylococcus aureus (BCID) DETECTED (A) NOT DETECTED Final    Comment: CRITICAL RESULT CALLED TO, READ BACK BY AND VERIFIED WITH: PHARMD E.SINCLAIR AT 1428 ON 03-Jan-2021 BY T.SAAD.    Staphylococcus epidermidis NOT DETECTED NOT DETECTED Final   Staphylococcus lugdunensis NOT DETECTED NOT DETECTED Final   Streptococcus species NOT DETECTED NOT DETECTED Final   Streptococcus agalactiae NOT DETECTED NOT DETECTED Final   Streptococcus pneumoniae NOT DETECTED NOT DETECTED Final   Streptococcus pyogenes NOT DETECTED NOT DETECTED Final   A.calcoaceticus-baumannii NOT DETECTED NOT DETECTED Final   Bacteroides fragilis NOT DETECTED NOT DETECTED Final   Enterobacterales NOT DETECTED NOT DETECTED Final   Enterobacter cloacae complex NOT DETECTED NOT DETECTED Final   Escherichia coli NOT DETECTED NOT DETECTED Final   Klebsiella aerogenes NOT DETECTED NOT DETECTED Final   Klebsiella oxytoca NOT DETECTED NOT DETECTED Final   Klebsiella pneumoniae NOT DETECTED NOT DETECTED Final   Proteus species NOT DETECTED NOT DETECTED Final   Salmonella species NOT DETECTED NOT DETECTED Final   Serratia marcescens NOT DETECTED NOT DETECTED Final   Haemophilus influenzae NOT DETECTED NOT DETECTED Final   Neisseria meningitidis NOT DETECTED NOT DETECTED Final   Pseudomonas aeruginosa NOT DETECTED NOT DETECTED Final   Stenotrophomonas maltophilia NOT DETECTED NOT DETECTED Final   Candida albicans NOT DETECTED NOT DETECTED Final   Candida auris NOT DETECTED NOT DETECTED Final   Candida glabrata NOT DETECTED NOT DETECTED Final   Candida krusei NOT DETECTED NOT DETECTED Final   Candida parapsilosis NOT DETECTED NOT DETECTED Final   Candida tropicalis NOT DETECTED NOT DETECTED Final   Cryptococcus neoformans/gattii NOT DETECTED NOT DETECTED Final   Meth resistant mecA/C and MREJ NOT DETECTED NOT DETECTED Final    Comment: Performed at [facility]  [facility], [address] [place],  [place] [address]  Culture, blood (routine x 2)     Status: None   Collection Time: 12/30/20  7:07 AM   Specimen: BLOOD LEFT HAND  Result Value Ref Range Status   Specimen Description BLOOD LEFT HAND  Final   Special Requests   Final    BOTTLES DRAWN AEROBIC ONLY Blood Culture adequate volume   Culture   Final    NO GROWTH 5 DAYS Performed at [facility], [address], [place], [place] [address]    Report Status 2021-02-03 FINAL  Final  Culture, blood (routine x 2)     Status: None   Collection Time: 12/30/20  7:07 AM   Specimen: BLOOD RIGHT HAND  Result Value Ref Range Status   Specimen Description BLOOD RIGHT HAND  Final   Special Requests   Final    BOTTLES DRAWN AEROBIC ONLY Blood Culture adequate volume   Culture   Final    NO GROWTH 5 DAYS Performed at [facility], [address] [place], [place] [address]    Report Status Feb 03, 2021 FINAL  Final  Aerobic/Anaerobic Culture w Gram Stain (surgical/deep wound)     Status: None (Preliminary result)   Collection Time: February 03, 2021  9:04 AM   Specimen: Soft Tissue, Other  Result Value Ref Range Status   Specimen Description TISSUE  Final   Special Requests  SOFT TISSUE TRICUSPID  Final   Gram Stain   Final    FEW WBC PRESENT,BOTH PMN AND MONONUCLEAR RARE GRAM POSITIVE COCCI Performed at [facility], [address] [place], [place] [address]    Culture   Final    RARE STAPHYLOCOCCUS AUREUS SUSCEPTIBILITIES TO FOLLOW NO ANAEROBES ISOLATED; CULTURE IN PROGRESS FOR 5 DAYS    Report Status PENDING  Incomplete  MRSA Next Gen by PCR, Nasal     Status: None   Collection Time: February 03, 2021 10:07 PM   Specimen: Nasal Mucosa; Nasal Swab  Result Value Ref Range Status   MRSA by PCR Next Gen NOT DETECTED NOT DETECTED Final    Comment: (NOTE) The GeneXpert MRSA Assay (FDA approved for NASAL specimens only), is one component of a comprehensive MRSA colonization surveillance program. It is not intended to diagnose MRSA  infection nor to guide or monitor treatment for MRSA infections. Test performance is not FDA approved in patients less than 59 years old. Performed at [facility], [address] [place], [place] [address]   Culture, Respiratory w Gram Stain     Status: None (Preliminary result)   Collection Time: 01/11/2021  1:38 AM   Specimen: Tracheal Aspirate; Respiratory  Result Value Ref Range Status   Specimen Description TRACHEAL ASPIRATE  Final   Special Requests NONE  Final   Gram Stain   Final    FEW SQUAMOUS EPITHELIAL CELLS PRESENT FEW WBC SEEN FEW GRAM POSITIVE RODS Performed at [facility], [address], [place], [place] [address]    Culture PENDING  Incomplete   Report Status PENDING  Incomplete    Lab Basic Metabolic Panel: Recent Labs  Lab 01/02/21 0103 Feb 03, 2021 2028 01/05/21 0545 01/05/21 1659 01/05/21 2324 01/06/21 0532 01/06/21 2224 01/28/2021 0241 12/30/2020 0415 01/02/2021 0626 01/21/2021 0650  NA 131* 133* 132*   < > 140   < > 150* 151* 148* PENDING 149*  K 4.4 4.7 4.2  --  3.9  --  4.2 5.5*  --  PENDING 5.8*  CL 99 102 103  --  112*  --   --   --   --  PENDING  --   CO2 --  19*  --   --   --   --  PENDING  --   GLUCOSE 90 124* 104*  --  132*  --   --   --   --  282*  --   BUN --  12  --   --   --   --  18  --   CREATININE 0.64 0.40* 0.44  --  0.44  --   --   --   --  1.34*  --   CALCIUM 7.6* 8.4* 8.2*  --  8.0*  --   --   --   --  7.3*  --   MG  --   --  1.9  --  1.9  --   --   --   --  2.7*  --   PHOS  --   --  4.4  --   --   --   --   --   --   --   --    < > = values in this interval not displayed.   Liver Function Tests: Recent Labs  Lab 01/24/2021 2028 01/24/2021 0626  AST 33 461*  ALT 15 103*  ALKPHOS 111 104  BILITOT 0.5 2.5*  PROT 6.6 5.6*  ALBUMIN 1.9* 1.6*   No results for input(s): LIPASE, AMYLASE in the last 168 hours. No results for input(s): AMMONIA in the last 168 hours. CBC: Recent Labs  Lab 01/03/21 0129  01/14/2021 0134 01/16/2021 2028 01/05/21 0545 01/06/21 2224 01/15/2021 0241 01/25/2021 0626 01/26/2021 0650  WBC 15.2* 15.0* 21.3* 17.8*  --   --  43.8*  --   HGB 7.0* 7.2* 7.5* 7.3* 7.8* 11.2* 7.4* 8.5*  HCT 22.4* 23.9* 25.1* 23.6* 23.0* 33.0* 26.7* 25.0*  MCV 79.2* 80.5 79.7* 80.0  --   --  90.2  --   PLT 183 190 208 207  --   --  301  --    Cardiac Enzymes: No results for input(s): CKTOTAL, CKMB, CKMBINDEX, TROPONINI in the last 168 hours. Sepsis Labs: Recent Labs  Lab 01/21/2021 0134 01/12/2021 2028 01/05/21 0545 01/02/2021 0415 01/24/2021 0626 01/06/2021 0628  WBC 15.0* 21.3* 17.8*  --  43.8*  --   LATICACIDVEN  --   --   --  >9.0*  --  >9*    Procedures/Operations     Tayja Manzer 01/23/2021, 9:23 AM

## 2021-01-30 NOTE — Progress Notes (Signed)
SLP Cancellation Note  Patient Details Name: Kathryn Medina MRN: [id number] DOB: Jan 25, 1991   Cancelled treatment:       Reason Eval/Treat Not Completed: Other (comment) (Patient passed away this AM.)   Angela Nevin, MA, CCC-SLP Speech Therapy

## 2021-01-30 NOTE — Plan of Care (Signed)
GOALS OF CARE DISCUSSION   The Clinical status was relayed to patient's family bedside  in detail.   Updated and notified of patients medical condition.     Patient remains unresponsive and will not open eyes to command.   Explained to family course of therapy and the modalities  Signs of brain damage Evidence of kidney failure Recommend follow up NEUROLOGY RECS   Patient with Progressive multiorgan failure with a very high probablity of a very minimal chance of meaningful recovery despite all aggressive and optimal medical therapy.  Per patient's family request patient was made DNR and comfort care only. DNR and comfort care orders were placed     Family are satisfied with Plan of action and management. All questions answered   Critical care time spent 30 minutes    Cheri Fowler MD [facility] See Amion for pager If no response to pager, please call [phone] until 7pm After 7pm, Please call E-link [phone]

## 2021-01-30 DEATH — deceased

## 2021-02-02 LAB — FUNGUS CULTURE WITH STAIN

## 2021-02-02 LAB — FUNGAL ORGANISM REFLEX

## 2021-02-02 LAB — FUNGUS CULTURE RESULT

## 2022-12-11 IMAGING — CT CT ANGIO CHEST
2 of 6 series · 17 of 36 positions shown · IV contrast (omnipaque)
Comparison: None.

CLINICAL DATA: Concern for pulmonary embolism.

EXAM:
CT ANGIOGRAPHY CHEST WITH CONTRAST
TECHNIQUE: Multidetector CT imaging of the chest was performed using the
standard protocol during bolus administration of intravenous
contrast. Multiplanar CT image reconstructions and MIPs were
obtained to evaluate the vascular anatomy.
CONTRAST:  56 mL OMNIPAQUE IOHEXOL 350 MG/ML SOLN

[Series 7: pe thins · axial · 0.73mm/px · z∈[+942,+1190]mm · 16 of 395 slices shown]
[im 20/395  lung]
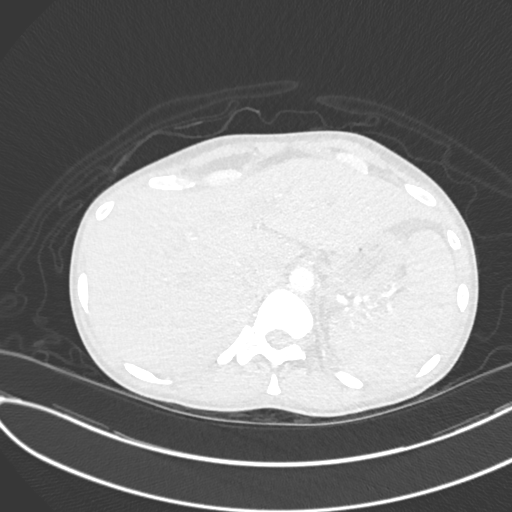
[im 40/395  mediastinal]
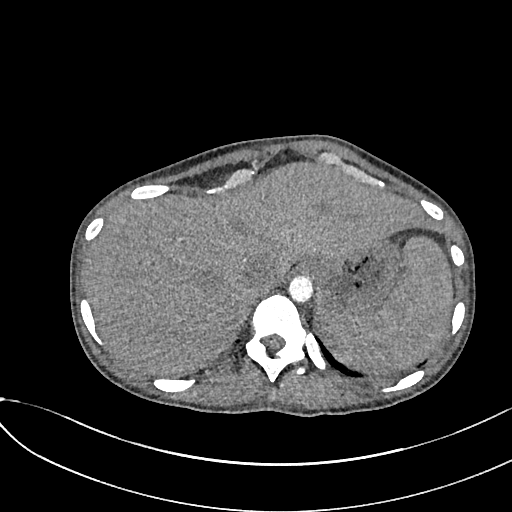
[im 60/395  lung]
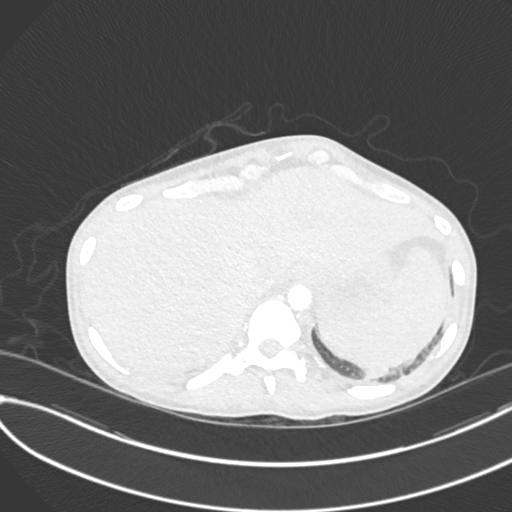
[im 99/395  mediastinal]
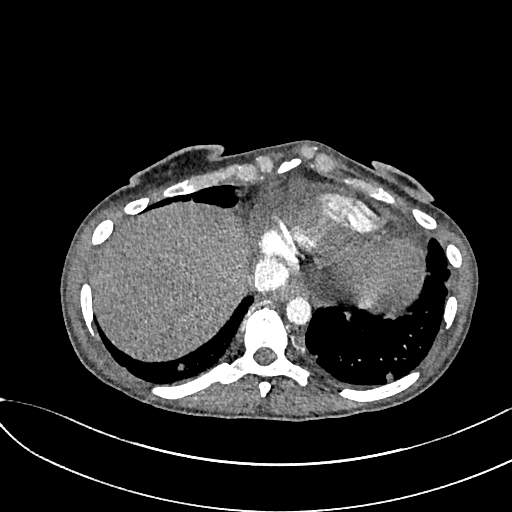
[im 119/395  lung]
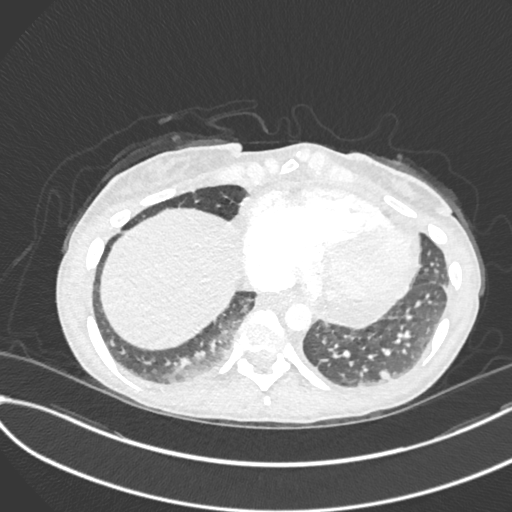
[im 138/395  mediastinal]
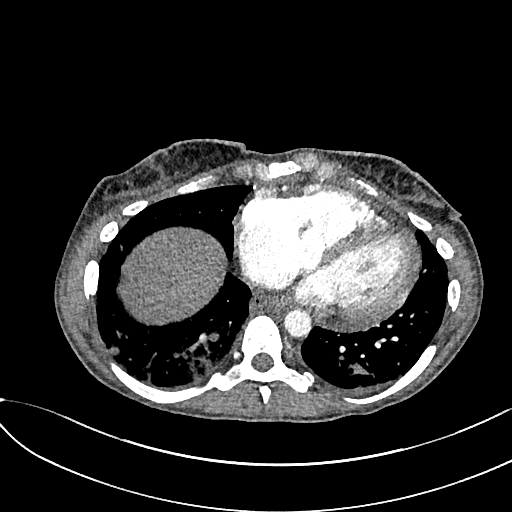
[im 158/395  lung]
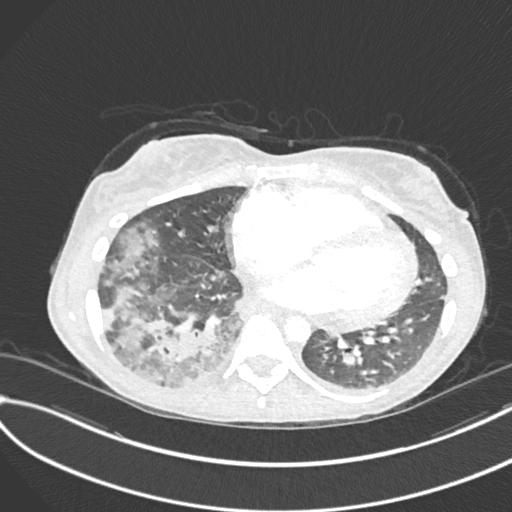
[im 178/395  mediastinal]
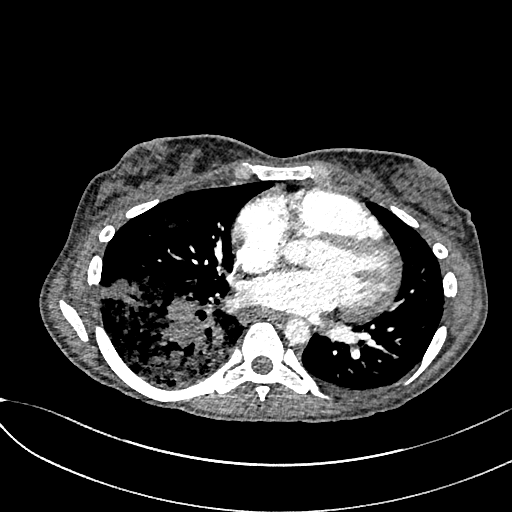
[im 217/395  lung]
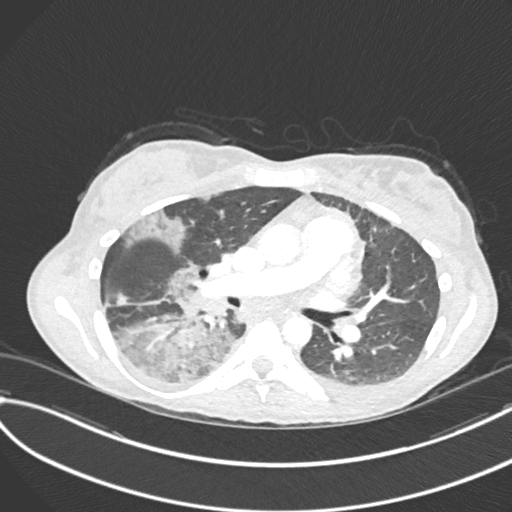
[im 237/395  mediastinal]
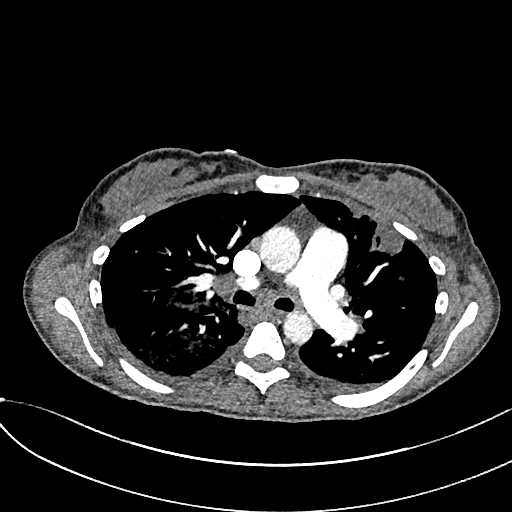
[im 257/395  lung]
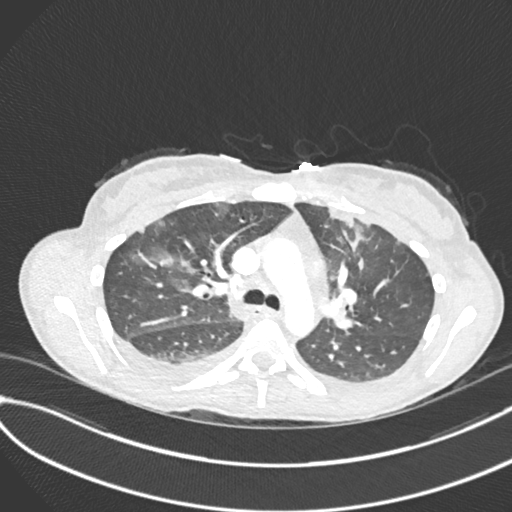
[im 276/395  mediastinal]
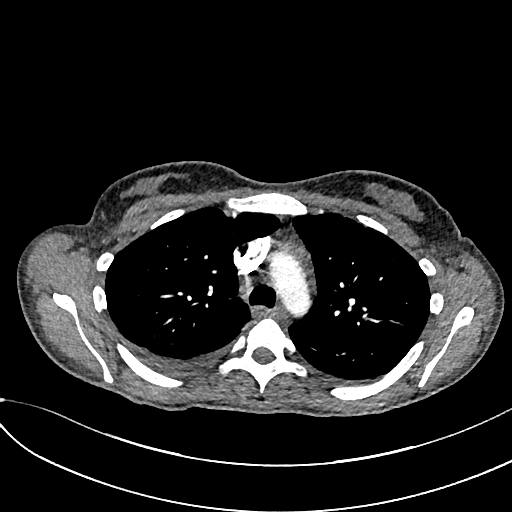
[im 296/395  lung]
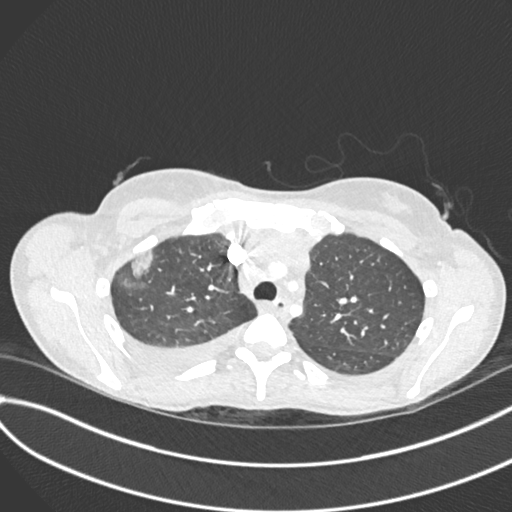
[im 335/395  mediastinal]
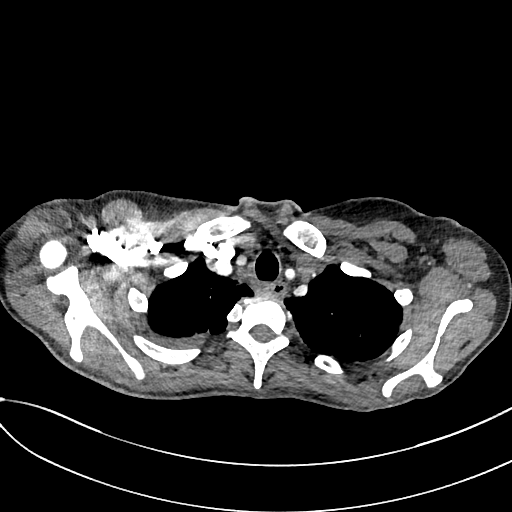
[im 355/395  lung]
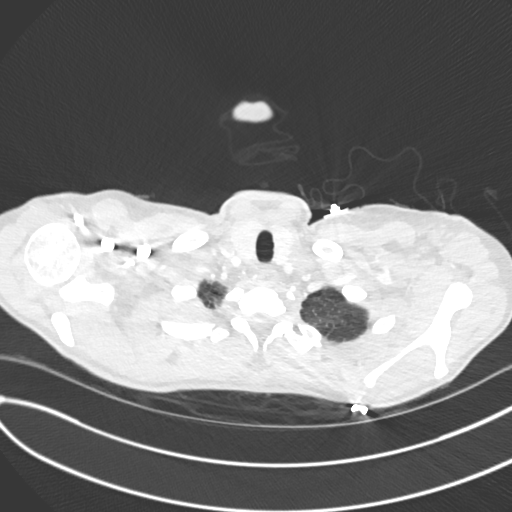
[im 375/395  mediastinal]
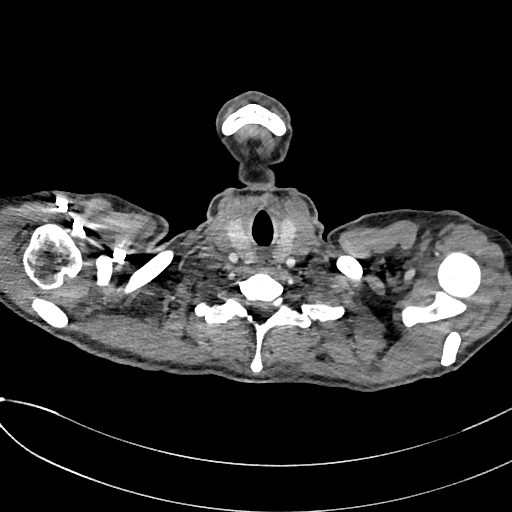

[Series 9: pe 2mm cor · coronal · 0.55mm/px · 1 of 108 slices shown]
[im 54/108  mediastinal]
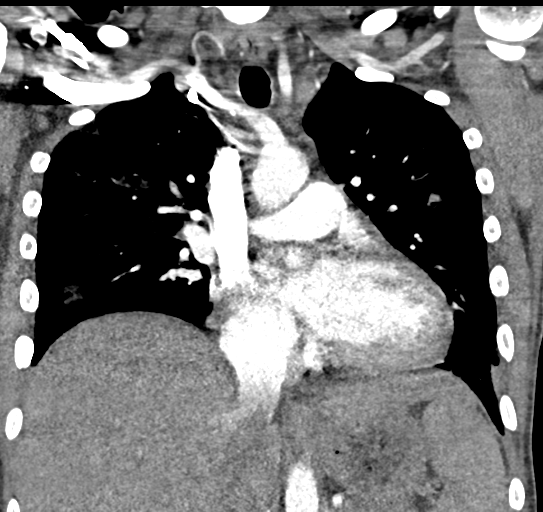

[17 of 36 positions shown; findings below may reference images not displayed]

FINDINGS: Vascular Findings:

There is adequate opacification of the pulmonary arterial system
with the main pulmonary artery measuring 274 Hounsfield units.

There is a moderate-sized occlusive filling defect involving near
the entirety of the right lower lobe pulmonary artery (axial image
67, series 6; coronal image 65, series 9) with preserved
opacification of the right anterior and medial basilar segmental
pulmonary arteries but with non opacification of the right posterior
and lateral segmental arteries.

There is an addition small occlusive filling defect involving the
posterior basilar segmental artery of the left lower lobe (axial
image 83, series 6; coronal image 84, series 9).

Normal caliber of the main pulmonary artery. There is no definitive
intraventricular septal bowing to suggest right-sided heart failure.

Cardiomegaly.  No pericardial effusion.

No evidence of thoracic aortic aneurysm or dissection on this
nongated examination. Conventional configuration of the aortic arch.

Review of the MIP images confirms the above findings.

----------------------------------------------------------------------------------

Nonvascular Findings:

Mediastinum/Lymph Nodes: Ill-defined soft tissue within the anterior
mediastinum without associated mass effect and favored to represent
residual thymic tissue. No definitive bulky mediastinal or hilar
adenopathy though evaluation degraded secondary to lack mediastinal
fat. No axillary lymphadenopathy.

Lungs/Pleura: Bilateral nodular consolidative airspace opacities,
worse within the right lower lobe with several of the nodular
consolidative opacities demonstrating central cavitation worrisome
for multifocal pneumonia, suggestive of septic pulmonary emboli.
Trace bilateral pleural effusions. Minimal biapical paraseptal
emphysematous change. Scattered air bronchograms are seen within the
perihilar consolidation of the right lower lobe however the central
pulmonary airways remain patent. No pneumothorax.

Upper abdomen: Limited early arterial phase evaluation of the upper
abdomen is unremarkable.

Musculoskeletal: No acute or aggressive osseous abnormalities.
Regional soft tissues appear normal. Normal appearance of the
thyroid gland.
IMPRESSION: 1. Bilateral nodular consolidative opacities, many of which
demonstrate central cavitation worrisome for multifocal infection,
specifically, septic pulmonary emboli. As this finding is associated
with cardiomegaly, further evaluation with cardiac echo and/or JUMPER
could be performed as indicated.
2. The examination is positive for pulmonary embolism involving the
right lower lobe pulmonary artery as well as the left posterior
basilar segmental pulmonary artery. Overall clot burden is deemed
small in volume and there is no CT evidence of right-sided heart
strain.
3.  Emphysema ([id number]).

## 2023-09-02 ENCOUNTER — Other Ambulatory Visit (HOSPITAL_BASED_OUTPATIENT_CLINIC_OR_DEPARTMENT_OTHER): Payer: Self-pay
